# Patient Record
Sex: Female | Born: 1950 | Race: White | Hispanic: No | Marital: Married | Smoking: Never smoker
Health system: Southern US, Community
[De-identification: ages and names within clinical notes are randomized; demographics above are authoritative.]

## PROBLEM LIST (undated history)

## (undated) DIAGNOSIS — J189 Pneumonia, unspecified organism: Secondary | ICD-10-CM

## (undated) DIAGNOSIS — C801 Malignant (primary) neoplasm, unspecified: Secondary | ICD-10-CM

## (undated) DIAGNOSIS — D701 Agranulocytosis secondary to cancer chemotherapy: Secondary | ICD-10-CM

## (undated) DIAGNOSIS — I10 Essential (primary) hypertension: Secondary | ICD-10-CM

## (undated) DIAGNOSIS — E669 Obesity, unspecified: Secondary | ICD-10-CM

## (undated) DIAGNOSIS — C569 Malignant neoplasm of unspecified ovary: Secondary | ICD-10-CM

## (undated) DIAGNOSIS — J45909 Unspecified asthma, uncomplicated: Secondary | ICD-10-CM

## (undated) DIAGNOSIS — Z95828 Presence of other vascular implants and grafts: Secondary | ICD-10-CM

## (undated) DIAGNOSIS — T451X5A Adverse effect of antineoplastic and immunosuppressive drugs, initial encounter: Secondary | ICD-10-CM

## (undated) DIAGNOSIS — C541 Malignant neoplasm of endometrium: Secondary | ICD-10-CM

## (undated) DIAGNOSIS — R569 Unspecified convulsions: Secondary | ICD-10-CM

## (undated) DIAGNOSIS — G629 Polyneuropathy, unspecified: Secondary | ICD-10-CM

## (undated) DIAGNOSIS — R232 Flushing: Secondary | ICD-10-CM

## (undated) DIAGNOSIS — D61818 Other pancytopenia: Secondary | ICD-10-CM

## (undated) DIAGNOSIS — D649 Anemia, unspecified: Secondary | ICD-10-CM

## (undated) DIAGNOSIS — E119 Type 2 diabetes mellitus without complications: Secondary | ICD-10-CM

## (undated) DIAGNOSIS — D6959 Other secondary thrombocytopenia: Secondary | ICD-10-CM

## (undated) DIAGNOSIS — I739 Peripheral vascular disease, unspecified: Secondary | ICD-10-CM

## (undated) HISTORY — DX: Adverse effect of antineoplastic and immunosuppressive drugs, initial encounter: D70.1

## (undated) HISTORY — DX: Malignant neoplasm of unspecified ovary: C56.9

## (undated) HISTORY — DX: Flushing: R23.2

## (undated) HISTORY — DX: Essential (primary) hypertension: I10

## (undated) HISTORY — DX: Other pancytopenia: D61.818

## (undated) HISTORY — DX: Type 2 diabetes mellitus without complications: E11.9

## (undated) HISTORY — DX: Polyneuropathy, unspecified: G62.9

## (undated) HISTORY — DX: Other secondary thrombocytopenia: D69.59

## (undated) HISTORY — DX: Obesity, unspecified: E66.9

## (undated) HISTORY — DX: Anemia, unspecified: D64.9

## (undated) HISTORY — DX: Adverse effect of antineoplastic and immunosuppressive drugs, initial encounter: T45.1X5A

## (undated) HISTORY — DX: Presence of other vascular implants and grafts: Z95.828

## (undated) HISTORY — DX: Peripheral vascular disease, unspecified: I73.9

## (undated) HISTORY — DX: Malignant (primary) neoplasm, unspecified: C80.1

## (undated) HISTORY — DX: Malignant neoplasm of endometrium: C54.1

---

## 2012-07-05 HISTORY — PX: ABDOMINAL HYSTERECTOMY: SHX81

## 2013-04-30 DIAGNOSIS — C569 Malignant neoplasm of unspecified ovary: Secondary | ICD-10-CM

## 2013-04-30 DIAGNOSIS — I10 Essential (primary) hypertension: Secondary | ICD-10-CM | POA: Insufficient documentation

## 2013-04-30 DIAGNOSIS — E119 Type 2 diabetes mellitus without complications: Secondary | ICD-10-CM | POA: Insufficient documentation

## 2013-04-30 DIAGNOSIS — D62 Acute posthemorrhagic anemia: Secondary | ICD-10-CM | POA: Insufficient documentation

## 2013-04-30 DIAGNOSIS — R109 Unspecified abdominal pain: Secondary | ICD-10-CM | POA: Insufficient documentation

## 2013-04-30 HISTORY — DX: Malignant neoplasm of unspecified ovary: C56.9

## 2013-05-07 DIAGNOSIS — E669 Obesity, unspecified: Secondary | ICD-10-CM | POA: Insufficient documentation

## 2013-06-11 DIAGNOSIS — C541 Malignant neoplasm of endometrium: Secondary | ICD-10-CM | POA: Insufficient documentation

## 2013-07-05 DIAGNOSIS — J189 Pneumonia, unspecified organism: Secondary | ICD-10-CM

## 2013-07-05 HISTORY — DX: Pneumonia, unspecified organism: J18.9

## 2013-08-24 DIAGNOSIS — T451X5A Adverse effect of antineoplastic and immunosuppressive drugs, initial encounter: Secondary | ICD-10-CM

## 2013-08-24 DIAGNOSIS — D701 Agranulocytosis secondary to cancer chemotherapy: Secondary | ICD-10-CM | POA: Insufficient documentation

## 2013-12-04 DIAGNOSIS — D6959 Other secondary thrombocytopenia: Secondary | ICD-10-CM | POA: Insufficient documentation

## 2013-12-04 DIAGNOSIS — T50905A Adverse effect of unspecified drugs, medicaments and biological substances, initial encounter: Secondary | ICD-10-CM

## 2013-12-04 DIAGNOSIS — G629 Polyneuropathy, unspecified: Secondary | ICD-10-CM | POA: Insufficient documentation

## 2014-04-18 DIAGNOSIS — Z95828 Presence of other vascular implants and grafts: Secondary | ICD-10-CM | POA: Insufficient documentation

## 2014-12-09 DIAGNOSIS — D61818 Other pancytopenia: Secondary | ICD-10-CM | POA: Insufficient documentation

## 2015-03-11 DIAGNOSIS — IMO0001 Reserved for inherently not codable concepts without codable children: Secondary | ICD-10-CM | POA: Insufficient documentation

## 2015-04-10 DIAGNOSIS — Z23 Encounter for immunization: Secondary | ICD-10-CM | POA: Diagnosis not present

## 2015-04-10 DIAGNOSIS — E119 Type 2 diabetes mellitus without complications: Secondary | ICD-10-CM | POA: Diagnosis not present

## 2015-04-10 DIAGNOSIS — E559 Vitamin D deficiency, unspecified: Secondary | ICD-10-CM | POA: Diagnosis not present

## 2015-04-10 DIAGNOSIS — I10 Essential (primary) hypertension: Secondary | ICD-10-CM | POA: Diagnosis not present

## 2015-04-10 DIAGNOSIS — C569 Malignant neoplasm of unspecified ovary: Secondary | ICD-10-CM | POA: Diagnosis not present

## 2015-04-10 DIAGNOSIS — E782 Mixed hyperlipidemia: Secondary | ICD-10-CM | POA: Diagnosis not present

## 2015-04-10 DIAGNOSIS — C541 Malignant neoplasm of endometrium: Secondary | ICD-10-CM | POA: Diagnosis not present

## 2015-04-10 DIAGNOSIS — R5382 Chronic fatigue, unspecified: Secondary | ICD-10-CM | POA: Diagnosis not present

## 2015-04-10 DIAGNOSIS — G629 Polyneuropathy, unspecified: Secondary | ICD-10-CM | POA: Diagnosis not present

## 2015-04-22 DIAGNOSIS — Z95828 Presence of other vascular implants and grafts: Secondary | ICD-10-CM | POA: Diagnosis not present

## 2015-06-10 DIAGNOSIS — C569 Malignant neoplasm of unspecified ovary: Secondary | ICD-10-CM | POA: Diagnosis not present

## 2015-06-10 DIAGNOSIS — M25551 Pain in right hip: Secondary | ICD-10-CM | POA: Diagnosis not present

## 2015-06-10 DIAGNOSIS — G8929 Other chronic pain: Secondary | ICD-10-CM | POA: Diagnosis not present

## 2015-06-10 DIAGNOSIS — C541 Malignant neoplasm of endometrium: Secondary | ICD-10-CM | POA: Diagnosis not present

## 2015-07-22 DIAGNOSIS — Z95828 Presence of other vascular implants and grafts: Secondary | ICD-10-CM | POA: Diagnosis not present

## 2015-08-28 DIAGNOSIS — C569 Malignant neoplasm of unspecified ovary: Secondary | ICD-10-CM | POA: Diagnosis not present

## 2015-08-28 DIAGNOSIS — C541 Malignant neoplasm of endometrium: Secondary | ICD-10-CM | POA: Diagnosis not present

## 2015-09-02 DIAGNOSIS — E1165 Type 2 diabetes mellitus with hyperglycemia: Secondary | ICD-10-CM | POA: Diagnosis not present

## 2015-09-02 DIAGNOSIS — E1142 Type 2 diabetes mellitus with diabetic polyneuropathy: Secondary | ICD-10-CM | POA: Diagnosis not present

## 2015-09-02 DIAGNOSIS — Z95828 Presence of other vascular implants and grafts: Secondary | ICD-10-CM | POA: Diagnosis not present

## 2015-09-02 DIAGNOSIS — D6959 Other secondary thrombocytopenia: Secondary | ICD-10-CM | POA: Diagnosis not present

## 2015-09-02 DIAGNOSIS — T451X5A Adverse effect of antineoplastic and immunosuppressive drugs, initial encounter: Secondary | ICD-10-CM | POA: Diagnosis not present

## 2015-09-02 DIAGNOSIS — C541 Malignant neoplasm of endometrium: Secondary | ICD-10-CM | POA: Diagnosis not present

## 2015-09-02 DIAGNOSIS — G6289 Other specified polyneuropathies: Secondary | ICD-10-CM | POA: Diagnosis not present

## 2015-09-02 DIAGNOSIS — C569 Malignant neoplasm of unspecified ovary: Secondary | ICD-10-CM | POA: Diagnosis not present

## 2015-09-24 DIAGNOSIS — E119 Type 2 diabetes mellitus without complications: Secondary | ICD-10-CM | POA: Diagnosis not present

## 2015-09-24 DIAGNOSIS — I10 Essential (primary) hypertension: Secondary | ICD-10-CM | POA: Diagnosis not present

## 2015-09-24 DIAGNOSIS — C541 Malignant neoplasm of endometrium: Secondary | ICD-10-CM | POA: Diagnosis not present

## 2015-09-24 DIAGNOSIS — E782 Mixed hyperlipidemia: Secondary | ICD-10-CM | POA: Diagnosis not present

## 2015-09-30 DIAGNOSIS — C569 Malignant neoplasm of unspecified ovary: Secondary | ICD-10-CM | POA: Diagnosis not present

## 2015-09-30 DIAGNOSIS — E782 Mixed hyperlipidemia: Secondary | ICD-10-CM | POA: Diagnosis not present

## 2015-09-30 DIAGNOSIS — C541 Malignant neoplasm of endometrium: Secondary | ICD-10-CM | POA: Diagnosis not present

## 2015-09-30 DIAGNOSIS — N182 Chronic kidney disease, stage 2 (mild): Secondary | ICD-10-CM | POA: Diagnosis not present

## 2015-09-30 DIAGNOSIS — E1165 Type 2 diabetes mellitus with hyperglycemia: Secondary | ICD-10-CM | POA: Diagnosis not present

## 2015-09-30 DIAGNOSIS — E1122 Type 2 diabetes mellitus with diabetic chronic kidney disease: Secondary | ICD-10-CM | POA: Diagnosis not present

## 2015-09-30 DIAGNOSIS — E1143 Type 2 diabetes mellitus with diabetic autonomic (poly)neuropathy: Secondary | ICD-10-CM | POA: Diagnosis not present

## 2015-10-14 DIAGNOSIS — Z95828 Presence of other vascular implants and grafts: Secondary | ICD-10-CM | POA: Diagnosis not present

## 2015-11-27 DIAGNOSIS — C569 Malignant neoplasm of unspecified ovary: Secondary | ICD-10-CM | POA: Diagnosis not present

## 2015-12-08 DIAGNOSIS — E1165 Type 2 diabetes mellitus with hyperglycemia: Secondary | ICD-10-CM | POA: Diagnosis not present

## 2015-12-08 DIAGNOSIS — Z452 Encounter for adjustment and management of vascular access device: Secondary | ICD-10-CM | POA: Diagnosis not present

## 2015-12-08 DIAGNOSIS — C541 Malignant neoplasm of endometrium: Secondary | ICD-10-CM | POA: Diagnosis not present

## 2015-12-08 DIAGNOSIS — C569 Malignant neoplasm of unspecified ovary: Secondary | ICD-10-CM | POA: Diagnosis not present

## 2015-12-08 DIAGNOSIS — G620 Drug-induced polyneuropathy: Secondary | ICD-10-CM | POA: Diagnosis not present

## 2015-12-08 DIAGNOSIS — R2231 Localized swelling, mass and lump, right upper limb: Secondary | ICD-10-CM | POA: Diagnosis not present

## 2015-12-08 DIAGNOSIS — Z95828 Presence of other vascular implants and grafts: Secondary | ICD-10-CM | POA: Diagnosis not present

## 2015-12-08 DIAGNOSIS — E1142 Type 2 diabetes mellitus with diabetic polyneuropathy: Secondary | ICD-10-CM | POA: Diagnosis not present

## 2015-12-08 DIAGNOSIS — R2230 Localized swelling, mass and lump, unspecified upper limb: Secondary | ICD-10-CM | POA: Insufficient documentation

## 2015-12-17 DIAGNOSIS — C541 Malignant neoplasm of endometrium: Secondary | ICD-10-CM | POA: Diagnosis not present

## 2015-12-17 DIAGNOSIS — N6489 Other specified disorders of breast: Secondary | ICD-10-CM | POA: Diagnosis not present

## 2015-12-17 DIAGNOSIS — C569 Malignant neoplasm of unspecified ovary: Secondary | ICD-10-CM | POA: Diagnosis not present

## 2015-12-17 DIAGNOSIS — N63 Unspecified lump in breast: Secondary | ICD-10-CM | POA: Diagnosis not present

## 2016-01-15 ENCOUNTER — Encounter (HOSPITAL_COMMUNITY): Payer: Medicare HMO | Attending: Hematology | Admitting: Hematology

## 2016-01-15 ENCOUNTER — Encounter (HOSPITAL_COMMUNITY): Payer: Self-pay | Admitting: Hematology

## 2016-01-15 VITALS — BP 155/55 | HR 96 | Temp 98.2°F | Resp 18 | Wt 251.6 lb

## 2016-01-15 DIAGNOSIS — G620 Drug-induced polyneuropathy: Secondary | ICD-10-CM

## 2016-01-15 DIAGNOSIS — C541 Malignant neoplasm of endometrium: Secondary | ICD-10-CM | POA: Diagnosis not present

## 2016-01-15 DIAGNOSIS — Z95828 Presence of other vascular implants and grafts: Secondary | ICD-10-CM

## 2016-01-15 DIAGNOSIS — C569 Malignant neoplasm of unspecified ovary: Secondary | ICD-10-CM

## 2016-01-15 DIAGNOSIS — T451X5A Adverse effect of antineoplastic and immunosuppressive drugs, initial encounter: Secondary | ICD-10-CM

## 2016-01-15 DIAGNOSIS — Z452 Encounter for adjustment and management of vascular access device: Secondary | ICD-10-CM | POA: Diagnosis not present

## 2016-01-15 MED ORDER — HEPARIN SOD (PORK) LOCK FLUSH 100 UNIT/ML IV SOLN
500.0000 [IU] | Freq: Once | INTRAVENOUS | Status: AC
  Administered 2016-01-15: 500 [IU] via INTRAVENOUS
  Filled 2016-01-15: qty 5

## 2016-01-15 MED ORDER — SODIUM CHLORIDE 0.9% FLUSH
10.0000 mL | INTRAVENOUS | Status: DC | PRN
  Administered 2016-01-15: 10 mL via INTRAVENOUS
  Filled 2016-01-15: qty 10

## 2016-01-15 NOTE — Patient Instructions (Signed)
[place] at [facility] Discharge Instructions  RECOMMENDATIONS MADE BY THE CONSULTANT AND ANY TEST RESULTS WILL BE SENT TO YOUR REFERRING PHYSICIAN.  You were seen by Dr. Irene Limbo today. You will Return to center in 6 to 8 weeks. PET scan in 6 weeks Labs in 6 weeks Port Flush in 6 weeks [employer] today. Please call the center with any concerns.  Thank you for choosing [place] at [facility] to provide your oncology and hematology care.  To afford each patient quality time with our provider, please arrive at least 15 minutes before your scheduled appointment time.   Beginning January 23rd 2017 lab work for the [facility] will be done in the  Main lab at [facility] on 1st floor. If you have a lab appointment with the [facility] please come in thru the  Main Entrance and check in at the main information desk  You need to re-schedule your appointment should you arrive 10 or more minutes late.  We strive to give you quality time with our providers, and arriving late affects you and other patients whose appointments are after yours.  Also, if you no show three or more times for appointments you may be dismissed from the clinic at the providers discretion.     Again, thank you for choosing [facility].  Our hope is that these requests will decrease the amount of time that you wait before being seen by our physicians.       _____________________________________________________________  Should you have questions after your visit to [facility], please contact our office at [phone] between the hours of 8:30 a.m. and 4:30 p.m.  Voicemails left after 4:30 p.m. will not be returned until the following business day.  For prescription refill requests, have your pharmacy contact our office.         Resources For Cancer Patients and their Caregivers ? American Cancer Society: Can assist with transportation, wigs,  general needs, runs Look Good Feel Better.        ([phone] 816-5178 ? Cancer Care: Provides financial assistance, online support groups, medication/co-pay assistance.  1-800-813-HOPE 4452404229) ? [place] Assists [place] cancer patients and their families through emotional , educational and financial support.  8560347402 ? [place] DSS Where to apply for food stamps, Medicaid and utility assistance. 684 317 7282 ? RCATS: Transportation to medical appointments. (574)499-6555 ? Social Security Administration: May apply for disability if have a Stage IV cancer. (785) 203-6738 610 531 7315 ? [employer], Disability and Transit Services: Assists with nutrition, care and transit needs. [place] Support Programs: @10RELATIVEDAYS @ > Cancer Support Group  2nd Tuesday of the month 1pm-2pm, Journey Room  > Creative Journey  3rd Tuesday of the month 1130am-1pm, Journey Room  > Look Good Feel Better  1st Wednesday of the month 10am-12 noon, Journey Room (Call [place] to register [phone])

## 2016-01-15 NOTE — Progress Notes (Signed)
Kathleen Whitehead presented for Portacath access and flush. Portacath located in the right chest wall accessed with  H 20 needle. Clean, dry and intact No blood return noted. Pt states recent appointments has had no blood return per port. Flushed well, no resistance.  Portacath flushed with 51ml NS and 500U/16ml Heparin per protocol and needle removed intact. Procedure without incident. Patient tolerated procedure well.

## 2016-01-23 DIAGNOSIS — N182 Chronic kidney disease, stage 2 (mild): Secondary | ICD-10-CM | POA: Diagnosis not present

## 2016-01-23 DIAGNOSIS — E782 Mixed hyperlipidemia: Secondary | ICD-10-CM | POA: Diagnosis not present

## 2016-01-23 DIAGNOSIS — E1143 Type 2 diabetes mellitus with diabetic autonomic (poly)neuropathy: Secondary | ICD-10-CM | POA: Diagnosis not present

## 2016-01-23 DIAGNOSIS — E1122 Type 2 diabetes mellitus with diabetic chronic kidney disease: Secondary | ICD-10-CM | POA: Diagnosis not present

## 2016-01-23 DIAGNOSIS — I10 Essential (primary) hypertension: Secondary | ICD-10-CM | POA: Diagnosis not present

## 2016-01-28 DIAGNOSIS — C569 Malignant neoplasm of unspecified ovary: Secondary | ICD-10-CM | POA: Diagnosis not present

## 2016-01-28 DIAGNOSIS — E782 Mixed hyperlipidemia: Secondary | ICD-10-CM | POA: Diagnosis not present

## 2016-01-28 DIAGNOSIS — C541 Malignant neoplasm of endometrium: Secondary | ICD-10-CM | POA: Diagnosis not present

## 2016-01-28 DIAGNOSIS — E1165 Type 2 diabetes mellitus with hyperglycemia: Secondary | ICD-10-CM | POA: Diagnosis not present

## 2016-01-28 DIAGNOSIS — Z6838 Body mass index (BMI) 38.0-38.9, adult: Secondary | ICD-10-CM | POA: Diagnosis not present

## 2016-01-28 DIAGNOSIS — E1143 Type 2 diabetes mellitus with diabetic autonomic (poly)neuropathy: Secondary | ICD-10-CM | POA: Diagnosis not present

## 2016-01-28 DIAGNOSIS — E1122 Type 2 diabetes mellitus with diabetic chronic kidney disease: Secondary | ICD-10-CM | POA: Diagnosis not present

## 2016-02-03 ENCOUNTER — Telehealth (HOSPITAL_COMMUNITY): Payer: Self-pay | Admitting: Oncology

## 2016-02-03 DIAGNOSIS — C569 Malignant neoplasm of unspecified ovary: Secondary | ICD-10-CM

## 2016-02-03 MED ORDER — OXYCODONE-ACETAMINOPHEN 10-325 MG PO TABS: 1.0000 | ORAL_TABLET | ORAL | 0 refills | Status: DC | PRN

## 2016-02-03 NOTE — Telephone Encounter (Signed)
Patient is calling requesting a refill of her Percocet.  Using the [place] Controlled Substance Reporting System, I have looked up her pain medication needs.  She has been receiving #180 of Percocet 10-325 per month.  Her last refill was on 01/05/2016.  She is due for a refill.    HOWEVER, #180 is a large quantity and therefore, when she is seen here at [facility] to establish her care, we will need to discuss her pain medication needs and regimen.  I feel uneasy about prescribing such large quantities, particularly in light of no known long-acting pain medication as part of her regimen.  Robynn Pane, PA-C 02/03/2016 5:12 PM

## 2016-02-25 ENCOUNTER — Encounter (HOSPITAL_COMMUNITY): Payer: Medicare HMO

## 2016-02-27 ENCOUNTER — Encounter (HOSPITAL_COMMUNITY): Payer: Self-pay

## 2016-02-27 ENCOUNTER — Encounter (HOSPITAL_COMMUNITY): Payer: Medicare HMO | Attending: Hematology

## 2016-02-27 ENCOUNTER — Ambulatory Visit (HOSPITAL_COMMUNITY): Payer: Medicare HMO | Admitting: Hematology & Oncology

## 2016-02-27 VITALS — BP 154/58 | HR 96 | Temp 97.8°F | Resp 18

## 2016-02-27 DIAGNOSIS — C541 Malignant neoplasm of endometrium: Secondary | ICD-10-CM | POA: Diagnosis not present

## 2016-02-27 DIAGNOSIS — Z95828 Presence of other vascular implants and grafts: Secondary | ICD-10-CM | POA: Diagnosis not present

## 2016-02-27 DIAGNOSIS — C569 Malignant neoplasm of unspecified ovary: Secondary | ICD-10-CM | POA: Insufficient documentation

## 2016-02-27 LAB — CBC WITH DIFFERENTIAL/PLATELET
BASOS PCT: 0 %
Basophils Absolute: 0 10*3/uL (ref 0.0–0.1)
EOS ABS: 0.1 10*3/uL (ref 0.0–0.7)
Eosinophils Relative: 1 %
HEMATOCRIT: 39.1 % (ref 36.0–46.0)
HEMOGLOBIN: 13.3 g/dL (ref 12.0–15.0)
LYMPHS ABS: 2.5 10*3/uL (ref 0.7–4.0)
Lymphocytes Relative: 36 %
MCH: 31.8 pg (ref 26.0–34.0)
MCHC: 34 g/dL (ref 30.0–36.0)
MCV: 93.5 fL (ref 78.0–100.0)
MONO ABS: 0.5 10*3/uL (ref 0.1–1.0)
MONOS PCT: 7 %
NEUTROS PCT: 56 %
Neutro Abs: 3.9 10*3/uL (ref 1.7–7.7)
Platelets: 158 10*3/uL (ref 150–400)
RBC: 4.18 MIL/uL (ref 3.87–5.11)
RDW: 12.7 % (ref 11.5–15.5)
WBC: 7 10*3/uL (ref 4.0–10.5)

## 2016-02-27 LAB — COMPREHENSIVE METABOLIC PANEL
ALK PHOS: 60 U/L (ref 38–126)
ALT: 21 U/L (ref 14–54)
ANION GAP: 8 (ref 5–15)
AST: 26 U/L (ref 15–41)
Albumin: 3.3 g/dL — ABNORMAL LOW (ref 3.5–5.0)
BILIRUBIN TOTAL: 0.4 mg/dL (ref 0.3–1.2)
BUN: 17 mg/dL (ref 6–20)
CALCIUM: 9 mg/dL (ref 8.9–10.3)
CO2: 24 mmol/L (ref 22–32)
Chloride: 105 mmol/L (ref 101–111)
Creatinine, Ser: 1.07 mg/dL — ABNORMAL HIGH (ref 0.44–1.00)
GFR calc non Af Amer: 53 mL/min — ABNORMAL LOW (ref >60)
Glucose, Bld: 116 mg/dL — ABNORMAL HIGH (ref 65–99)
POTASSIUM: 4.1 mmol/L (ref 3.5–5.1)
SODIUM: 137 mmol/L (ref 135–145)
TOTAL PROTEIN: 6.7 g/dL (ref 6.5–8.1)

## 2016-02-27 MED ORDER — HEPARIN SOD (PORK) LOCK FLUSH 100 UNIT/ML IV SOLN
500.0000 [IU] | Freq: Once | INTRAVENOUS | Status: AC
  Administered 2016-02-27: 500 [IU] via INTRAVENOUS

## 2016-02-27 MED ORDER — SODIUM CHLORIDE 0.9% FLUSH
10.0000 mL | INTRAVENOUS | Status: DC | PRN
  Administered 2016-02-27: 10 mL via INTRAVENOUS
  Filled 2016-02-27: qty 10

## 2016-02-27 NOTE — Patient Instructions (Signed)
[place] at [facility] Discharge Instructions  RECOMMENDATIONS MADE BY THE CONSULTANT AND ANY TEST RESULTS WILL BE SENT TO YOUR REFERRING PHYSICIAN.  You got your port flushed today and labs drawn. Follow up as scheduled.  Thank you for choosing [place] at [facility] to provide your oncology and hematology care.  To afford each patient quality time with our provider, please arrive at least 15 minutes before your scheduled appointment time.   Beginning January 23rd 2017 lab work for the [facility] will be done in the  Main lab at [facility] on 1st floor. If you have a lab appointment with the [place] please come in thru the  Main Entrance and check in at the main information desk  You need to re-schedule your appointment should you arrive 10 or more minutes late.  We strive to give you quality time with our providers, and arriving late affects you and other patients whose appointments are after yours.  Also, if you no show three or more times for appointments you may be dismissed from the clinic at the providers discretion.     Again, thank you for choosing [facility].  Our hope is that these requests will decrease the amount of time that you wait before being seen by our physicians.       _____________________________________________________________  Should you have questions after your visit to [facility], please contact our office at [phone] between the hours of 8:30 a.m. and 4:30 p.m.  Voicemails left after 4:30 p.m. will not be returned until the following business day.  For prescription refill requests, have your pharmacy contact our office.         Resources For Cancer Patients and their Caregivers ? American Cancer Society: Can assist with transportation, wigs, general needs, runs Look Good Feel Better.        828-625-5119 ? Cancer Care: Provides financial assistance, online  support groups, medication/co-pay assistance.  1-800-813-HOPE 669-860-3170) ? [place] Assists [place] cancer patients and their families through emotional , educational and financial support.  813-888-5272 ? [place] DSS Where to apply for food stamps, Medicaid and utility assistance. 470 166 2000 ? RCATS: Transportation to medical appointments. (503)510-8310 ? Social Security Administration: May apply for disability if have a Stage IV cancer. 272-287-8844 802-829-3443 ? [employer], Disability and Transit Services: Assists with nutrition, care and transit needs. [place] Support Programs: @10RELATIVEDAYS @ > Cancer Support Group  2nd Tuesday of the month 1pm-2pm, Journey Room  > Creative Journey  3rd Tuesday of the month 1130am-1pm, Journey Room  > Look Good Feel Better  1st Wednesday of the month 10am-12 noon, Journey Room (Call [place] to register [phone])

## 2016-02-27 NOTE — Progress Notes (Signed)
Kathleen Whitehead presented for Portacath access and flush. Portacath located rightchest wall accessed with  H 20 needle. Good blood return present. Portacath flushed with 6ml NS and 500U/52ml Heparin and needle removed intact. Procedure without incident. Patient tolerated procedure well.  Labs drawn as ordered.

## 2016-02-28 LAB — CA 125: CA 125: 19.7 U/mL (ref 0.0–38.1)

## 2016-03-02 ENCOUNTER — Ambulatory Visit (HOSPITAL_COMMUNITY): Payer: Medicare HMO | Admitting: Hematology & Oncology

## 2016-03-02 ENCOUNTER — Encounter (HOSPITAL_BASED_OUTPATIENT_CLINIC_OR_DEPARTMENT_OTHER): Payer: Medicare HMO | Admitting: Oncology

## 2016-03-02 ENCOUNTER — Encounter (HOSPITAL_COMMUNITY): Payer: Self-pay | Admitting: Oncology

## 2016-03-02 VITALS — BP 152/52 | HR 81 | Temp 98.3°F | Resp 20 | Ht 69.0 in | Wt 249.0 lb

## 2016-03-02 DIAGNOSIS — C569 Malignant neoplasm of unspecified ovary: Secondary | ICD-10-CM

## 2016-03-02 MED ORDER — OXYCODONE HCL ER 20 MG PO T12A: 20.0000 mg | EXTENDED_RELEASE_TABLET | Freq: Two times a day (BID) | ORAL | 0 refills | Status: DC

## 2016-03-02 MED ORDER — OXYCODONE-ACETAMINOPHEN 10-325 MG PO TABS: 1.0000 | ORAL_TABLET | ORAL | 0 refills | Status: DC | PRN

## 2016-03-02 NOTE — Patient Instructions (Signed)
[place] at [facility] Discharge Instructions  RECOMMENDATIONS MADE BY THE CONSULTANT AND ANY TEST RESULTS WILL BE SENT TO YOUR REFERRING PHYSICIAN.  You were seen by Kirby Crigler PA-C today. He has given you a prescription for both percocet and Oxycontin. We will see you back in about 6 weeks for a port flush and then every 6 weeks for a port flush and labs. Return in 3 months for follow up .   Thank you for choosing [place] at [facility] to provide your oncology and hematology care.  To afford each patient quality time with our provider, please arrive at least 15 minutes before your scheduled appointment time.   Beginning January 23rd 2017 lab work for the [facility] will be done in the  Main lab at [facility] on 1st floor. If you have a lab appointment with the [place] please come in thru the  Main Entrance and check in at the main information desk  You need to re-schedule your appointment should you arrive 10 or more minutes late.  We strive to give you quality time with our providers, and arriving late affects you and other patients whose appointments are after yours.  Also, if you no show three or more times for appointments you may be dismissed from the clinic at the providers discretion.     Again, thank you for choosing [facility].  Our hope is that these requests will decrease the amount of time that you wait before being seen by our physicians.       _____________________________________________________________  Should you have questions after your visit to [facility], please contact our office at [phone] between the hours of 8:30 a.m. and 4:30 p.m.  Voicemails left after 4:30 p.m. will not be returned until the following business day.  For prescription refill requests, have your pharmacy contact our office.         Resources For Cancer Patients and their Caregivers ? American  Cancer Society: Can assist with transportation, wigs, general needs, runs Look Good Feel Better.        ([phone] ? Cancer Care: Provides financial assistance, online support groups, medication/co-pay assistance.  1-800-813-HOPE 605 272 9609) ? [place] Assists [place] cancer patients and their families through emotional , educational and financial support.  (337) 491-2296 ? [place] DSS Where to apply for food stamps, Medicaid and utility assistance. 914-629-2621 ? RCATS: Transportation to medical appointments. 407-137-6746 ? Social Security Administration: May apply for disability if have a Stage IV cancer. 475-706-6620 319-461-7423 ? [employer], Disability and Transit Services: Assists with nutrition, care and transit needs. [place] Support Programs: @10RELATIVEDAYS @ > Cancer Support Group  2nd Tuesday of the month 1pm-2pm, Journey Room  > Creative Journey  3rd Tuesday of the month 1130am-1pm, Journey Room  > Look Good Feel Better  1st Wednesday of the month 10am-12 noon, Journey Room (Call [place] to register [phone])

## 2016-03-02 NOTE — Assessment & Plan Note (Addendum)
Ovarian Cancer/Endometrial cancer, S/P TAH-BSO by Dr. [name] with unsure pathology regarding one primary with metastatic disease versus two separate primaries followed by Carboplatin/Taxol x 5/6 cycles (last cycle cancelled due to intolerance and thrombocytopenia), followed by EBRT and intravaginal brachytherapy.  She did have a recurrence in October 2015 on PET imaging requiring another treatment with EBRT.  Finishing all therapy on 07/15/2014.  Pre-operative CA 125 was elevated.  Oncology history developed.  Patient originally seen by Dr. Irene Limbo at time of medical oncology transfer of care in July 2017.  At this time, his note is not yet complete.  Labs performed on 02/27/2016: CBC diff, CMET, CA 125.  I personally reviewed and went over laboratory results with the patient.  The results are noted within this dictation.  I have recommended a referral to [facility], but she declines.    She is NOT up to date on her colonoscopy by the patient's own admission.  She will be referred to survivorship program as well and we will share in the patient's surveillance.  Labs in 3 months: CBC diff, CMET, CA 125.  Imaging will be ordered if/when clinically indicated.  We discussed her pain medication needs.  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  She has been maintained on #180 Percocet by Dr. Tressie Stalker.  [place] Controlled Substance Reporting System is reviewed regarding her controlled substance prescribing.  She will be due next week for a refill on her pain medication.  I have reviewed long-acting pain medication.  She is provided education regarding the role of a long-acting medication.  Since she has been on Oxycodone based therapy for some time, we will try to get Oxycontin approved for her.  Rx is printed for Oxycontin 20 mg BID.  The goal of therapy is to decrease her short-acting pain medication needs.  Return in 3 months for follow-up.

## 2016-03-02 NOTE — Progress Notes (Signed)
Kathleen Labrum, MD [place] [address]  Malignant neoplasm of ovary, unspecified laterality ([place]) - Plan: glipiZIDE (GLUCOTROL) 10 MG tablet, VICTOZA 18 MG/3ML SOPN, oxyCODONE-acetaminophen (PERCOCET) 10-325 MG tablet, oxyCODONE (OXYCONTIN) 20 mg 12 hr tablet, CBC with Differential, Comprehensive metabolic panel, CA 0000000  CURRENT THERAPY: Surveillance per NCCN guidelines  INTERVAL HISTORY: Kathleen Whitehead 65 y.o. female returns for followup of Ovarian Cancer/Endometrial cancer, S/P TAH-BSO by Dr. [name] with unsure pathology regarding one primary with metastatic disease versus two separate primaries followed by Carboplatin/Taxol x 5/6 cycles (last cycle cancelled due to intolerance and thrombocytopenia), followed by EBRT and intravaginal brachytherapy.  She did have a recurrence in October 2015 on PET imaging requiring another treatment with EBRT.  Finishing all therapy on 07/15/2014.  Pre-operative CA 125 was elevated.     Malignant neoplasm of ovary ([place])   04/30/2013 Initial Diagnosis    Ovarian cancer/uterine cancer status post TAH-BSO by Dr. [name] and associates, pathology could not be certain that these were not 2 separate primaries rather than one metastatic process from the uterus to the ovary.      08/15/2013 - 12/06/2013 Chemotherapy    Carboplatin/Taxol with dose reduction of 50% and Taxol due to grade 2+ peripheral neuropathy and gabapentin induced nightmare/hallucinations. 5 out of 6 cycles given with the final cycle being cancelled secondary to significant thrombocytopenia/intolerance.      09/10/2013 - 10/17/2013 Radiation Therapy    5040 cGy delivered by EBRT followed by intravaginal brachytherapy.      04/30/2014 PET scan    PET scan consistent with recurrent disease of either ovarian cancer or endometrial cancer.      06/03/2014 - 07/15/2014 Radiation Therapy    5040 cGy over 28 fractions delivered to the low periaortic area lymph node  either metastatic endometrial or metastatic or ovarian cancer.      07/19/2014 Imaging    Imaging concerning for new mesenteric disease and subdiaphragmatic disease.      08/05/2014 PET scan    No evidence of progression of disease.      08/05/2014 Remission    Negative PET scan.      Today, she denies any complaints.  I have reviewed her medical oncology chart via [place].  We discussed her pain medication needs.  She is on Percocet by Dr. Tressie Stalker, but the quantity of tablets is excessive and therefore indicative of poor pain control.  She is open to learning of long-acting pain medication options.    She is not up to date on important preventative health maintenance including bone density and colonoscopy and refuses these at this time.  She does not ambulate much, not even at home.  She is very inactive at home as well.  Review of Systems  Constitutional: Negative.  Negative for chills, fever and weight loss.  HENT: Negative.   Eyes: Negative.   Respiratory: Negative.   Cardiovascular: Negative.   Gastrointestinal: Negative.  Negative for abdominal pain, nausea and vomiting.  Genitourinary: Negative.   Musculoskeletal: Negative.   Skin: Negative.   Neurological: Positive for tingling. Negative for weakness.  Endo/Heme/Allergies: Negative.   Psychiatric/Behavioral: Negative.     Past Medical History:  Diagnosis Date  . Anemia   . Chemotherapy induced neutropenia ([place])   . Chemotherapy induced thrombocytopenia   . Diabetes mellitus without complication ([place])   . Endometrial cancer ([place])   . Hot flashes   . Hypertension   . Malignant neoplasm (  [place])   . Malignant neoplasm of ovary ([place]) 04/30/2013  . Obesity   . Ovarian cancer ([place])   . Pancytopenia ([place])   . Peripheral artery disease ([place])   . Peripheral neuropathy ([place])   . Port-a-cath in place     History reviewed. No pertinent surgical history.  Family History  Problem Relation Age of Onset  . Heart  failure Mother   . Stroke Sister   . Cancer Sister   . Diabetes Sister   . Leukemia Sister   . Diabetes Brother     Social History   Social History  . Marital status: Married    Spouse name: N/A  . Number of children: N/A  . Years of education: N/A   Social History Main Topics  . Smoking status: Never Smoker  . Smokeless tobacco: Never Used  . Alcohol use No  . Drug use: No  . Sexual activity: Not Asked   Other Topics Concern  . None   Social History Narrative  . None     PHYSICAL EXAMINATION  ECOG PERFORMANCE STATUS: 2 - Symptomatic, <50% confined to bed  Vitals:   03/02/16 1320  BP: (!) 152/52  Pulse: 81  Resp: 20  Temp: 98.3 F (36.8 C)    GENERAL:alert, no distress, comfortable, cooperative, obese, smiling and accompanied by her husband SKIN: skin color, texture, turgor are normal, no rashes or significant lesions HEAD: Normocephalic, No masses, lesions, tenderness or abnormalities EYES: normal, EOMI, Conjunctiva are pink and non-injected EARS: External ears normal OROPHARYNX:lips, buccal mucosa, and tongue normal and mucous membranes are moist  NECK: supple, trachea midline LYMPH:  no palpable lymphadenopathy BREAST:not examined LUNGS: clear to auscultation  HEART: regular rate & rhythm ABDOMEN:abdomen soft, obese and normal bowel sounds BACK: Back symmetric, no curvature. EXTREMITIES:less then 2 second capillary refill, no joint deformities, effusion, or inflammation, no skin discoloration, no cyanosis  NEURO: alert & oriented x 3 with fluent speech, no focal motor/sensory deficits, in wheelchair.   LABORATORY DATA: CBC    Component Value Date/Time   WBC 7.0 02/27/2016 1500   RBC 4.18 02/27/2016 1500   HGB 13.3 02/27/2016 1500   HCT 39.1 02/27/2016 1500   PLT 158 02/27/2016 1500   MCV 93.5 02/27/2016 1500   MCH 31.8 02/27/2016 1500   MCHC 34.0 02/27/2016 1500   RDW 12.7 02/27/2016 1500   LYMPHSABS 2.5 02/27/2016 1500   MONOABS 0.5  02/27/2016 1500   EOSABS 0.1 02/27/2016 1500   BASOSABS 0.0 02/27/2016 1500      Chemistry      Component Value Date/Time   NA 137 02/27/2016 1500   K 4.1 02/27/2016 1500   CL 105 02/27/2016 1500   CO2 24 02/27/2016 1500   BUN 17 02/27/2016 1500   CREATININE 1.07 (H) 02/27/2016 1500      Component Value Date/Time   CALCIUM 9.0 02/27/2016 1500   ALKPHOS 60 02/27/2016 1500   AST 26 02/27/2016 1500   ALT 21 02/27/2016 1500   BILITOT 0.4 02/27/2016 1500     Lab Results  Component Value Date   CA125 19.7 02/27/2016      PENDING LABS:   RADIOGRAPHIC STUDIES:  No results found.   PATHOLOGY:    ASSESSMENT AND PLAN:  Malignant neoplasm of ovary (H[facility]) Ovarian Cancer/Endometrial cancer, S/P TAH-BSO by Dr. [name] with unsure pathology regarding one primary with metastatic disease versus two separate primaries followed by Carboplatin/Taxol x 5/6 cycles (last cycle cancelled due to intolerance and thrombocytopenia),  followed by EBRT and intravaginal brachytherapy.  She did have a recurrence in October 2015 on PET imaging requiring another treatment with EBRT.  Finishing all therapy on 07/15/2014.  Pre-operative CA 125 was elevated.  Oncology history developed.  Patient originally seen by Dr. Irene Limbo at time of medical oncology transfer of care in July 2017.  At this time, his note is not yet complete.  Labs performed on 02/27/2016: CBC diff, CMET, CA 125.  I personally reviewed and went over laboratory results with the patient.  The results are noted within this dictation.  I have recommended a referral to [facility] [facility], but she declines.    She is NOT up to date on her colonoscopy by the patient's own admission.  She will be referred to survivorship program as well and we will share in the patient's surveillance.  Labs in 3 months: CBC diff, CMET, CA 125.  Imaging will be ordered if/when clinically indicated.  We discussed her pain medication needs.  She uses her  Percocet for neuropathic pain.  She has been maintained on #180 Percocet by Dr. Tressie Stalker.  [place] Controlled Substance Reporting System is reviewed regarding her controlled substance prescribing.  She will be due next week for a refill on her pain medication.  I have reviewed long-acting pain medication.  She is provided education regarding the role of a long-acting medication.  Since she has been on Oxycodone based therapy for some time, we will try to get Oxycontin approved for her.  Rx is printed for Oxycontin 20 mg BID.  The goal of therapy is to decrease her short-acting pain medication needs.  Return in 3 months for follow-up.    ORDERS PLACED FOR THIS ENCOUNTER: Orders Placed This Encounter  Procedures  . CBC with Differential  . Comprehensive metabolic panel  . CA 125    MEDICATIONS PRESCRIBED THIS ENCOUNTER: Meds ordered this encounter  Medications  . glipiZIDE (GLUCOTROL) 10 MG tablet    Sig: TAKE 1 TABLET BY MOUTH TWICE A DAY FOR DIABETES    Refill:  3  . VICTOZA 18 MG/3ML SOPN  . oxyCODONE-acetaminophen (PERCOCET) 10-325 MG tablet    Sig: Take 1 tablet by mouth every 4 (four) hours as needed for pain.    Dispense:  180 tablet    Refill:  0    To be filled on 03/11/2016    Order Specific Question:   Supervising Provider    Answer:   Patrici Ranks [id number]  . oxyCODONE (OXYCONTIN) 20 mg 12 hr tablet    Sig: Take 1 tablet (20 mg total) by mouth every 12 (twelve) hours.    Dispense:  60 tablet    Refill:  0    Order Specific Question:   Supervising Provider    Answer:   Patrici Ranks [id number]    THERAPY PLAN:  NCCN guidelines for surveillance of Stage I-IV Epithelial Ovarian Cancer/ Fallopian Tube Cancer/ Primary Peritoneal Cancer in those experiencing a complete response recommends the following (1.2017):  A. Vists every 2-4 months for first 2 years, then 3-6 months for 3 years, and then annually after 5 years.  B. Physical exam including pelvic  exam  C. CA-125 or other tumor markers if initially elevated.  D. Refer for genetic risk evaluation if not previously performed.   E. CBC and chemistry profile as indicated.   F. CT Chest/Abdomen/Pelvis, MRI, PET-CT, or PET (skull base to mid-thigh) as clinically indicated.  G. Chest X-ray as indicted.   H.  Long-term wellness care.    All questions were answered. The patient knows to call the clinic with any problems, questions or concerns. We can certainly see the patient much sooner if necessary.  Patient and plan discussed with Dr. Ancil Linsey and she is in agreement with the aforementioned.   This note is electronically signed by: Doy Mince 03/02/2016 9:47 PM

## 2016-03-07 NOTE — Progress Notes (Signed)
Marland Kitchen    HEMATOLOGY/ONCOLOGY CONSULTATION NOTE  Date of Service: .01/15/2016  Patient Care Team: [name], MD as PCP - General (Family Medicine)  CHIEF COMPLAINTS/PURPOSE OF CONSULTATION:  Continue in management of ovarian cancer  HISTORY OF PRESENTING ILLNESS:   Kathleen Whitehead is a wonderful 65 y.o. female who has been referred to [place] by Dr .[name], MD Everardo All for evaluation and management of Ovarian Cancer/Uterine cancer.  Patient had initially presented to [facility] on 04/30/2013 with heavy vaginal bleeding and a large pelvic mass. No bedside examination was attempted as well as tissue biopsies on 04/10/2013. Preliminary pathology was suggestive of endometrioid adenocarcinoma. Patient subsequently had an examination under general anesthesia with biopsies and D&C on 04/27/2013 confirming the presence of grade 2 endometrial adenocarcinoma with focal clear cell features.  05/11/2013 - patient had explorative laparotomy with excision of the large pelvic mass and total abdominal hysterectomy with bilateral salpingo-oophorectomy and cancer staging biopsies.  Her tumor was sent for biotheranostics and final pathology returned Stage II Grade 2 endometrial cancer, and Stage IIC endometrioid ovarian cancer.  She was recommended and received adjuvant treatment for ovarian cancer with carboplatin plus Taxol followed and received 5 cycles of chemotherapy out of a total planned 6 cycles due to issues with intolerance, significant thrombocytopenia and neuropathy.  She was recommended and received external beam radiation therapy followed by intravaginal brachii therapy for her synchronous diagnosis of endometrial cancer.  Follow-up studies with PET/CT scan in October 2015 showed significant uptake in the aortocaval lymph nodes but no other overt disease. This was thought to represent persistence/ progression of disease either from her ovarian cancer or endometrial  cancer. CA-125 levels are within normal limits at the time.  06/03/2014 - 07/15/2014- Patient received radiation to the periaortic lymph nodes.  There was some concern for progression of new mesenteric and subdiaphragmatic disease on CT scan on 07/19/2014  However PET/CT scan done on 08/05/2014 did not show any metastatic disease and that the lesions were stable.  Patient was subsequently being monitored every 3 months with labs.  She was last seen by Dr. Abran Duke on 09/02/2015.  She was noted to have significant neuropathy thought to be worsening due to uncontrolled diabetes. She has tried gabapentin which gives her hallucinations and nightmares and makes her too sleepy. She was on Cymbalta which made her "bones hurt". Has currently been on amitriptyline and when necessary Percocet.  Notes that she is having issues with some yeast infection for which she uses corn starch. On social hot flashes.  She has been getting her port flushed every 6 weeks. Currently denies any vaginal bleeding. No new abdominal pain. No change in bowel or bladder function. No new weight loss. Has chronic left hip pain.  Her CA-125 levels have been stable in the last one on 11/27/2015 was 16.1.  ONCOLOGIC HISTORY  Ovarian cancer/Endometrial cancer  04/30/2013 Initial Diagnosis  Ovarian cancer/uterine cancer status post TAH and BSO by Dr. [name] and associates, pathology could not be certain that these were not 2 separate primaries rather than one metastatic process from the uterus to the ovary  Stage II Grade 2 endometrial cancer, and Stage IIC endometrioid ovarian cancer.  08/15/2013 - 12/06/2013 Chemotherapy  Cycle 3 of carboplatin/Taxol given today with 15% dose reduction in Taxol due to at least grade 2 peripheral neuropathy and gabapentin induced nightmares/hallucinations. 5 cycles of chemotherapy given of a planned 6, latter due to complications of significant thrombocytopenia/intolerance  09/10/2013 -  10/17/2013 Radiation  5040 cGy delivered by external beam radiation therapy followed by intravaginal brachytherapy  04/30/2014 Progression  Positive PET scan c/w recurrent disease of either ovarian ca or endometrial ca  06/03/2014 - 07/15/2014 Radiation  5040 cGy over 28 fractions delivered to the low periaortic area lymph node either metastatic endometrial or metastatic ovarian (Dr Sondra Come)  07/19/2014 Progression  new mesenteric disease and sub-diaphragmatic disease  08/05/2014 Remission  PET scan does not reveal progression of disease! There is no obvious disease seen whatsoever        MEDICAL HISTORY:  Past Medical History:  Diagnosis Date  . Anemia   . Chemotherapy induced neutropenia ([place])   . Chemotherapy induced thrombocytopenia   . Diabetes mellitus without complication ([place])   . Endometrial cancer ([place])   . Hot flashes   . Hypertension   . Malignant neoplasm ([place])   . Malignant neoplasm of ovary ([place]) 04/30/2013  . Obesity   . Ovarian cancer ([place])   . Pancytopenia ([place])   . Peripheral artery disease ([place])   . Peripheral neuropathy ([place])   . Port-a-cath in place   Significant diabetes and chemotherapy related neuropathy  SURGICAL HISTORY: History reviewed. No pertinent surgical history.  SOCIAL HISTORY: Social History   Social History  . Marital status: Married    Spouse name: N/A  . Number of children: N/A  . Years of education: N/A   Occupational History  . Not on file.   Social History Main Topics  . Smoking status: Never Smoker  . Smokeless tobacco: Never Used  . Alcohol use No  . Drug use: No  . Sexual activity: Not on file   Other Topics Concern  . Not on file   Social History Narrative  . No narrative on file    FAMILY HISTORY: Family History  Problem Relation Age of Onset  . Heart failure Mother   . Stroke Sister   . Cancer Sister   . Diabetes Sister   . Leukemia Sister   . Diabetes Brother     ALLERGIES:  is allergic to  diphenhydramine; iodinated diagnostic agents; duloxetine hcl; ibuprofen; and penicillins.  MEDICATIONS:  Current Outpatient Prescriptions  Medication Sig Dispense Refill  . acetaminophen (TYLENOL) 500 MG tablet Take by mouth.    Marland Kitchen aspirin EC 325 MG tablet     . cetirizine (ZYRTEC) 10 MG tablet Take by mouth.    Marland Kitchen lisinopril (PRINIVIL,ZESTRIL) 40 MG tablet Take by mouth.    . loperamide (IMODIUM) 2 MG capsule Take by mouth.    . metFORMIN (GLUCOPHAGE) 1000 MG tablet Take by mouth.    . Omega-3 Fatty Acids (FISH OIL) 1000 MG CAPS Take by mouth.    . ondansetron (ZOFRAN-ODT) 4 MG disintegrating tablet Take by mouth.    . polyethylene glycol powder (MIRALAX) powder Take by mouth.    Marland Kitchen glipiZIDE (GLUCOTROL) 10 MG tablet TAKE 1 TABLET BY MOUTH TWICE A DAY FOR DIABETES  3  . oxyCODONE (OXYCONTIN) 20 mg 12 hr tablet Take 1 tablet (20 mg total) by mouth every 12 (twelve) hours. 60 tablet 0  . oxyCODONE-acetaminophen (PERCOCET) 10-325 MG tablet Take 1 tablet by mouth every 4 (four) hours as needed for pain. 180 tablet 0  . VICTOZA 18 MG/3ML SOPN      Current Facility-Administered Medications  Medication Dose Route Frequency Provider Last Rate Last Dose  . sodium chloride flush (NS) 0.9 % injection 10 mL  10 mL Intravenous PRN Brunetta Genera, MD  10 mL at 01/15/16 1652    REVIEW OF SYSTEMS:    10 Point review of Systems was done is negative except as noted above.  PHYSICAL EXAMINATION: ECOG PERFORMANCE STATUS: 2 - Symptomatic, <50% confined to bed  . Vitals:   01/15/16 1612  BP: (!) 155/55  Pulse: 96  Resp: 18  Temp: 98.2 F (36.8 C)   Filed Weights   01/15/16 1612  Weight: 251 lb 9.6 oz (114.1 kg)   .There is no height or weight on file to calculate BMI.  GENERAL:alert, in no acute distress and comfortable SKIN: skin color, texture, turgor are normal, no rashes or significant lesions EYES: normal, conjunctiva are pink and non-injected, sclera clear OROPHARYNX:no exudate,  no erythema and lips, buccal mucosa, and tongue normal  NECK: supple, no JVD, thyroid normal size, non-tender, without nodularity LYMPH:  no palpable lymphadenopathy in the cervical, axillary or inguinal LUNGS: clear to auscultation with normal respiratory effort HEART: regular rate & rhythm,  no murmurs and no lower extremity edema ABDOMEN: abdomen soft, non-tender, normoactive bowel sounds , No palpable hepatosplenomegaly Musculoskeletal: no cyanosis of digits and no clubbing  PSYCH: alert & oriented x 3 with fluent speech NEURO: no focal motor deficits  LABORATORY DATA:  I have reviewed the data as listed    Laboratory Data: 05/2013  Pathology: DIAGNOSIS   1. SOFT TISSUE, PERITONEUM, EXCISION:  ACUTE INFLAMMATION AND FIBRIN.  NEGATIVE FOR TUMOR.  (RB1[MNS])   2. OVARY, RIGHT, RESECTION:  ADENOCARCINOMA, ENDOMETRIOID TYPE, EXTENSIVELY NECROTIC.  MARKED HEMORRHAGIC ADHESIONS.   FALLOPIAN TUBE, RIGHT, RESECTION:  DILATED FALLOPIAN TUBE WITH MARKED FIBRINOPURULENT INFLAMMATION  (PYOSALPINX).  NEGATIVE FOR INVASIVE TUMOR.  (ESN[MNS])   3. UTERUS:  ADENOCARCINOMA, ENDOMETRIOID TYPE, FIGO GRADE II, WITH ULCERATION AND  EXTENSIVE NECROSIS.  MARKED SEROSAL ADHESIONS.   OVARY, LEFT, RESECTION:  BENIGN-APPEARING OVARY WITH MARKED HEMORRHAGIC ADHESIONS.   FALLOPIAN TUBE, LEFT, RESECTION:  DILATED FALLOPIAN TUBE WITH PRONOUNCED FIBRINOPURULENT INFLAMMATION  (PYOSALPINX).  MARKED HEMORRHAGIC SEROSAL ADHESIONS WITH MINUTE FOCUS SUSPICIOUS FOR  TUMOR.  (ESN[MNS])   4. CERVIX AND LOWER UTERINE SEGMENT, RESECTION:  ADENOCARCINOMA, ENDOMETRIOID TYPE, INVOLVING CERVIX.  MARKED SEROSAL ADHESIONS AND MEDIAL CALCIFIC SCLEROSIS.  (ESN[MNS])   5. "IMPLANT," SMALL BOWEL, BIOPSY:  FIBROCONNECTIVE TISSUE WITH ABUNDANT FIBRINOPURULENT MATERIAL AND  ADHESIONS.  NEGATIVE FOR TUMOR.  (ESN[MNS])   TEMPLATE FOR OVARIAN CARCINOMA (INCLUDES PRIMARY PERITONEAL)   PROCEDURE/SPECIMEN TYPE:  RIGHT SALPINGO-OOPHORECTOMY. SPECIMEN INTEGRITY: DISRUPTED. LOCATION: RIGHT OVARY. HISTOPATHOLOGIC TYPE: ADENOCARCINOMA, ENDOMETRIOID  TYPE. FOCALITY: UNIFOCAL. SIZE: 13.5 CM. GRADE: WELL DIFFERENTIATED. CAPSULE INTACT: DISRUPTED. SURFACE INVOLVEMENT: PRESENT. RESECTION MARGINS: N/A. EXTENSION TO OTHER PELVIC STRUCTURES: NOT DEFINITIVELY IDENTIFIED. ANGIOLYMPHATIC INVASION: NOT IDENTIFIED. OMENTAL METASTASES: N/A. PERITONEAL METASTASES: NOT IDENTIFIED. TYPE OF IMPLANT (LMP TUMORS ONLY): N/A. TOTAL NODES EXAMINED: 0. NODES CONTAINING METASTASES: N/A. SPECIAL STUDIES: WT-1, E-CADHERIN, AND CYCLIN  D1 PERFORMED. TNM STAGE: T2A (AT LEAST) NX M0 AJCC STAGE GROUPING: IIA (AT LEAST) FIGO STAGE: IIA (AT LEAST)  TEMPLATE FOR ENDOMETRIAL CARCINOMA   PROCEDURE/SPECIMEN TYPE: HYSTERECTOMY, BILATERAL  SALPINGO-OOPHORECTOMY AND DEBULKING. SPECIMEN INTEGRITY: INTACT. LOCATION: UTERUS. HISTOPATHOLOGIC TYPE: ADENOCARCINOMA, ENDOMETRIOID TYPE.  SIZE: 4.5 CM (AT LEAST). GRADE: FIGO GRADE II. DEPTH OF INVASION: TUMOR INVADES 1.2 CM. WHERE MYOMETRIAL THICKNESS 1.8 CM (SLIDE 3A). IS: SEROSAL INVOLVEMENT: NOT DEFINITIVELY IDENTIFIED. ENDOCERVICAL INVOLVEMENT: PRESENT. RESECTION MARGINS: NEGATIVE. EXTRAUTERINE EXTENSION: NOT DEFINITIVELY IDENTIFIED. ANGIOLYMPHATIC INVASION: NOT IDENTIFIED. TOTAL NODES EXAMINED: 0. PELVIC NODES EXAMINED: N/A. PELVIC NODES INVOLVED: N/A. PARA-AORTIC NODES N/A. EXAMINED: PARA-AORTIC NODES N/A. INVOLVED: SPECIAL STUDIES: MLH-1,MSH-2, MSH-6, AND PMS-2  IMMUNOPEROXIDASE STAINS ARE PENDING AND  RESULTS WILL BE ISSUED IN ADDENDUM. CK  AE1/AE3, CD68, AND CALRETININ STAINS ARE  PERFORMED ON SELECTED SLIDES IN PART 3.  TNM STAGE: T2 NX MO AJCC STAGE GROUPING: II FIGO STAGE: II  COMMENT  THE TUMOR IN THE UTERUS IS COMPOSED PREDOMINANTLY OF WELL-DIFFERENTIATED  MALIGNANT GLANDS, CORRESPONDING TO A FIGO GRADE II (ARCHITECTURE=1,  NUCLEI=2). THE TUMOR IS  INVASIVE INTO THE OUTER HALF OF THE MYOMETRIUM  AND EXTENDS INTO THE STROMAL CONNECTIVE TISSUE OF THE ENDO/ECTOCERVIX.  THERE IS EXTENSIVE ULCERATION AND NECROSIS WITHIN THE ENDOMETRIUM. THE  TUMOR IN THE RIGHT OVARY ALSO APPEARS ENDOMETRIOID BUT SHOWS AREAS OF  MUCINOUS AND SQUAMOUS DIFFERENTIATION WITH A BACKGROUND STROMA THAT  SUGGESTS PREVIOUS "ADENOFIBROMA" TYPE OF CHANGES. THE TUMOR IN THE  OVARY IS EXTENSIVELY NECROTIC WITH EVIDENCE OF RUPTURE AND PROBABLE  TUMOR ON THE SURFACE (SLIDE 2B). BOTH TUMORS ARE WT-1 NEGATIVE AND  E-CADHERIN POSITIVE. THE OVARIAN TUMOR IS FOCALLY POSITIVE FOR CYCLIN  D1 WHILE THE UTERINE TUMOR IS NEGATIVE. THE ENDOMETRIAL TUMOR SHOWS  STRONGLY CYTOPLASMIC VIMENTIN POSITIVITY AS WELL. IN MY OPINION, IT IS  DIFFICULT TO DETERMINE WITH ABSOLUTE CERTAINTY THE PRIMARY ORIGINS OF  THESE TUMORS AND THERE ARE INADEQUATE STUDIES TO DEFINITIVELY DISCERN  BETWEEN THE TWO. WHILE I CANNOT COMPLETELY EXCLUDE AN ENDOMETRIAL  PRIMARY WITH SPREAD TO THE RIGHT OVARY, GIVEN THE HISTOLOGIC DIFFERENCES  AND UNILATERAL OVARIAN INVOLVEMENT, I FAVOR THIS TO REPRESENT TWO  SEPARATE PRIMARY TUMORS. DR. Salomon Fick HAS REVIEWED SELECTED SLIDES AND  CONCURS WITH THIS INTERPRETATION.   ADDITIONALLY, THERE IS ABUNDANT FIBRINOPURULENT DEBRIS WITH PRONOUNCED  HEMORRHAGIC ADHESIONS COVERING THE SEROSAL SURFACES OF THE OVARIES,  FALLOPIAN TUBES, AND THE UTERINE SEROSA. CYTOKERATIN AE1/AE3,  CALRETININ, AND CD68 IMMUNOSTAINS ARE PERFORMED ON SELECTED SLIDES TO  EVALUATE FOR THE PRESENCE OF TUMOR. THERE IS A SMALL FOCUS HIGHLY  SUSPICIOUS FOR TUMOR PRESENT IN THE LEFT PERI-ADNEXAL SOFT TISSUE  ADHESIONS (SLIDE 3L), HENCE THE T2 DESIGNATION IN THE OVARIAN CARCINOMA  TEMPLATE.    RADIOGRAPHIC STUDIES: I have personally reviewed the radiological images as listed and agreed with the findings in the report. No results found.  ASSESSMENT & PLAN:   65 year old female with multiple medical  comorbidities with   1)Stage II Grade 2 endometrial cancer 2)Stage IIC endometrioid ovarian cancer. 3) periaortic lymph node metastases from either endometrial or ovarian cancer status post radiation. Patient has had extensive treatment as summarized above in history of present illness and oncologic history. She is here to establish continued evaluation and oncology management of her tumors. No overt clinical symptoms suggestive of disease progression at this time. Her last CA-125 level was within normal limits in May 2017. 4) chemotherapy and diabetes mellitus related neuropathy  Plan -Continue port flushes every 6 weeks including today -We will need to repeat labs including CBC, CMP, CA-125 in 6 weeks. -PET/CT scan in 6 weeks -Due for her mammogram in 6 months. -Return to care with Dr. Whitney Muse in 6-8 weeks for continued management with labs and PET/CT scan.  4).Other medical co-morbids Patient Active Problem List   Diagnosis Date Noted  . 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 Axillary lump 12/08/2015  . Blush 03/11/2015  . Acquired pancytopenia ([place]) 12/09/2014  . Presence of other vascular implants and grafts 04/18/2014  . Drug-induced low platelet count 12/04/2013  . Peripheral nerve disease ([place]) 12/04/2013  . Chemotherapy-induced neutropenia ([place]) 08/24/2013  . Malignant neoplasm of endometrium ([place]) 06/11/2013  . Adult BMI 30+ 05/07/2013  . Abdominal  pain 04/30/2013  . Acute blood loss anemia 04/30/2013  . Type 2 diabetes mellitus ([place]) 04/30/2013  . BP (high blood pressure) 04/30/2013  . Malignant neoplasm of ovary ([place]) 04/30/2013  Plan -Continue management with primary care physician .[name], MD   Return to clinic with Dr. Whitney Muse in 6-8 weeks with labs and PET/CT scan.  All of the patients questions were answered with apparent satisfaction. The patient knows to call the clinic with any problems, questions or concerns.  I spent 70 minutes  counseling the patient face to face. The total time spent in the appointment was 80 minutes and more than 50% was on counseling and direct patient cares.    Sullivan Lone MD [place] AAHIVMS [facility] Hematology/Oncology Physician [facility]  (Office):       [phone] (Work cell):  [phone] (Fax):           [phone]

## 2016-03-24 ENCOUNTER — Encounter: Payer: Self-pay | Admitting: Oncology

## 2016-04-09 ENCOUNTER — Encounter (HOSPITAL_COMMUNITY): Payer: Medicare HMO | Attending: Hematology

## 2016-04-09 ENCOUNTER — Telehealth (HOSPITAL_COMMUNITY): Payer: Self-pay | Admitting: *Deleted

## 2016-04-09 ENCOUNTER — Encounter (HOSPITAL_COMMUNITY): Payer: Self-pay

## 2016-04-09 VITALS — BP 148/66 | HR 78 | Resp 18

## 2016-04-09 DIAGNOSIS — Z452 Encounter for adjustment and management of vascular access device: Secondary | ICD-10-CM

## 2016-04-09 DIAGNOSIS — C569 Malignant neoplasm of unspecified ovary: Secondary | ICD-10-CM | POA: Diagnosis not present

## 2016-04-09 DIAGNOSIS — Z95828 Presence of other vascular implants and grafts: Secondary | ICD-10-CM | POA: Insufficient documentation

## 2016-04-09 DIAGNOSIS — C541 Malignant neoplasm of endometrium: Secondary | ICD-10-CM | POA: Insufficient documentation

## 2016-04-09 MED ORDER — HEPARIN SOD (PORK) LOCK FLUSH 100 UNIT/ML IV SOLN
500.0000 [IU] | Freq: Once | INTRAVENOUS | Status: AC
  Administered 2016-04-09: 500 [IU] via INTRAVENOUS

## 2016-04-09 MED ORDER — OXYCODONE-ACETAMINOPHEN 10-325 MG PO TABS: 1.0000 | ORAL_TABLET | ORAL | 0 refills | Status: DC | PRN

## 2016-04-09 MED ORDER — SODIUM CHLORIDE 0.9% FLUSH
10.0000 mL | INTRAVENOUS | Status: DC | PRN
  Administered 2016-04-09: 10 mL via INTRAVENOUS
  Filled 2016-04-09: qty 10

## 2016-04-09 NOTE — Patient Instructions (Signed)
[facility] at [facility] Discharge Instructions  RECOMMENDATIONS MADE BY THE CONSULTANT AND ANY TEST RESULTS WILL BE SENT TO YOUR REFERRING PHYSICIAN.  Port flush done today. Follow up as scheduled.  Thank you for choosing [facility] at [facility] to provide your oncology and hematology care.  To afford each patient quality time with our provider, please arrive at least 15 minutes before your scheduled appointment time.   Beginning January 23rd 2017 lab work for the Cancer Center will be done in the  Main lab at [facility] on 1st floor. If you have a lab appointment with the Cancer Center please come in thru the  Main Entrance and check in at the main information desk  You need to re-schedule your appointment should you arrive 10 or more minutes late.  We strive to give you quality time with our providers, and arriving late affects you and other patients whose appointments are after yours.  Also, if you no show three or more times for appointments you may be dismissed from the clinic at the providers discretion.     Again, thank you for choosing [facility].  Our hope is that these requests will decrease the amount of time that you wait before being seen by our physicians.       _____________________________________________________________  Should you have questions after your visit to [facility], please contact our office at [phone] between the hours of 8:30 a.m. and 4:30 p.m.  Voicemails left after 4:30 p.m. will not be returned until the following business day.  For prescription refill requests, have your pharmacy contact our office.         Resources For Cancer Patients and their Caregivers ? American Cancer Society: Can assist with transportation, wigs, general needs, runs Look Good Feel Better.        1-888-227-6333 ? Cancer Care: Provides financial assistance, online support groups,  medication/co-pay assistance.  1-800-813-HOPE (4673) ? [facility] Assists [place] cancer patients and their families through emotional , educational and financial support.  336-427-4357 ? [place] DSS Where to apply for food stamps, Medicaid and utility assistance. 336-342-1394 ? RCATS: Transportation to medical appointments. 336-347-2287 ? Social Security Administration: May apply for disability if have a Stage IV cancer. 336-342-7796 1-800-772-1213 ? [place] Aging, Disability and Transit Services: Assists with nutrition, care and transit needs. 336-349-2343  Cancer Center Support Programs: @10RELATIVEDAYS@ > Cancer Support Group  2nd Tuesday of the month 1pm-2pm, Journey Room  > Creative Journey  3rd Tuesday of the month 1130am-1pm, Journey Room  > Look Good Feel Better  1st Wednesday of the month 10am-12 noon, Journey Room (Call American Cancer Society to register 1-800-395-5775)   

## 2016-04-09 NOTE — Progress Notes (Signed)
Kathleen Whitehead presented for Portacath access and flush. Portacath located right  chest wall accessed with  H 20 needle. Good blood return present. Portacath flushed with 82ml NS and 500U/58ml Heparin and needle removed intact. Procedure without incident. Patient tolerated procedure well.  Vitals stable, discharged home from clinic via wheelchair with husband.

## 2016-04-15 DIAGNOSIS — E1165 Type 2 diabetes mellitus with hyperglycemia: Secondary | ICD-10-CM | POA: Diagnosis not present

## 2016-04-15 DIAGNOSIS — E559 Vitamin D deficiency, unspecified: Secondary | ICD-10-CM | POA: Diagnosis not present

## 2016-04-15 DIAGNOSIS — N182 Chronic kidney disease, stage 2 (mild): Secondary | ICD-10-CM | POA: Diagnosis not present

## 2016-04-15 DIAGNOSIS — I10 Essential (primary) hypertension: Secondary | ICD-10-CM | POA: Diagnosis not present

## 2016-04-15 DIAGNOSIS — E782 Mixed hyperlipidemia: Secondary | ICD-10-CM | POA: Diagnosis not present

## 2016-04-15 DIAGNOSIS — R5382 Chronic fatigue, unspecified: Secondary | ICD-10-CM | POA: Diagnosis not present

## 2016-04-21 DIAGNOSIS — E1143 Type 2 diabetes mellitus with diabetic autonomic (poly)neuropathy: Secondary | ICD-10-CM | POA: Diagnosis not present

## 2016-04-21 DIAGNOSIS — Z23 Encounter for immunization: Secondary | ICD-10-CM | POA: Diagnosis not present

## 2016-04-21 DIAGNOSIS — E1165 Type 2 diabetes mellitus with hyperglycemia: Secondary | ICD-10-CM | POA: Diagnosis not present

## 2016-04-21 DIAGNOSIS — Z6837 Body mass index (BMI) 37.0-37.9, adult: Secondary | ICD-10-CM | POA: Diagnosis not present

## 2016-04-21 DIAGNOSIS — D696 Thrombocytopenia, unspecified: Secondary | ICD-10-CM | POA: Diagnosis not present

## 2016-04-21 DIAGNOSIS — E1122 Type 2 diabetes mellitus with diabetic chronic kidney disease: Secondary | ICD-10-CM | POA: Diagnosis not present

## 2016-04-21 DIAGNOSIS — E782 Mixed hyperlipidemia: Secondary | ICD-10-CM | POA: Diagnosis not present

## 2016-04-23 ENCOUNTER — Encounter (HOSPITAL_COMMUNITY): Payer: Medicare HMO

## 2016-05-07 ENCOUNTER — Other Ambulatory Visit (HOSPITAL_COMMUNITY): Payer: Self-pay | Admitting: Oncology

## 2016-05-07 DIAGNOSIS — Z95828 Presence of other vascular implants and grafts: Secondary | ICD-10-CM

## 2016-05-07 DIAGNOSIS — C569 Malignant neoplasm of unspecified ovary: Secondary | ICD-10-CM

## 2016-05-07 MED ORDER — OXYCODONE-ACETAMINOPHEN 10-325 MG PO TABS: 1.0000 | ORAL_TABLET | ORAL | 0 refills | Status: DC | PRN

## 2016-05-21 ENCOUNTER — Encounter (HOSPITAL_COMMUNITY): Payer: Medicare HMO | Attending: Hematology

## 2016-05-21 DIAGNOSIS — C541 Malignant neoplasm of endometrium: Secondary | ICD-10-CM | POA: Diagnosis not present

## 2016-05-21 DIAGNOSIS — C569 Malignant neoplasm of unspecified ovary: Secondary | ICD-10-CM | POA: Diagnosis not present

## 2016-05-21 DIAGNOSIS — Z95828 Presence of other vascular implants and grafts: Secondary | ICD-10-CM | POA: Insufficient documentation

## 2016-05-21 LAB — CBC WITH DIFFERENTIAL/PLATELET
BASOS ABS: 0 10*3/uL (ref 0.0–0.1)
BASOS PCT: 0 %
Eosinophils Absolute: 0.1 10*3/uL (ref 0.0–0.7)
Eosinophils Relative: 1 %
HEMATOCRIT: 38.3 % (ref 36.0–46.0)
HEMOGLOBIN: 12.9 g/dL (ref 12.0–15.0)
LYMPHS PCT: 29 %
Lymphs Abs: 2.1 10*3/uL (ref 0.7–4.0)
MCH: 31.5 pg (ref 26.0–34.0)
MCHC: 33.7 g/dL (ref 30.0–36.0)
MCV: 93.4 fL (ref 78.0–100.0)
Monocytes Absolute: 0.3 10*3/uL (ref 0.1–1.0)
Monocytes Relative: 5 %
NEUTROS ABS: 4.7 10*3/uL (ref 1.7–7.7)
NEUTROS PCT: 65 %
Platelets: 160 10*3/uL (ref 150–400)
RBC: 4.1 MIL/uL (ref 3.87–5.11)
RDW: 12.8 % (ref 11.5–15.5)
WBC: 7.3 10*3/uL (ref 4.0–10.5)

## 2016-05-21 LAB — COMPREHENSIVE METABOLIC PANEL
ALBUMIN: 3.1 g/dL — AB (ref 3.5–5.0)
ALK PHOS: 57 U/L (ref 38–126)
ALT: 20 U/L (ref 14–54)
AST: 32 U/L (ref 15–41)
Anion gap: 8 (ref 5–15)
BILIRUBIN TOTAL: 0.3 mg/dL (ref 0.3–1.2)
BUN: 20 mg/dL (ref 6–20)
CO2: 27 mmol/L (ref 22–32)
CREATININE: 1.19 mg/dL — AB (ref 0.44–1.00)
Calcium: 10 mg/dL (ref 8.9–10.3)
Chloride: 102 mmol/L (ref 101–111)
GFR calc Af Amer: 54 mL/min — ABNORMAL LOW (ref >60)
GFR calc non Af Amer: 47 mL/min — ABNORMAL LOW (ref >60)
GLUCOSE: 191 mg/dL — AB (ref 65–99)
POTASSIUM: 4.7 mmol/L (ref 3.5–5.1)
Sodium: 137 mmol/L (ref 135–145)
TOTAL PROTEIN: 6.5 g/dL (ref 6.5–8.1)

## 2016-05-21 MED ORDER — SODIUM CHLORIDE 0.9% FLUSH
10.0000 mL | Freq: Once | INTRAVENOUS | Status: AC
  Administered 2016-05-21: 10 mL via INTRAVENOUS

## 2016-05-21 MED ORDER — HEPARIN SOD (PORK) LOCK FLUSH 100 UNIT/ML IV SOLN
500.0000 [IU] | Freq: Once | INTRAVENOUS | Status: AC
  Administered 2016-05-21: 500 [IU] via INTRAVENOUS
  Filled 2016-05-21: qty 5

## 2016-05-21 NOTE — Progress Notes (Signed)
Claudette Head presented for Portacath access and flush. Proper placement of portacath confirmed by CXR. Portacath located right chest wall accessed with  H 20 needle. Good blood return present. Portacath flushed with 63ml NS and 500U/52ml Heparin and needle removed intact. Procedure without incident. Patient tolerated procedure well.

## 2016-05-21 NOTE — Patient Instructions (Signed)
[place] at [facility] Discharge Instructions  RECOMMENDATIONS MADE BY THE CONSULTANT AND ANY TEST RESULTS WILL BE SENT TO YOUR REFERRING PHYSICIAN.  Port flush with lab work today as ordered.  Thank you for choosing [place] at [facility] to provide your oncology and hematology care.  To afford each patient quality time with our provider, please arrive at least 15 minutes before your scheduled appointment time.   Beginning January 23rd 2017 lab work for the [facility] will be done in the  Main lab at [facility] on 1st floor. If you have a lab appointment with the [place] please come in thru the  Main Entrance and check in at the main information desk  You need to re-schedule your appointment should you arrive 10 or more minutes late.  We strive to give you quality time with our providers, and arriving late affects you and other patients whose appointments are after yours.  Also, if you no show three or more times for appointments you may be dismissed from the clinic at the providers discretion.     Again, thank you for choosing [facility].  Our hope is that these requests will decrease the amount of time that you wait before being seen by our physicians.       _____________________________________________________________  Should you have questions after your visit to [facility], please contact our office at [phone] between the hours of 8:30 a.m. and 4:30 p.m.  Voicemails left after 4:30 p.m. will not be returned until the following business day.  For prescription refill requests, have your pharmacy contact our office.         Resources For Cancer Patients and their Caregivers ? American Cancer Society: Can assist with transportation, wigs, general needs, runs Look Good Feel Better.        (616) 376-9664 ? Cancer Care: Provides financial assistance, online support groups, medication/co-pay  assistance.  1-800-813-HOPE 5513020513) ? [facility] Assists [place] cancer patients and their families through emotional , educational and financial support.  3340236142 ? [place] DSS Where to apply for food stamps, Medicaid and utility assistance. 609-612-3400 ? RCATS: Transportation to medical appointments. (763) 214-1135 ? Social Security Administration: May apply for disability if have a Stage IV cancer. (820) 236-6534 364-579-5817 ? [employer], Disability and Transit Services: Assists with nutrition, care and transit needs. [place] Support Programs: @10RELATIVEDAYS @ > Cancer Support Group  2nd Tuesday of the month 1pm-2pm, Journey Room  > Creative Journey  3rd Tuesday of the month 1130am-1pm, Journey Room  > Look Good Feel Better  1st Wednesday of the month 10am-12 noon, Journey Room (Call [place] to register [phone])

## 2016-05-22 LAB — CA 125: CA 125: 43.7 U/mL — AB (ref 0.0–38.1)

## 2016-06-02 ENCOUNTER — Encounter (HOSPITAL_COMMUNITY): Payer: Self-pay | Admitting: Hematology & Oncology

## 2016-06-02 ENCOUNTER — Encounter (HOSPITAL_BASED_OUTPATIENT_CLINIC_OR_DEPARTMENT_OTHER): Payer: Medicare HMO | Admitting: Hematology & Oncology

## 2016-06-02 VITALS — BP 146/53 | HR 91 | Temp 98.4°F | Resp 18 | Wt 247.7 lb

## 2016-06-02 DIAGNOSIS — E669 Obesity, unspecified: Secondary | ICD-10-CM

## 2016-06-02 DIAGNOSIS — R971 Elevated cancer antigen 125 [CA 125]: Secondary | ICD-10-CM

## 2016-06-02 DIAGNOSIS — M25559 Pain in unspecified hip: Secondary | ICD-10-CM

## 2016-06-02 DIAGNOSIS — R1010 Upper abdominal pain, unspecified: Secondary | ICD-10-CM

## 2016-06-02 DIAGNOSIS — G620 Drug-induced polyneuropathy: Secondary | ICD-10-CM

## 2016-06-02 DIAGNOSIS — R109 Unspecified abdominal pain: Secondary | ICD-10-CM

## 2016-06-02 DIAGNOSIS — T451X5A Adverse effect of antineoplastic and immunosuppressive drugs, initial encounter: Secondary | ICD-10-CM

## 2016-06-02 DIAGNOSIS — C569 Malignant neoplasm of unspecified ovary: Secondary | ICD-10-CM

## 2016-06-02 DIAGNOSIS — C541 Malignant neoplasm of endometrium: Secondary | ICD-10-CM

## 2016-06-02 DIAGNOSIS — Z95828 Presence of other vascular implants and grafts: Secondary | ICD-10-CM

## 2016-06-02 MED ORDER — PREGABALIN 75 MG PO CAPS: 75.0000 mg | ORAL_CAPSULE | Freq: Every day | ORAL | 1 refills | Status: DC

## 2016-06-02 MED ORDER — OXYCODONE-ACETAMINOPHEN 10-325 MG PO TABS: 1.0000 | ORAL_TABLET | ORAL | 0 refills | Status: DC | PRN

## 2016-06-02 NOTE — Patient Instructions (Addendum)
[place] at [facility] Discharge Instructions  RECOMMENDATIONS MADE BY THE CONSULTANT AND ANY TEST RESULTS WILL BE SENT TO YOUR REFERRING PHYSICIAN.  You saw Dr.Penland today.  PET SCAN, Return to clinic post PET.  See Amy at checkout for appointments.  Thank you for choosing [place] at [facility] to provide your oncology and hematology care.  To afford each patient quality time with our provider, please arrive at least 15 minutes before your scheduled appointment time.   Beginning January 23rd 2017 lab work for the [facility] will be done in the  Main lab at [facility] on 1st floor. If you have a lab appointment with the [facility] please come in thru the  Main Entrance and check in at the main information desk  You need to re-schedule your appointment should you arrive 10 or more minutes late.  We strive to give you quality time with our providers, and arriving late affects you and other patients whose appointments are after yours.  Also, if you no show three or more times for appointments you may be dismissed from the clinic at the providers discretion.     Again, thank you for choosing [facility].  Our hope is that these requests will decrease the amount of time that you wait before being seen by our physicians.       _____________________________________________________________  Should you have questions after your visit to [facility], please contact our office at [phone] between the hours of 8:30 a.m. and 4:30 p.m.  Voicemails left after 4:30 p.m. will not be returned until the following business day.  For prescription refill requests, have your pharmacy contact our office.         Resources For Cancer Patients and their Caregivers ? American Cancer Society: Can assist with transportation, wigs, general needs, runs Look Good Feel Better.        [phone] ? Cancer Care: Provides  financial assistance, online support groups, medication/co-pay assistance.  1-800-813-HOPE 2696955019) ? [place] Assists [place] cancer patients and their families through emotional , educational and financial support.  279-134-5063 ? [place] DSS Where to apply for food stamps, Medicaid and utility assistance. 646-670-5882 ? RCATS: Transportation to medical appointments. (507)180-4699 ? Social Security Administration: May apply for disability if have a Stage IV cancer. ([phone] 703-633-9306 ? [employer], Disability and Transit Services: Assists with nutrition, care and transit needs. [place] Support Programs: @10RELATIVEDAYS @ > Cancer Support Group  2nd Tuesday of the month 1pm-2pm, Journey Room  > Creative Journey  3rd Tuesday of the month 1130am-1pm, Journey Room  > Look Good Feel Better  1st Wednesday of the month 10am-12 noon, Journey Room (Call [place] to register [phone])

## 2016-06-02 NOTE — Progress Notes (Signed)
PROGRESS NOTE      Kath[place] Labrum, [place] [id number]  No diagnosis found.   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 [name] and associates, pathology could not be certain that these were not 2 separate primaries rather than one metastatic process from the uterus to the ovary.      08/15/2013 - 12/06/2013 Chemotherapy    Carboplatin/Taxol with dose reduction of 50% and Taxol due to grade 2+ peripheral neuropathy and gabapentin induced nightmare/hallucinations. 5 out of 6 cycles given with the final cycle being cancelled secondary to significant thrombocytopenia/intolerance.      09/10/2013 - 10/17/2013 Radiation Therapy    5040 cGy delivered by EBRT followed by intravaginal brachytherapy.      04/30/2014 PET scan    PET scan consistent with recurrent disease of either ovarian cancer or endometrial cancer.      06/03/2014 - 07/15/2014 Radiation Therapy    5040 cGy over 28 fractions delivered to the low periaortic area lymph node either metastatic endometrial or metastatic or ovarian cancer.      07/19/2014 Imaging    Imaging concerning for new mesenteric disease and subdiaphragmatic disease.      08/05/2014 PET scan    No evidence of progression of disease.      08/05/2014 Remission    Negative PET scan.       CURRENT THERAPY: Surveillance per NCCN guidelines  INTERVAL HISTORY: Kathleen Whitehead 65 y.o. female returns for followup of Ovarian Cancer/Endometrial cancer, S/P TAH-BSO by Dr. [name] with unsure pathology regarding one primary with metastatic disease versus two separate primaries followed by Carboplatin/Taxol x 5/6 cycles (last cycle cancelled due to intolerance and thrombocytopenia), followed by EBRT and intravaginal brachytherapy.  She did have a recurrence in October 2015 on PET imaging requiring another treatment with EBRT.  Finishing all therapy on  07/15/2014.  Pre-operative CA 125 was elevated.  Kathleen Whitehead presents to the cancer center today accompanied by her husband. She presents in a wheel chair. I have reviewed the labs with the patient.   She declines genetics and is not up to date on her colonoscopy.   She says she's been doing "Eh." She says she hurts all of the time. She says the neuropathy in her feet causes her immobility and need for a wheelchair.  She does not ambulate much, not even at home.  She is very inactive at home as well.   She has tried a few drugs for her neuropathy such as Cymbalta, but got no relief. She has never tried Lyrica because Dr. Tressie Stalker said that it acts similarly to Cymbalta, which she had a "bad reaction" to. She had nausea and fatigue with Cymbalta.   She has been having hip pain, which she believes is from the radiation.   Dr. Juleen China did her surgery for her pelvic mass.   She has recently had her mammogram.   She has received her flu shot. She says her arm swelled up from the shot and was told to never have one again.   She has never gone to Physical therapy for her neuropathy. She would be open to going.   She denies diarrhea or constipation.  Her appetite comes and goes.   She reports a hernia on the left side of her abdomen that has been causing pain.   Today, she denies any complaints.  I have reviewed  her medical oncology chart via [place].  We discussed her pain medication needs.  She is on Percocet by Dr. Tressie Stalker, she notes that she could not afford the long acting pain medication prescribed at her last visit.   Review of Systems  Constitutional: Negative.        Uses wheelchair. Appetite comes and goes.   HENT: Negative.   Eyes: Negative.   Respiratory: Negative.   Cardiovascular: Negative.   Gastrointestinal: Positive for abdominal pain. Negative for constipation and diarrhea.       Hernia left abdomen.  Genitourinary: Negative.   Musculoskeletal: Positive  for joint pain (hip secondary to radiation).  Skin: Negative.   Neurological: Positive for tingling.       Neuropathy in feet and legs (goes up to her knee).  Endo/Heme/Allergies: Negative.   Psychiatric/Behavioral: Positive for depression.  All other systems reviewed and are negative. 14 point review of systems was performed and is negative except as detailed under history of present illness and above   Past Medical History:  Diagnosis Date  . Anemia   . Chemotherapy induced neutropenia ([place])   . Chemotherapy induced thrombocytopenia   . Diabetes mellitus without complication ([place])   . Endometrial cancer ([place])   . Hot flashes   . Hypertension   . Malignant neoplasm ([place])   . 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 Obesity   . Ovarian cancer ([place])   . Pancytopenia ([place])   . Peripheral artery disease ([place])   . Peripheral neuropathy ([place])   . Port-a-cath in place     History reviewed. No pertinent surgical history.  Family History  Problem Relation Age of Onset  . Heart failure Mother   . Stroke Sister   . Cancer Sister   . Diabetes Sister   . Leukemia Sister   . Diabetes Brother     Social History   Social History  . Marital status: Married    Spouse name: N/A  . Number of children: N/A  . Years of education: N/A   Social History Main Topics  . Smoking status: Never Smoker  . Smokeless tobacco: Never Used  . Alcohol use No  . Drug use: No  . Sexual activity: Not Asked   Other Topics Concern  . None   Social History Narrative  . None     PHYSICAL EXAMINATION  ECOG PERFORMANCE STATUS: 2 - Symptomatic, <50% confined to bed  Vitals:   06/02/16 0913  BP: (!) 146/53  Pulse: 91  Resp: 18  Temp: 98.4 F (36.9 C)    Physical Exam  Constitutional: She is oriented to person, place, and time and well-developed, well-nourished, and in no distress.  Pt was able to get on exam table with assistance. Pt uses wheel chair.  Obese  HENT:  Head:  Normocephalic and atraumatic.  Eyes: EOM are normal. Pupils are equal, round, and reactive to light. No scleral icterus.  Neck: Normal range of motion. Neck supple.  Cardiovascular: Normal rate, regular rhythm and normal heart sounds.   Pulmonary/Chest: Effort normal and breath sounds normal. No respiratory distress.  Abdominal: Soft. Bowel sounds are normal. There is tenderness. There is no rebound and no guarding.  Midline abdominal hernia.   Musculoskeletal: Normal range of motion.  Lymphadenopathy:    She has no cervical adenopathy.  Neurological: She is alert and oriented to person, place, and time. Coordination abnormal.  Slow unsteady gait  Skin: Skin is warm and dry.  Psychiatric: Mood, memory, affect  and judgment normal.  Nursing note and vitals reviewed.    LABORATORY DATA: CBC    Component Value Date/Time   WBC 7.3 05/21/2016 1519   RBC 4.10 05/21/2016 1519   HGB 12.9 05/21/2016 1519   HCT 38.3 05/21/2016 1519   PLT 160 05/21/2016 1519   MCV 93.4 05/21/2016 1519   MCH 31.5 05/21/2016 1519   MCHC 33.7 05/21/2016 1519   RDW 12.8 05/21/2016 1519   LYMPHSABS 2.1 05/21/2016 1519   MONOABS 0.3 05/21/2016 1519   EOSABS 0.1 05/21/2016 1519   BASOSABS 0.0 05/21/2016 1519      Chemistry      Component Value Date/Time   NA 137 05/21/2016 1519   K 4.7 05/21/2016 1519   CL 102 05/21/2016 1519   CO2 27 05/21/2016 1519   BUN 20 05/21/2016 1519   CREATININE 1.19 (H) 05/21/2016 1519      Component Value Date/Time   CALCIUM 10.0 05/21/2016 1519   ALKPHOS 57 05/21/2016 1519   AST 32 05/21/2016 1519   ALT 20 05/21/2016 1519   BILITOT 0.3 05/21/2016 1519     Lab Results  Component Value Date   CA125 43.7 (H) 05/21/2016   RADIOGRAPHIC STUDIES:  No results found.   ASSESSMENT AND PLAN:  Ovarian CA/Endometrial CA Carboplatin Taxol X 5 XRT Chemotherapy induced neuropathy Marginal PS Obesity Elevated CA-125 Abdominal Pain  Recent labs were reviewed with the  patient, unfortunately her CA-125 has come back elevated from priors. I have recommended repeat imaging especially given her abdominal discomfort.   I have ordered her a PET scan for next week.   I have written her a prescription for Lyrica. I believe that she would benefit from PT. She is grossly inactive.   She will need more oxycodone-acetaminophen, but she can not refill it until December 7. We discussed optimal treatment of neuropathy. She is open to trying lyrica.   She will return for a follow up after her PET scan is done to review and for further recommendations.   ORDERS PLACED FOR THIS ENCOUNTER: No orders of the defined types were placed in this encounter.  Orders Placed This Encounter  Procedures  . NM PET Image Restag (PS) Skull Base To Thigh    Standing Status:   Future    Standing Expiration Date:   06/02/2017    Order Specific Question:   Reason for Exam (SYMPTOM  OR DIAGNOSIS REQUIRED)    Answer:   restaging endometrial and ovarian, elevated CA 125 with increasing abdominal pain    Order Specific Question:   Preferred imaging location?    Answer:   [facility]    Order Specific Question:   If indicated for the ordered procedure, I authorize the administration of a radiopharmaceutical per Radiology protocol    Answer:   Yes   Meds ordered this encounter  Medications  . pregabalin (LYRICA) 75 MG capsule    Sig: Take 1 capsule (75 mg total) by mouth daily.    Dispense:  30 capsule    Refill:  1  . oxyCODONE-acetaminophen (PERCOCET) 10-325 MG tablet    Sig: Take 1 tablet by mouth every 4 (four) hours as needed for pain. Not due for refill until 06/10/2016    Dispense:  180 tablet    Refill:  0    THERAPY PLAN:  NCCN guidelines for surveillance of Stage I-IV Epithelial Ovarian Cancer/ Fallopian Tube Cancer/ Primary Peritoneal Cancer in those experiencing a complete response recommends the following (1.2017):  A. Vists every 2-4 months for first 2 years,  then 3-6 months for 3 years, and then annually after 5 years.  B. Physical exam including pelvic exam  C. CA-125 or other tumor markers if initially elevated.  D. Refer for genetic risk evaluation if not previously performed.   E. CBC and chemistry profile as indicated.   F. CT Chest/Abdomen/Pelvis, MRI, PET-CT, or PET (skull base to mid-thigh) as clinically indicated.  G. Chest X-ray as indicted.   H. Long-term wellness care.  All questions were answered. The patient knows to call the clinic with any problems, questions or concerns. We can certainly see the patient much sooner if necessary.  This document serves as a record of services personally performed by Ancil Linsey, MD. It was created on her behalf by Martinique Casey, a trained medical scribe. The creation of this record is based on the scribe's personal observations and the provider's statements to them. This document has been checked and approved by the attending provider.  I have reviewed the above documentation for accuracy and completeness and I agree with the above.  This note was electronically signed.   This note is electronically signed [id number] Cyril Mourning, MD  06/02/2016 9:22 AM

## 2016-06-03 ENCOUNTER — Encounter (HOSPITAL_COMMUNITY): Payer: Self-pay | Admitting: Hematology & Oncology

## 2016-06-11 ENCOUNTER — Encounter (HOSPITAL_COMMUNITY)
Admission: RE | Admit: 2016-06-11 | Discharge: 2016-06-11 | Disposition: A | Discharge status: Home / Self Care | Payer: Medicare HMO | Source: Ambulatory Visit | Attending: Hematology & Oncology | Admitting: Hematology & Oncology

## 2016-06-11 DIAGNOSIS — C541 Malignant neoplasm of endometrium: Secondary | ICD-10-CM

## 2016-06-11 DIAGNOSIS — C569 Malignant neoplasm of unspecified ovary: Secondary | ICD-10-CM | POA: Insufficient documentation

## 2016-06-11 DIAGNOSIS — C55 Malignant neoplasm of uterus, part unspecified: Secondary | ICD-10-CM | POA: Diagnosis not present

## 2016-06-11 LAB — GLUCOSE, CAPILLARY: Glucose-Capillary: 86 mg/dL (ref 65–99)

## 2016-06-11 MED ORDER — FLUDEOXYGLUCOSE F - 18 (FDG) INJECTION
12.2900 | Freq: Once | INTRAVENOUS | Status: AC | PRN
  Administered 2016-06-11: 12.29 via INTRAVENOUS

## 2016-06-15 ENCOUNTER — Encounter (HOSPITAL_COMMUNITY): Payer: Self-pay | Admitting: Hematology & Oncology

## 2016-06-15 ENCOUNTER — Encounter (HOSPITAL_COMMUNITY): Payer: Medicare HMO | Attending: Hematology | Admitting: Hematology & Oncology

## 2016-06-15 VITALS — BP 166/57 | HR 84 | Temp 98.4°F | Resp 16 | Wt 251.0 lb

## 2016-06-15 DIAGNOSIS — T451X5A Adverse effect of antineoplastic and immunosuppressive drugs, initial encounter: Secondary | ICD-10-CM

## 2016-06-15 DIAGNOSIS — G620 Drug-induced polyneuropathy: Secondary | ICD-10-CM

## 2016-06-15 DIAGNOSIS — C541 Malignant neoplasm of endometrium: Secondary | ICD-10-CM | POA: Insufficient documentation

## 2016-06-15 DIAGNOSIS — Z95828 Presence of other vascular implants and grafts: Secondary | ICD-10-CM | POA: Insufficient documentation

## 2016-06-15 DIAGNOSIS — C569 Malignant neoplasm of unspecified ovary: Secondary | ICD-10-CM | POA: Insufficient documentation

## 2016-06-15 DIAGNOSIS — R109 Unspecified abdominal pain: Secondary | ICD-10-CM

## 2016-06-15 DIAGNOSIS — R1031 Right lower quadrant pain: Secondary | ICD-10-CM

## 2016-06-15 DIAGNOSIS — R971 Elevated cancer antigen 125 [CA 125]: Secondary | ICD-10-CM

## 2016-06-15 DIAGNOSIS — E669 Obesity, unspecified: Secondary | ICD-10-CM

## 2016-06-15 NOTE — Patient Instructions (Signed)
[place] at [facility] Discharge Instructions  RECOMMENDATIONS MADE BY THE CONSULTANT AND ANY TEST RESULTS WILL BE SENT TO YOUR REFERRING PHYSICIAN.  You saw Dr.Penland today. Dr. Whitney Muse is talking to radiation oncologist. We will call with appointments. See Amy at checkout for appointments.  Thank you for choosing [place] at [facility] to provide your oncology and hematology care.  To afford each patient quality time with our provider, please arrive at least 15 minutes before your scheduled appointment time.   Beginning January 23rd 2017 lab work for the [facility] will be done in the  Main lab at [facility] on 1st floor. If you have a lab appointment with the [facility] please come in thru the  Main Entrance and check in at the main information desk  You need to re-schedule your appointment should you arrive 10 or more minutes late.  We strive to give you quality time with our providers, and arriving late affects you and other patients whose appointments are after yours.  Also, if you no show three or more times for appointments you may be dismissed from the clinic at the providers discretion.     Again, thank you for choosing [facility].  Our hope is that these requests will decrease the amount of time that you wait before being seen by our physicians.       _____________________________________________________________  Should you have questions after your visit to [facility], please contact our office at [phone] between the hours of 8:30 a.m. and 4:30 p.m.  Voicemails left after 4:30 p.m. will not be returned until the following business day.  For prescription refill requests, have your pharmacy contact our office.         Resources For Cancer Patients and their Caregivers ? American Cancer Society: Can assist with transportation, wigs, general needs, runs Look Good Feel Better.         ([phone] [phone] ? Cancer Care: Provides financial assistance, online support groups, medication/co-pay assistance.  1-800-813-HOPE 782 338 0200) ? [place] Assists [place] cancer patients and their families through emotional , educational and financial support.  470-661-4599 ? [place] DSS Where to apply for food stamps, Medicaid and utility assistance. 279-433-2426 ? RCATS: Transportation to medical appointments. (516) [phone] ? Social Security Administration: May apply for disability if have a Stage IV cancer. ([phone] 814-846-1462 ? [employer], Disability and Transit Services: Assists with nutrition, care and transit needs. [place] Support Programs: @10RELATIVEDAYS @ > Cancer Support Group  2nd Tuesday of the month 1pm-2pm, Journey Room  > Creative Journey  3rd Tuesday of the month 1130am-1pm, Journey Room  > Look Good Feel Better  1st Wednesday of the month 10am-12 noon, Journey Room (Call [place] to register [phone])

## 2016-06-15 NOTE — Progress Notes (Signed)
PROGRESS NOTE      [name], MD [place] [id number]  No diagnosis found.   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 [name] and associates, pathology could not be certain that these were not 2 separate primaries rather than one metastatic process from the uterus to the ovary.      08/15/2013 - 12/06/2013 Chemotherapy    Carboplatin/Taxol with dose reduction of 50% and Taxol due to grade 2+ peripheral neuropathy and gabapentin induced nightmare/hallucinations. 5 out of 6 cycles given with the final cycle being cancelled secondary to significant thrombocytopenia/intolerance.      09/10/2013 - 10/17/2013 Radiation Therapy    5040 cGy delivered by EBRT followed by intravaginal brachytherapy.      04/30/2014 PET scan    PET scan consistent with recurrent disease of either ovarian cancer or endometrial cancer.      06/03/2014 - 07/15/2014 Radiation Therapy    5040 cGy over 28 fractions delivered to the low periaortic area lymph node either metastatic endometrial or metastatic or ovarian cancer.      07/19/2014 Imaging    Imaging concerning for new mesenteric disease and subdiaphragmatic disease.      08/05/2014 PET scan    No evidence of progression of disease.      08/05/2014 Remission    Negative PET scan.      06/11/2016 PET scan    1. Hypermetabolic aortocaval and right common iliac adenopathy, maximum SUV 17.1, compatible with malignancy. 2. Calcified left mesenteric mass has only low-grade activity, maximum SUV 5.3, merit surveillance. 3. There several small calcifications along the omentum which are not currently metabolically active. 4. Along the scarring in operative site from prior laparotomy there some faintly accentuated metabolic activity which is probably related to the original wound and unlikely to be from tumor. 5. Coronary, aortic arch, and branch vessel  atherosclerotic vascular disease.       CURRENT THERAPY: Surveillance per NCCN guidelines  INTERVAL HISTORY: Kathleen Whitehead 65 y.o. female returns for followup of Ovarian Cancer/Endometrial cancer, S/P TAH-BSO by Dr. [name] with unsure pathology regarding one primary with metastatic disease versus two separate primaries followed by Carboplatin/Taxol x 5/6 cycles (last cycle cancelled due to intolerance and thrombocytopenia), followed by EBRT and intravaginal brachytherapy.  She did have a recurrence in October 2015 on PET imaging requiring another treatment with EBRT.  Finishing all therapy on 07/15/2014.  Pre-operative CA 125 was elevated.  Patient is accompanied by her husband. She reports pain in right lower abdomen. She notes otherwise she is ok. Her neuropathy is very limiting for her and unchanged. She has tried multiple meds for this. We have discussed this before.   PET/CT was reviewed with the patient and her husband.  Dr. Sondra Come performed her radiation in the past. She notes that she is not interested in chemotherapy. She would consider XRT.     Review of Systems  Constitutional: Negative.        Uses wheelchair. Appetite comes and goes.   HENT: Negative.   Eyes: Negative.   Respiratory: Negative.   Cardiovascular: Negative.   Gastrointestinal: Positive for abdominal pain. Negative for constipation and diarrhea.       Hernia left abdomen.  Genitourinary: Negative.   Musculoskeletal: Positive for joint pain (hip secondary to radiation).  Skin: Negative.   Neurological: Positive for tingling.       Neuropathy  in feet and legs (goes up to her knee).  Endo/Heme/Allergies: Negative.   Psychiatric/Behavioral: Positive for depression.  All other systems reviewed and are negative. 14 point review of systems was performed and is negative except as detailed under history of present illness and above   Past Medical History:  Diagnosis Date  . Anemia   . 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 Chemotherapy induced thrombocytopenia   . Diabetes mellitus without complication ([place])   . Endometrial cancer ([place])   . Hot flashes   . Hypertension   . Malignant neoplasm ([place])   . 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 Obesity   . Ovarian cancer ([place])   . Pancytopenia ([place])   . Peripheral artery disease ([place])   . Peripheral neuropathy ([place])   . Port-a-cath in place     History reviewed. No pertinent surgical history.  Family History  Problem Relation Age of Onset  . Heart failure Mother   . Stroke Sister   . Cancer Sister   . Diabetes Sister   . Leukemia Sister   . Diabetes Brother     Social History   Social History  . Marital status: Married    Spouse name: N/A  . Number of children: N/A  . Years of education: N/A   Social History Main Topics  . Smoking status: Never Smoker  . Smokeless tobacco: Never Used  . Alcohol use No  . Drug use: No  . Sexual activity: Not Asked   Other Topics Concern  . None   Social History Narrative  . None     PHYSICAL EXAMINATION  ECOG PERFORMANCE STATUS: 2 - Symptomatic, <50% confined to bed  Vitals:   06/15/16 1436  BP: (!) 166/57  Pulse: 84  Resp: 16  Temp: 98.4 F (36.9 C)    Vitals - 1 value per visit [id number]  SYSTOLIC [id number]  DIASTOLIC 57  Pulse 84  Temperature 98.4  Respirations 16  Weight (lb) 251    Physical Exam  Constitutional: She is oriented to person, place, and time and well-developed, well-nourished, and in no distress.  Pt was able to get on exam table with assistance. Pt uses wheel chair.  Obese  HENT:  Head: Normocephalic and atraumatic.  Eyes: EOM are normal. Pupils are equal, round, and reactive to light. No scleral icterus.  Neck: Normal range of motion. Neck supple.  Cardiovascular: Normal rate, regular rhythm and normal heart sounds.   Pulmonary/Chest: Effort normal and breath sounds normal. No respiratory distress.  Abdominal: Soft. Bowel  sounds are normal. There is tenderness. There is no rebound and no guarding.  Midline abdominal hernia.   Musculoskeletal: Normal range of motion.  Lymphadenopathy:    She has no cervical adenopathy.  Neurological: She is alert and oriented to person, place, and time. Coordination abnormal.  Slow unsteady gait  Skin: Skin is warm and dry.  Psychiatric: Mood, memory, affect and judgment normal.  Nursing note and vitals reviewed.    LABORATORY DATA: CBC    Component Value Date/Time   WBC 7.3 05/21/2016 1519   RBC 4.10 05/21/2016 1519   HGB 12.9 05/21/2016 1519   HCT 38.3 05/21/2016 1519   PLT 160 05/21/2016 1519   MCV 93.4 05/21/2016 1519   MCH 31.5 05/21/2016 1519   MCHC 33.7 05/21/2016 1519   RDW 12.8 05/21/2016 1519   LYMPHSABS 2.1 05/21/2016 1519   MONOABS 0.3 05/21/2016 1519   EOSABS 0.1 05/21/2016 1519   BASOSABS 0.0  05/21/2016 1519      Chemistry      Component Value Date/Time   NA 137 05/21/2016 1519   K 4.7 05/21/2016 1519   CL 102 05/21/2016 1519   CO2 27 05/21/2016 1519   BUN 20 05/21/2016 1519   CREATININE 1.19 (H) 05/21/2016 1519      Component Value Date/Time   CALCIUM 10.0 05/21/2016 1519   ALKPHOS 57 05/21/2016 1519   AST 32 05/21/2016 1519   ALT 20 05/21/2016 1519   BILITOT 0.3 05/21/2016 1519     Results for JOHONNA, RUGEN (MRN [id number])   Ref. Range 02/27/2016 15:00 05/21/2016 15:19  CA 125 Latest Ref Range: 0.0 - 38.1 U/mL 19.7 43.7 (H)    RADIOGRAPHIC STUDIES:  Nm Pet Image Restag (ps) Skull Base To Thigh  Result Date: 06/11/2016 CLINICAL DATA:  Subsequent Treatment strategy for uterine and ovarian cancer with increasing tumor markers appear. EXAM: NUCLEAR MEDICINE PET SKULL BASE TO THIGH TECHNIQUE: 12.3 mCi F-18 FDG was injected intravenously. Full-ring PET imaging was performed from the skull base to thigh after the radiotracer. CT data was obtained and used for attenuation correction and anatomic localization. FASTING BLOOD GLUCOSE:   Value: 86 mg/dl COMPARISON:  Multiple exams, including CT abdomen and pelvis dated 07/19/2014 FINDINGS: NECK No hypermetabolic lymph nodes in the neck. CHEST No hypermetabolic mediastinal or hilar nodes. No suspicious pulmonary nodules on the CT data. Coronary, aortic arch, and branch vessel atherosclerotic vascular disease. ABDOMEN/PELVIS No abnormal hypermetabolic activity within the liver, pancreas, adrenal glands, or spleen. Clustered right periaortic lymph nodes below the level of the renal veins, the primarily posterior to the IVC, measuring up to about 1.3 cm in short axis on image 130/4, maximum SUV 17.1. Abnormal hypermetabolic nodes extend into the right common iliac chain, index right iliac node 1.1 cm in short axis on image 143/4. These nodes are increased compared to 07/19/14. Calcified left mesenteric mass measuring 3.3 by 2.2 cm, metabolic activity is similar to the surrounding small bowel, maximum SUV approximately 5.3 Along the surgical site in the right rectus abdominus there is some very faintly increased activity, maximum SUV 4.0, probably simply postoperative. Faint curvilinear calcification in the left upper quadrant near the splenic flexure on image 122/4 is not hypermetabolic and a least 6 mm in diameter. There some small calcifications along the omentum. Chronic presacral edema.  Uterus and ovaries absent. SKELETON No focal hypermetabolic activity to suggest skeletal metastasis. IMPRESSION: 1. Hypermetabolic aortocaval and right common iliac adenopathy, maximum SUV 17.1, compatible with malignancy. 2. Calcified left mesenteric mass has only low-grade activity, maximum SUV 5.3, merit surveillance. 3. There several small calcifications along the omentum which are not currently metabolically active. 4. Along the scarring in operative site from prior laparotomy there some faintly accentuated metabolic activity which is probably related to the original wound and unlikely to be from tumor. 5.  Coronary, aortic arch, and branch vessel atherosclerotic vascular disease. Electronically Signed   By: Van Clines M.D.   On: 06/11/2016 13:05     ASSESSMENT AND PLAN:  Ovarian CA/Endometrial CA Carboplatin Taxol X 5 XRT Chemotherapy induced neuropathy Marginal PS Obesity Elevated CA-125 Abdominal Pain  Patient is pleasant. I reviewed the results of PET scan with her. Results are noted above.  She is not very interested in chemotherapy, she wants to know if there is anyway she can receive additional XRT. I will contact Dr. Sondra Come and ask if she received radiation to these areas in the past. Will continue  to monitor the left side for the time being.   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  She is now on Lyrica.  I think she would benefit from PT too. She is hesitant but willing to consider a referral.   I will follow up with Kathleen Whitehead after I have consulted with Dr. Sondra Come about possible treatment options. We will regroup post.   THERAPY PLAN:  NCCN guidelines for surveillance of Stage I-IV Epithelial Ovarian Cancer/ Fallopian Tube Cancer/ Primary Peritoneal Cancer in those experiencing a complete response recommends the following (1.2017):  A. Vists every 2-4 months for first 2 years, then 3-6 months for 3 years, and then annually after 5 years.  B. Physical exam including pelvic exam  C. CA-125 or other tumor markers if initially elevated.  D. Refer for genetic risk evaluation if not previously performed.   E. CBC and chemistry profile as indicated.   F. CT Chest/Abdomen/Pelvis, MRI, PET-CT, or PET (skull base to mid-thigh) as clinically indicated.  G. Chest X-ray as indicted.   H. Long-term wellness care.  All questions were answered. The patient knows to call the clinic with any problems, questions or concerns. We can certainly see the patient much sooner if necessary.  This document serves as a record of services personally performed by  Ancil Linsey, MD. It was created on her behalf by Kathleen Whitehead, a trained medical scribe. The creation of this record is based on the scribe's personal observations and the provider's statements to them. This document has been checked and approved by the attending provider.  I have reviewed the above documentation for accuracy and completeness and I agree with the above.  This note was electronically signed.   This note is electronically signed [id number] Elio Forget, MD  07/10/2016 6:15 PM

## 2016-07-07 ENCOUNTER — Other Ambulatory Visit (HOSPITAL_COMMUNITY): Payer: Self-pay | Admitting: Oncology

## 2016-07-07 DIAGNOSIS — C569 Malignant neoplasm of unspecified ovary: Secondary | ICD-10-CM

## 2016-07-07 DIAGNOSIS — Z95828 Presence of other vascular implants and grafts: Secondary | ICD-10-CM

## 2016-07-07 MED ORDER — OXYCODONE-ACETAMINOPHEN 10-325 MG PO TABS: 1.0000 | ORAL_TABLET | ORAL | 0 refills | Status: DC | PRN

## 2016-07-10 ENCOUNTER — Encounter (HOSPITAL_COMMUNITY): Payer: Self-pay | Admitting: Hematology & Oncology

## 2016-07-19 DIAGNOSIS — I10 Essential (primary) hypertension: Secondary | ICD-10-CM | POA: Diagnosis not present

## 2016-07-19 DIAGNOSIS — E559 Vitamin D deficiency, unspecified: Secondary | ICD-10-CM | POA: Diagnosis not present

## 2016-07-19 DIAGNOSIS — E782 Mixed hyperlipidemia: Secondary | ICD-10-CM | POA: Diagnosis not present

## 2016-07-19 DIAGNOSIS — E1122 Type 2 diabetes mellitus with diabetic chronic kidney disease: Secondary | ICD-10-CM | POA: Diagnosis not present

## 2016-07-19 DIAGNOSIS — D696 Thrombocytopenia, unspecified: Secondary | ICD-10-CM | POA: Diagnosis not present

## 2016-07-19 DIAGNOSIS — N183 Chronic kidney disease, stage 3 (moderate): Secondary | ICD-10-CM | POA: Diagnosis not present

## 2016-07-19 DIAGNOSIS — E1143 Type 2 diabetes mellitus with diabetic autonomic (poly)neuropathy: Secondary | ICD-10-CM | POA: Diagnosis not present

## 2016-07-26 ENCOUNTER — Encounter (HOSPITAL_COMMUNITY): Payer: Self-pay | Admitting: Emergency Medicine

## 2016-07-26 ENCOUNTER — Other Ambulatory Visit (HOSPITAL_COMMUNITY): Payer: Self-pay | Admitting: Emergency Medicine

## 2016-07-26 ENCOUNTER — Ambulatory Visit (HOSPITAL_COMMUNITY): Payer: Medicare HMO | Admitting: Hematology & Oncology

## 2016-07-26 MED ORDER — PROCHLORPERAZINE MALEATE 10 MG PO TABS: 10.0000 mg | ORAL_TABLET | Freq: Four times a day (QID) | ORAL | 2 refills | Status: DC | PRN

## 2016-07-26 MED ORDER — ONDANSETRON HCL 8 MG PO TABS: 8.0000 mg | ORAL_TABLET | Freq: Three times a day (TID) | ORAL | 2 refills | Status: DC | PRN

## 2016-07-26 MED ORDER — LIDOCAINE-PRILOCAINE 2.5-2.5 % EX CREA: TOPICAL_CREAM | CUTANEOUS | 2 refills | Status: DC

## 2016-07-26 NOTE — Progress Notes (Signed)
Chemotherapy pulled together and appts made. 

## 2016-07-26 NOTE — Patient Instructions (Signed)
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  We are going to treat you with carboplatin and gemzar. One cycle is every 21 days.  You will be treated on day 1 and day 8 every 21 days.  On day 1 you will be treated with carboplatin and gemzar and on day 8 you will be treated with gemzar.  This treatment is with palliative intent, which means you are treatable but not curable.   You will see the doctor regularly throughout treatment.  We monitor your lab work prior to every treatment.  The doctor monitors your response to treatment by the way you are feeling, your blood work, and scans periodically.  You will receive the following the following pre-medications prior to each chemotherapy: Premeds: Aloxi - high powered nausea/vomiting prevention medication used for chemotherapy patients.  Dexamethasone - steroid - given to reduce the risk of you having an allergic type reaction to the chemotherapy. Dex can cause you to feel energized, nervous/anxious/jittery, make you have trouble sleeping, and/or make you feel hot/flushed in the face/neck and/or look pink/red in the face/neck. These side effects will pass as the Dex wears off. (takes 20 minutes to infuse)  Compazine:  Nausea pill pre-medication given on day 8 prior to Gemzar    POTENTIAL SIDE EFFECTS OF TREATMENT:  Carboplatin (Generic Name) Other Names: Paraplatin, CBDCA  About This Drug Carboplatin is a drug used to treat cancer. This drug is given in the vein (IV).  Possible Side Effects (More Common) . Nausea and throwing up (vomiting). These symptoms may happen within a few hours after your treatment and may last up to 24 hours. Medicines are available to stop or lessen these side effects. . Bone marrow depression. This is a decrease in the number of white blood cells, red blood cells, and platelets. This may raise your risk of infection, make you tired and weak (fatigue), and raise  your risk of bleeding. . Soreness of the mouth and throat. You may have red areas, white patches, or sores that hurt. . This drug may affect how your kidneys work. Your kidney function will be checked as needed. . Electrolyte changes. Your blood will be checked for electrolyte changes as needed.  Possible Side Effects (Less Common) . Hair loss. Some patients lose their hair on the scalp and body. You may notice your hair thinning seven to 14 days after getting this drug. . Effects on the nerves are called peripheral neuropathy. You may feel numbness, tingling, or pain in your hands and feet. It may be hard for you to button your clothes, open jars, or walk as usual. The effect on the nerves may get worse with more doses of the drug. These effects get better in some people after the drug is stopped but it does not get better in all people. . Loose bowel movements (diarrhea) that may last for several days . Decreased hearing or ringing in the ears . Changes in the way food and drinks taste . Changes in liver function. Your liver function will be checked as needed.  Allergic Reactions Serious allergic reactions including anaphylaxis are rare. While you are getting this drug in your vein (IV), tell your nurse right away if you have any of these symptoms of an allergic reaction: . Trouble catching your breath . Feeling like your tongue or throat are swelling . Feeling your heart beat quickly or in a not normal way (palpitations) . Feeling dizzy  or lightheaded . Flushing, itching, rash, and/or hives  Treating Side Effects . Drink 6-8 cups of fluids each day unless your doctor has told you to limit your fluid intake due to some other health problem. A cup is 8 ounces of fluid. If you throw up or have loose bowel movements, you should drink more fluids so that you do not become dehydrated (lack water in the body from losing too much fluid). . Mouth care is very important. Your mouth care should  consist of routine, gentle cleaning of your teeth or dentures and rinsing your mouth with a mixture of 1/2 teaspoon of salt in 8 ounces of water or  teaspoon of baking soda in 8 ounces of water. This should be done at least after each meal and at bedtime. . If you have mouth sores, avoid mouthwash that has alcohol. Avoid alcohol and smoking because they can bother your mouth and throat. . If you have numbness and tingling in your hands and feet, be careful when cooking, walking, and handling sharp objects and hot liquids. . Talk with your nurse about getting a wig before you lose your hair. Also, call the [facility] at 800-ACS-2345 to find out information about the "Look Good, Feel Better" program close to where you live. It is a free program where women getting chemotherapy can learn about wigs, turbans and scarves as well as makeup techniques and skin and nail care.  Food and Drug Interactions There are no known interactions of carboplatin with food. This drug may interact with other medicines. Tell your doctor and pharmacist about all the medicines and dietary supplements (vitamins, minerals, herbs and others) that you are taking at this time. The safety and use of dietary supplements and alternative diets are often not known. Using these might affect your cancer or interfere with your treatment. Until more is known, you should not use dietary supplements or alternative diets without your cancer doctor's help.  When to Call the Doctor Call your doctor or nurse right away if you have any of these symptoms: . Fever of 100.5 F (38 C) or above; chills . Bleeding or bruising that is not normal . Wheezing or trouble breathing . Nausea that stops you from eating or drinking . Throwing up more than once a day . Rash or itching . Loose bowel movements (diarrhea) more than four times a day or diarrhea with weakness or feeling lightheaded . Call your doctor or nurse as soon as possible if any  of these symptoms happen: . Numbness, tingling, decreased feeling or weakness in fingers, toes, arms, or legs . Change in hearing, ringing in the ears . Blurred vision or other changes in eyesight . Decreased urine . Yellowing of skin or eyes  Sexual Problems and Reproductive Concerns Sexual problems and reproduction concerns may happen. In both men and women, this drug may affect your ability to have children. This cannot be determined before your treatment. Talk with your doctor or nurse if you plan to have children. Ask for information on sperm or egg banking. In men, this drug may interfere with your ability to make sperm, but it should not change your ability to have sexual relations. In women, menstrual bleeding may become irregular or stop while you are getting this drug. Do not assume that you cannot become pregnant if you do not have a menstrual period. Women may go through signs of menopause (change of life) like vaginal dryness or itching. Vaginal lubricants can be used  to lessen vaginal dryness, itching, and pain during sexual relations. Genetic counseling is available for you to talk about the effects of this drug therapy on future pregnancies. Also, a genetic counselor can look at the possible risk of problems in the unborn baby due to this medicine if an exposure happens during pregnancy. . Pregnancy warning: This drug may have harmful effects on the unborn child, so effective methods of birth control should be used during your cancer treatment. . Breast feeding warning: It is not known if this drug passes into breast milk. For this reason, women should talk to their doctor about the risks and benefits of breast feeding during treatment with this drug because this drug may enter the breast milk and badly harm a breast feeding baby.    Gemcitabine (Generic Name) Other Names: Gemzar  About This Drug Gemcitabine is used to treat cancer. This drug is given in the vein (IV).  Possible  Side Effects (Most Common) . Bone marrow depression. This is a decrease in the number of white blood cells, red blood cells, and platelets. This may raise your risk of infection, make you tired and weak (fatigue), and raise your risk of bleeding. . Nausea and throwing up (vomiting). These symptoms may happen within a few hours after your treatment and may last up to several days. Medicines are available to stop or lessen these side effects. . Loose bowel movements (diarrhea) that may last for a few days . Fluid retention. You may have swelling of your arms, legs, face, chest or abdomen . Raised, red rash on arms, legs, back, or chest . Flu-like symptoms: fever, headache, muscle and joint aches, and fatigue (low energy, feeling weak) . Changes in your liver function. Your doctor will check your liver function as needed.  Possible Side Effects (Less Common) . Feeling short of breath . Decreased appetite (decreased hunger) . Effects on the nerves are called peripheral neuropathy. You may feel numbness, tingling, or pain in your hands and feet. It may be hard for you to button your clothes, open jars, or walk as usual. The effect on the nerves may get worse with more doses of the drug. These effects get better in some people after the drug is stopped but it does not get better in all people.  Treating Side Effects . Ask your doctor or nurse about medication that is available to help stop or lessen nausea, throwing up, loose bowel movements, headache, muscle and joint aches. . Drink 6-8 cups of fluids each day unless your doctor has told you to limit your fluid intake due to some other health problem. A cup is 8 ounces of fluid. If you throw up or have loose bowel movements you should drink more fluids so that you do not become dehydrated (lack water in the body due to losing too much fluid). . If you get a rash, do not put anything on it unless your doctor or nurse says you may. Keep the area around the  rash clean and dry. Ask your doctor for medicine if your rash bothers you. . If you have numbness and tingling in your hands and feet, be careful when cooking, walking, and handling sharp objects and hot liquids.  Food and Drug Interactions There are no known interactions of gemcitabine with food. This drug may interact with other medicines. Tell your doctor and pharmacist about all the medicines and dietary supplements (vitamins, minerals, herbs and others) that you are taking at this time. The safety  and use of dietary supplements and alternative diets are often not known. Using these might affect your cancer or interfere with your treatment. Until more is known, you should not use dietary supplements or alternative diets without your cancer doctor's help.  When to Call the Doctor Call your doctor or nurse right away if you have any of these symptoms: . Fever of 100.5 F (38 C) or above . Chills . Bleeding or bruising that is not normal . Wheezing or trouble breathing . Rash or itching . Nausea that stops you from eating or drinking . Loose bowel movements (diarrhea) more than 4 times a day or diarrhea with weakness or lightheadedness . Swelling of your arms, legs, face, chest or abdomen (fluid retention) Call your doctor or nurse as soon as possible if any of these symptoms happen: . Numbness, tingling, decreased feeling or weakness in fingers, toes, arms, or legs . Trouble walking or changes in the way you walk, feeling clumsy when buttoning clothes, opening jars, or other routine hand motions . Weight gain of 5 pounds in one week (fluid retention) . Lasting loss of appetite or rapid weight loss of five pounds in a week . Fatigue that interferes with your daily activities  Sexual Problems and Reproduction Concerns . Infertility warning: Sexual problems and reproduction concerns may happen. In both men and women, this drug may affect your ability to have children. This cannot be determined  before your treatment. Talk with your doctor or nurse if you plan to have children. Ask for information on sperm or egg banking. . In men, this drug may interfere with your ability to make sperm, but it should not change your ability to have sexual relations. . In women, menstrual bleeding may become irregular or stop while you are getting this drug. Do not assume that you cannot become pregnant if you do not have a menstrual period. . Women may go through signs of menopause (change of life) like vaginal dryness or itching. Vaginal lubricants can be used to lessen vaginal dryness, itching, and pain during sexual relations. . Genetic counseling is available for you to talk about the effects of this drug therapy on future pregnancies. Also, a genetic counselor can look at the possible risk of problems in the unborn baby due to this medicine if an exposure happens during pregnancy. . Pregnancy warning: This drug may have harmful effects on the unborn child, so effective methods of birth control should be used during your cancer treatment. . Breast feeding warning: It is not known if this drug passes into breast milk. For this reason, women should talk to their doctor about the risks and benefits of breast feeding during treatment with this drug because this drug may enter the breast milk and badly harm a breast feeding baby.   EDUCATIONAL MATERIALS GIVEN AND REVIEWED: Chemotherapy and you book given, nutrition book given, information on gemzar and carboplatin.    SELF CARE ACTIVITIES WHILE ON CHEMOTHERAPY: Hydration Increase your fluid intake 48 hours prior to treatment and drink at least 8 to 12 cups (64 ounces) of water/decaff beverages per day after treatment. You can still have your cup of coffee or soda but these beverages do not count as part of your 8 to 12 cups that you need to drink daily. No alcohol intake.  Medications Continue taking your normal prescription medication as prescribed.  If  you start any new herbal or new supplements please let us know first to make sure it is safe.  Mouth Care Have teeth cleaned professionally before starting treatment. Keep dentures and partial plates clean. Use soft toothbrush and do not use mouthwashes that contain alcohol. Biotene is a good mouthwash that is available at most pharmacies or may be ordered by calling [phone]. Use warm salt water gargles (1 teaspoon salt per 1 quart warm water) before and after meals and at bedtime. Or you may rinse with 2 tablespoons of three-percent hydrogen peroxide mixed in eight ounces of water. If you are still having problems with your mouth or sores in your mouth please call the clinic. If you need dental work, please let Dr. Whitney Muse know before you go for your appointment so that we can coordinate the best possible time for you in regards to your chemo regimen. You need to also let your dentist know that you are actively taking chemo. We may need to do labs prior to your dental appointment.   Skin Care Always use sunscreen that has not expired and with SPF (Sun Protection Factor) of 50 or higher. Wear hats to protect your head from the sun. Remember to use sunscreen on your hands, ears, face, & feet.  Use good moisturizing lotions such as udder cream, eucerin, or even Vaseline. Some chemotherapies can cause dry skin, color changes in your skin and nails.    . Avoid long, hot showers or baths. . Use gentle, fragrance-free soaps and laundry detergent. . Use moisturizers, preferably creams or ointments rather than lotions because the thicker consistency is better at preventing skin dehydration. Apply the cream or ointment within 15 minutes of showering. Reapply moisturizer at night, and moisturize your hands every time after you wash them.  Hair Loss (if your doctor says your hair will fall out)  . If your doctor says that your hair is likely to fall out, decide before you begin chemo whether you want to  wear a wig. You may want to shop before treatment to match your hair color. . Hats, turbans, and scarves can also camouflage hair loss, although some people prefer to leave their heads uncovered. If you go bare-headed outdoors, be sure to use sunscreen on your scalp. . Cut your hair short. It eases the inconvenience of shedding lots of hair, but it also can reduce the emotional impact of watching your hair fall out. . Don't perm or color your hair during chemotherapy. Those chemical treatments are already damaging to hair and can enhance hair loss. Once your chemo treatments are done and your hair has grown back, it's OK to resume dyeing or perming hair. With chemotherapy, hair loss is almost always temporary. But when it grows back, it may be a different color or texture. In older adults who still had hair color before chemotherapy, the new growth may be completely gray.  Often, new hair is very fine and soft.  Infection Prevention Please wash your hands for at least 30 seconds using warm soapy water. Handwashing is the #1 way to prevent the spread of germs. Stay away from sick people or people who are getting over a cold. If you develop respiratory systems such as green/yellow mucus production or productive cough or persistent cough let us know and we will see if you need an antibiotic. It is a good idea to keep a pair of gloves on when going into grocery stores/[employer] to decrease your risk of coming into contact with germs on the carts, etc. Carry alcohol hand gel with you at all times and use it frequently if  out in public. If your temperature reaches 100.5 or higher please call the clinic and let us know.  If it is after hours or on the weekend please go to the ER if your temperature is over 100.5.  Please have your own personal thermometer at home to use.    Sex and bodily fluids If you are going to have sex, a condom must be used to protect the person that isn't taking chemotherapy. Chemo can  decrease your libido (sex drive). For a few days after chemotherapy, chemotherapy can be excreted through your bodily fluids.  When using the toilet please close the lid and flush the toilet twice.  Do this for a few day after you have had chemotherapy.     Effects of chemotherapy on your sex life Some changes are simple and won't last long. They won't affect your sex life permanently. Sometimes you may feel: . too tired . not strong enough to be very active . sick or sore  . not in the mood . anxious or low Your anxiety might not seem related to sex. For example, you may be worried about the cancer and how your treatment is going. Or you may be worried about money, or about how you family are coping with your illness. These things can cause stress, which can affect your interest in sex. It's important to talk to your partner about how you feel. Remember - the changes to your sex life don't usually last long. There's usually no medical reason to stop having sex during chemo. The drugs won't have any long term physical effects on your performance or enjoyment of sex. Cancer can't be passed on to your partner during sex  Contraception It's important to use reliable contraception during treatment. Avoid getting pregnant while you or your partner are having chemotherapy. This is because the drugs may harm the baby. Sometimes chemotherapy drugs can leave a man or woman infertile.  This means you would not be able to have children in the future. You might want to talk to someone about permanent infertility. It can be very difficult to learn that you may no longer be able to have children. Some people find counselling helpful. There might be ways to preserve your fertility, although this is easier for men than for women. You may want to speak to a fertility expert. You can talk about sperm banking or harvesting your eggs. You can also ask about other fertility options, such as donor eggs. If you have or  have had breast cancer, your doctor might advise you not to take the contraceptive pill. This is because the hormones in it might affect the cancer.  It is not known for sure whether or not chemotherapy drugs can be passed on through semen or secretions from the vagina. Because of this some doctors advise people to use a barrier method if you have sex during treatment. This applies to vaginal, anal or oral sex. Generally, doctors advise a barrier method only for the time you are actually having the treatment and for about a week after your treatment. Advice like this can be worrying, but this does not mean that you have to avoid being intimate with your partner. You can still have close contact with your partner and continue to enjoy sex.  Animals If you have cats or birds we just ask that you not change the litter or change the cage.  Please have someone else do this for you while you are on chemotherapy.  Food Safety During and After Cancer Treatment Food safety is important for people both during and after cancer treatment. Cancer and cancer treatments, such as chemotherapy, radiation therapy, and stem cell/bone marrow transplantation, often weaken the immune system. This makes it harder for your body to protect itself from foodborne illness, also called food poisoning. Foodborne illness is caused by eating food that contains harmful bacteria, parasites, or viruses.  Foods to avoid Some foods have a higher risk of becoming tainted with bacteria. These include: [employer] Unwashed fresh fruit and vegetables, especially leafy vegetables that can hide dirt and other contaminants . Raw sprouts, such as alfalfa sprouts . Raw or undercooked beef, especially ground beef, or other raw or undercooked meat and poultry . Fatty, fried, or spicy foods immediately before or after treatment.  These can sit heavy on your stomach and make you feel nauseous. . Raw or undercooked shellfish, such as oysters. . Sushi and  sashimi, which often contain raw fish.  . Unpasteurized beverages, such as unpasteurized fruit juices, raw milk, raw yogurt, or cider . Undercooked eggs, such as soft boiled, over easy, and poached; raw, unpasteurized eggs; or foods made with raw egg, such as homemade raw cookie dough and homemade mayonnaise Simple steps for food safety Shop smart. . Do not buy food stored or displayed in an unclean area. . Do not buy bruised or damaged fruits or vegetables. . Do not buy cans that have cracks, dents, or bulges. . Pick up foods that can spoil at the end of your shopping trip and store them in a cooler on the way home. Prepare and clean up foods carefully. . Rinse all fresh fruits and vegetables under running water, and dry them with a clean towel or paper towel. . Clean the top of cans before opening them. . After preparing food, wash your hands for 20 seconds with hot water and soap. Pay special attention to areas between fingers and under nails. . Clean your utensils and dishes with hot water and soap. [employer] Disinfect your kitchen and cutting boards using 1 teaspoon of liquid, unscented bleach mixed into 1 quart of water.   Dispose of old food. . Eat canned and packaged food before its expiration date (the "use by" or "best before" date). . Consume refrigerated leftovers within 3 to 4 days. After that time, throw out the food. Even if the food does not smell or look spoiled, it still may be unsafe. Some bacteria, such as Listeria, can grow even on foods stored in the refrigerator if they are kept for too long. Take precautions when eating out. . At restaurants, avoid buffets and salad bars where food sits out for a long time and comes in contact with many people. Food can become contaminated when someone with a virus, often a norovirus, or another "bug" handles it. . Put any leftover food in a "to-go" container yourself, rather than having the server do it. And, refrigerate leftovers as soon as you  get home. . Choose restaurants that are clean and that are willing to prepare your food as you order it cooked.    MEDICATIONS:  Zofran/Ondansetron '8mg'$  tablet. Take 1 tablet every 8 hours as needed for nausea/vomiting. (#1 nausea med to take, this can constipate)  Compazine/Prochlorperazine '10mg'$  tablet. Take 1 tablet every 6 hours as needed for nausea/vomiting. (#2 nausea med to take, this can make you sleepy)   EMLA cream. Apply a quarter size amount to port site 1 hour prior to chemo. Do  not rub in. Cover with plastic wrap.   Over-the-Counter Meds:  Miralax 1 capful in 8 oz of fluid daily. May increase to two times a day if needed. This is a stool softener. If this doesn't work proceed you can add:  Senokot S-start with 1 tablet two times a day and increase to 4 tablets two times a day if needed. (total of 8 tablets in a 24 hour period). This is a stimulant laxative.   Call us if this does not help your bowels move.   Imodium '2mg'$  capsule. Take 2 capsules after the 1st loose stool and then 1 capsule every 2 hours until you go a total of 12 hours without having a loose stool. Call the [facility] if loose stools continue. If diarrhea occurs @ bedtime, take 2 capsules @ bedtime. Then take 2 capsules every 4 hours until morning. Call [facility].    Constipation Sheet *Miralax in 8 oz of fluid daily.  May increase to two times a day if needed.  This is a stool softener.  If this not enough to keep your bowel regular:  You can add:  *Senokot S, start with one tablet twice a day and can increase to 4 tablets twice a day if needed.  This is a stimulant laxative.   Sometimes when you take pain medication you need BOTH a medicine to keep your stool soft and a medicine to help your bowel push it out!  Please call if the above does not work for you.   Do not go more than 2 days without a bowel movement.  It is very important that you do not become constipated.  It will make you feel  sick to your stomach (nausea) and can cause abdominal pain and vomiting.    Diarrhea Sheet  If you are having loose stools/diarrhea, please purchase Imodium and begin taking as outlined:  At the first sign of poorly formed or loose stools you should begin taking Imodium(loperamide) 2 mg capsules.  Take two caplets ('4mg'$ ) followed by one caplet ('2mg'$ ) every 2 hours until you have had no diarrhea for 12 hours.  During the night take two caplets ('4mg'$ ) at bedtime and continue every 4 hours during the night until the morning.  Stop taking Imodium only after there is no sign of diarrhea for 12 hours.    Always call the [facility] if you are having loose stools/diarrhea that you can't get under control.  Loose stools/disrrhea leads to dehydration (loss of water) in your body.  We have other options of trying to get the loose stools/diarrhea to stopped but you must let us know!     Nausea Sheet  Zofran/Ondansetron '8mg'$  tablet. Take 1 tablet every 8 hours as needed for nausea/vomiting. (#1 nausea med to take, this can constipate)  Compazine/Prochlorperazine '10mg'$  tablet. Take 1 tablet every 6 hours as needed for nausea/vomiting. (#2 nausea med to take, this can make you sleepy)  You can take these medications together or separately.  We would first like for you to try the Ondansetron by itself and then take the Prochloperizine if needed. But you are allowed to take both medications at the same time if your nausea is that severe.  If you are having persistent nausea (nausea that does not stop) please take these medications on a staggered schedule so that the nausea medication stays in your body.  Please call the [facility] and let us know the amount of nausea that you are experiencing.  If you begin to vomit, you need to call the [facility] and if it is the weekend and you have vomited more than one time and cant get it to stop-go to the Emergency Room.  Persistent nausea/vomiting can lead to dehydration  (loss of fluid in your body) and will make you feel terrible.   Ice chips, sips of clear liquids, foods that are @ room temperature, crackers, and toast tend to be better tolerated.    SYMPTOMS TO REPORT AS SOON AS POSSIBLE AFTER TREATMENT:  FEVER GREATER THAN 100.5 F  CHILLS WITH OR WITHOUT FEVER  NAUSEA AND VOMITING THAT IS NOT CONTROLLED WITH YOUR NAUSEA MEDICATION  UNUSUAL SHORTNESS OF BREATH  UNUSUAL BRUISING OR BLEEDING  TENDERNESS IN MOUTH AND THROAT WITH OR WITHOUT PRESENCE OF ULCERS  URINARY PROBLEMS  BOWEL PROBLEMS  UNUSUAL RASH    Wear comfortable clothing and clothing appropriate for easy access to any Portacath or PICC line. Let us know if there is anything that we can do to make your therapy better!    What to do if you need assistance after hours or on the weekends: CALL [phone].  HOLD on the line, do not hang up.  You will hear multiple messages but at the end you will be connected with a nurse triage line.  They will contact Dr Whitney Muse if necessary.  Most of the time they will be able to assist you.   Do not call the hospital operator.  Dr Whitney Muse will not answer phone calls received by them.     I have been informed and understand all of the instructions given to me and have received a copy. I have been instructed to call the clinic [phone] or my family physician as soon as possible for continued medical care, if indicated. I do not have any more questions at this time but understand that I may call the [facility] or the Patient Navigator at [phone] during office hours should I have questions or need assistance in obtaining follow-up care.

## 2016-07-28 ENCOUNTER — Encounter (HOSPITAL_COMMUNITY): Payer: Medicare HMO | Attending: Hematology

## 2016-07-28 DIAGNOSIS — Z95828 Presence of other vascular implants and grafts: Secondary | ICD-10-CM | POA: Insufficient documentation

## 2016-07-28 DIAGNOSIS — C569 Malignant neoplasm of unspecified ovary: Secondary | ICD-10-CM | POA: Insufficient documentation

## 2016-07-28 DIAGNOSIS — C541 Malignant neoplasm of endometrium: Secondary | ICD-10-CM

## 2016-07-29 ENCOUNTER — Other Ambulatory Visit (HOSPITAL_COMMUNITY): Payer: Self-pay | Admitting: Pharmacist

## 2016-07-30 DIAGNOSIS — N183 Chronic kidney disease, stage 3 (moderate): Secondary | ICD-10-CM | POA: Diagnosis not present

## 2016-07-30 DIAGNOSIS — C569 Malignant neoplasm of unspecified ovary: Secondary | ICD-10-CM | POA: Diagnosis not present

## 2016-07-30 DIAGNOSIS — C541 Malignant neoplasm of endometrium: Secondary | ICD-10-CM | POA: Diagnosis not present

## 2016-07-30 DIAGNOSIS — E782 Mixed hyperlipidemia: Secondary | ICD-10-CM | POA: Diagnosis not present

## 2016-07-30 DIAGNOSIS — Z6839 Body mass index (BMI) 39.0-39.9, adult: Secondary | ICD-10-CM | POA: Diagnosis not present

## 2016-07-30 DIAGNOSIS — E1143 Type 2 diabetes mellitus with diabetic autonomic (poly)neuropathy: Secondary | ICD-10-CM | POA: Diagnosis not present

## 2016-07-30 DIAGNOSIS — E1165 Type 2 diabetes mellitus with hyperglycemia: Secondary | ICD-10-CM | POA: Diagnosis not present

## 2016-07-30 DIAGNOSIS — E1122 Type 2 diabetes mellitus with diabetic chronic kidney disease: Secondary | ICD-10-CM | POA: Diagnosis not present

## 2016-08-02 ENCOUNTER — Other Ambulatory Visit (HOSPITAL_COMMUNITY): Payer: Self-pay | Admitting: Hematology & Oncology

## 2016-08-02 NOTE — Progress Notes (Signed)
START OFF PATHWAY REGIMEN - Ovarian  Off Pathway: Carboplatin AUC=5 D1 + Gemcitabine 800 mg/m2 D1, 8 q28 Days  [id number]:Carboplatin AUC=5 D1 + Gemcitabine 800 mg/m2 D1, 8 q28 Days:   A cycle is every 28 days:     Gemcitabine (Gemzar(R)) 800 mg/m2 in 250 mL NS IV over 30 minutes days 1 and 8 Dose Mod: None     Carboplatin (Paraplatin(R)) AUC=5 in 250 mL NS IV over 1 hour day 1 only Dose Mod: None Additional Orders: * All AUC calculations intended to be used in [employer] formula  **Always confirm dose/schedule in your pharmacy ordering system**    Patient Characteristics: Second Line Therapy, > 12 Months AJCC T Category: TX AJCC N Category: NX AJCC M Category: Staged < 8th Ed. AJCC 8 Stage Grouping: IIIC Line of Therapy: Second Line Therapy BRCA Mutation Status: Did Not Order Test Would you be surprised if this patient died  in the next year? I would be surprised if this patient died in the next year Time since last treatment: > 12 Months  Intent of Therapy: Non-Curative / Palliative Intent, Discussed with Patient

## 2016-08-03 ENCOUNTER — Encounter (HOSPITAL_BASED_OUTPATIENT_CLINIC_OR_DEPARTMENT_OTHER): Payer: Medicare HMO

## 2016-08-03 VITALS — BP 158/59 | HR 72 | Temp 98.1°F | Resp 18 | Wt 259.0 lb

## 2016-08-03 DIAGNOSIS — Z5111 Encounter for antineoplastic chemotherapy: Secondary | ICD-10-CM

## 2016-08-03 DIAGNOSIS — Z95828 Presence of other vascular implants and grafts: Secondary | ICD-10-CM | POA: Diagnosis not present

## 2016-08-03 DIAGNOSIS — C569 Malignant neoplasm of unspecified ovary: Secondary | ICD-10-CM | POA: Diagnosis not present

## 2016-08-03 DIAGNOSIS — C541 Malignant neoplasm of endometrium: Secondary | ICD-10-CM

## 2016-08-03 LAB — COMPREHENSIVE METABOLIC PANEL
ALBUMIN: 3.1 g/dL — AB (ref 3.5–5.0)
ALK PHOS: 55 U/L (ref 38–126)
ALT: 14 U/L (ref 14–54)
ANION GAP: 9 (ref 5–15)
AST: 22 U/L (ref 15–41)
BUN: 21 mg/dL — ABNORMAL HIGH (ref 6–20)
CALCIUM: 9.4 mg/dL (ref 8.9–10.3)
CO2: 26 mmol/L (ref 22–32)
Chloride: 100 mmol/L — ABNORMAL LOW (ref 101–111)
Creatinine, Ser: 1.28 mg/dL — ABNORMAL HIGH (ref 0.44–1.00)
GFR calc non Af Amer: 43 mL/min — ABNORMAL LOW (ref >60)
GFR, EST AFRICAN AMERICAN: 49 mL/min — AB (ref >60)
GLUCOSE: 207 mg/dL — AB (ref 65–99)
POTASSIUM: 4.4 mmol/L (ref 3.5–5.1)
SODIUM: 135 mmol/L (ref 135–145)
Total Bilirubin: 0.4 mg/dL (ref 0.3–1.2)
Total Protein: 6.6 g/dL (ref 6.5–8.1)

## 2016-08-03 LAB — CBC WITH DIFFERENTIAL/PLATELET
BASOS PCT: 0 %
Basophils Absolute: 0 10*3/uL (ref 0.0–0.1)
EOS ABS: 0.1 10*3/uL (ref 0.0–0.7)
EOS PCT: 1 %
HCT: 34.9 % — ABNORMAL LOW (ref 36.0–46.0)
Hemoglobin: 12 g/dL (ref 12.0–15.0)
LYMPHS ABS: 1.5 10*3/uL (ref 0.7–4.0)
Lymphocytes Relative: 24 %
MCH: 32 pg (ref 26.0–34.0)
MCHC: 34.4 g/dL (ref 30.0–36.0)
MCV: 93.1 fL (ref 78.0–100.0)
MONO ABS: 0.4 10*3/uL (ref 0.1–1.0)
MONOS PCT: 6 %
NEUTROS PCT: 69 %
Neutro Abs: 4.2 10*3/uL (ref 1.7–7.7)
PLATELETS: 150 10*3/uL (ref 150–400)
RBC: 3.75 MIL/uL — ABNORMAL LOW (ref 3.87–5.11)
RDW: 13.2 % (ref 11.5–15.5)
WBC: 6.2 10*3/uL (ref 4.0–10.5)

## 2016-08-03 MED ORDER — SODIUM CHLORIDE 0.9 % IV SOLN: 10.0000 mg | Freq: Once | INTRAVENOUS | Status: DC

## 2016-08-03 MED ORDER — DEXAMETHASONE SODIUM PHOSPHATE 10 MG/ML IJ SOLN
10.0000 mg | Freq: Once | INTRAMUSCULAR | Status: AC
  Administered 2016-08-03: 10 mg via INTRAVENOUS

## 2016-08-03 MED ORDER — DEXAMETHASONE SODIUM PHOSPHATE 10 MG/ML IJ SOLN
INTRAMUSCULAR | Status: AC
  Filled 2016-08-03: qty 1

## 2016-08-03 MED ORDER — SODIUM CHLORIDE 0.9 % IV SOLN
513.5000 mg | Freq: Once | INTRAVENOUS | Status: AC
  Administered 2016-08-03: 510 mg via INTRAVENOUS
  Filled 2016-08-03: qty 51

## 2016-08-03 MED ORDER — PALONOSETRON HCL INJECTION 0.25 MG/5ML
INTRAVENOUS | Status: AC
  Filled 2016-08-03: qty 5

## 2016-08-03 MED ORDER — PALONOSETRON HCL INJECTION 0.25 MG/5ML
0.2500 mg | Freq: Once | INTRAVENOUS | Status: AC
  Administered 2016-08-03: 0.25 mg via INTRAVENOUS

## 2016-08-03 MED ORDER — HEPARIN SOD (PORK) LOCK FLUSH 100 UNIT/ML IV SOLN
500.0000 [IU] | Freq: Once | INTRAVENOUS | Status: AC | PRN
  Administered 2016-08-03: 500 [IU]
  Filled 2016-08-03: qty 5

## 2016-08-03 MED ORDER — SODIUM CHLORIDE 0.9 % IV SOLN
750.0000 mg/m2 | Freq: Once | INTRAVENOUS | Status: AC
  Administered 2016-08-03: 1748 mg via INTRAVENOUS
  Filled 2016-08-03: qty 24.96

## 2016-08-03 MED ORDER — SODIUM CHLORIDE 0.9 % IV SOLN
Freq: Once | INTRAVENOUS | Status: AC
  Administered 2016-08-03: 12:00:00 via INTRAVENOUS

## 2016-08-03 NOTE — Patient Instructions (Signed)
[facility] Discharge Instructions for Patients Receiving Chemotherapy   Beginning January 23rd 2017 lab work for the [facility] will be done in the  Main lab at [facility] on 1st floor. If you have a lab appointment with the [facility] please come in thru the  Main Entrance and check in at the main information desk   Today you received the following chemotherapy agents carbo and gemzar.  To help prevent nausea and vomiting after your treatment, we encourage you to take your nausea medication  As instructed.   If you develop nausea and vomiting, or diarrhea that is not controlled by your medication, call the clinic.  The clinic phone number is [phone]. Office hours are Monday-Friday 8:30am-5:00pm.  BELOW ARE SYMPTOMS THAT SHOULD BE REPORTED IMMEDIATELY:  *FEVER GREATER THAN 101.0 F  *CHILLS WITH OR WITHOUT FEVER  NAUSEA AND VOMITING THAT IS NOT CONTROLLED WITH YOUR NAUSEA MEDICATION  *UNUSUAL SHORTNESS OF BREATH  *UNUSUAL BRUISING OR BLEEDING  TENDERNESS IN MOUTH AND THROAT WITH OR WITHOUT PRESENCE OF ULCERS  *URINARY PROBLEMS  *BOWEL PROBLEMS  UNUSUAL RASH Items with * indicate a potential emergency and should be followed up as soon as possible. If you have an emergency after office hours please contact your primary care physician or go to the nearest emergency department.  Please call the clinic during office hours if you have any questions or concerns.   You may also contact the Patient Navigator at [phone] should you have any questions or need assistance in obtaining follow up care.      Resources For Cancer Patients and their Caregivers ? American [employer] Society: Can assist with transportation, wigs, general needs, runs Look Good Feel Better.        954-144-1721 ? Cancer Care: Provides financial assistance, online support groups, medication/co-pay assistance.  1-800-813-HOPE 216-562-0319) ? [facility] Assists [place] cancer patients and their families through emotional , educational and financial support.  802-778-0093 ? [place] DSS Where to apply for food stamps, Medicaid and utility assistance. (815)584-0996 ? [facility]: Transportation to medical appointments. 825-077-4802 ? Social Security Administration: May apply for disability if have a Stage IV cancer. 602-792-6890 585 549 1047 ? [employer], Disability and Transit Services: Assists with nutrition, care and transit needs. [phone]

## 2016-08-03 NOTE — Progress Notes (Signed)
Labs reviewed with Dr.Zhou, patient is ok for chemo treatment today.  Tolerated chemo well. Stable on discharge home via wheelchair with husband.

## 2016-08-04 ENCOUNTER — Telehealth (HOSPITAL_COMMUNITY): Payer: Self-pay | Admitting: *Deleted

## 2016-08-04 ENCOUNTER — Other Ambulatory Visit (HOSPITAL_COMMUNITY): Payer: Self-pay

## 2016-08-04 DIAGNOSIS — C569 Malignant neoplasm of unspecified ovary: Secondary | ICD-10-CM

## 2016-08-04 DIAGNOSIS — Z95828 Presence of other vascular implants and grafts: Secondary | ICD-10-CM

## 2016-08-04 LAB — CA 125: CA 125: 73.7 U/mL — ABNORMAL HIGH (ref 0.0–38.1)

## 2016-08-04 MED ORDER — OXYCODONE-ACETAMINOPHEN 10-325 MG PO TABS
1.0000 | ORAL_TABLET | ORAL | 0 refills | Status: DC | PRN
Start: 2016-08-04 — End: 2016-08-31

## 2016-08-04 NOTE — Telephone Encounter (Signed)
Message left on answering machine. Informed patient was calling to follow-up after chemo infusion yesterday. Instruct to call clinic with any issues or concerns.

## 2016-08-10 ENCOUNTER — Encounter: Payer: Self-pay | Admitting: Hematology

## 2016-08-10 ENCOUNTER — Encounter (HOSPITAL_BASED_OUTPATIENT_CLINIC_OR_DEPARTMENT_OTHER): Payer: Medicare HMO | Admitting: Adult Health

## 2016-08-10 ENCOUNTER — Encounter (HOSPITAL_COMMUNITY): Payer: Medicare HMO | Attending: Hematology

## 2016-08-10 ENCOUNTER — Other Ambulatory Visit (HOSPITAL_COMMUNITY): Payer: Self-pay | Admitting: Oncology

## 2016-08-10 ENCOUNTER — Ambulatory Visit (HOSPITAL_COMMUNITY): Payer: Medicare HMO | Admitting: Adult Health

## 2016-08-10 ENCOUNTER — Encounter (HOSPITAL_COMMUNITY): Payer: Self-pay | Admitting: Adult Health

## 2016-08-10 VITALS — BP 146/56 | HR 88 | Temp 98.1°F | Resp 18 | Ht 69.0 in | Wt 254.8 lb

## 2016-08-10 DIAGNOSIS — G629 Polyneuropathy, unspecified: Secondary | ICD-10-CM | POA: Diagnosis not present

## 2016-08-10 DIAGNOSIS — C541 Malignant neoplasm of endometrium: Secondary | ICD-10-CM | POA: Diagnosis not present

## 2016-08-10 DIAGNOSIS — R197 Diarrhea, unspecified: Secondary | ICD-10-CM

## 2016-08-10 DIAGNOSIS — C569 Malignant neoplasm of unspecified ovary: Secondary | ICD-10-CM

## 2016-08-10 DIAGNOSIS — D61810 Antineoplastic chemotherapy induced pancytopenia: Secondary | ICD-10-CM

## 2016-08-10 DIAGNOSIS — R110 Nausea: Secondary | ICD-10-CM

## 2016-08-10 DIAGNOSIS — Z95828 Presence of other vascular implants and grafts: Secondary | ICD-10-CM | POA: Diagnosis present

## 2016-08-10 DIAGNOSIS — D696 Thrombocytopenia, unspecified: Secondary | ICD-10-CM | POA: Insufficient documentation

## 2016-08-10 DIAGNOSIS — R740 Nonspecific elevation of levels of transaminase and lactic acid dehydrogenase [LDH]: Secondary | ICD-10-CM

## 2016-08-10 LAB — COMPREHENSIVE METABOLIC PANEL
ALBUMIN: 2.9 g/dL — AB (ref 3.5–5.0)
ALT: 54 U/L (ref 14–54)
AST: 69 U/L — AB (ref 15–41)
Alkaline Phosphatase: 69 U/L (ref 38–126)
Anion gap: 9 (ref 5–15)
BUN: 16 mg/dL (ref 6–20)
CHLORIDE: 99 mmol/L — AB (ref 101–111)
CO2: 23 mmol/L (ref 22–32)
Calcium: 8.4 mg/dL — ABNORMAL LOW (ref 8.9–10.3)
Creatinine, Ser: 1.06 mg/dL — ABNORMAL HIGH (ref 0.44–1.00)
GFR calc Af Amer: >60 mL/min (ref >60)
GFR calc non Af Amer: 53 mL/min — ABNORMAL LOW (ref >60)
GLUCOSE: 229 mg/dL — AB (ref 65–99)
POTASSIUM: 4.4 mmol/L (ref 3.5–5.1)
SODIUM: 131 mmol/L — AB (ref 135–145)
Total Bilirubin: 0.6 mg/dL (ref 0.3–1.2)
Total Protein: 6.3 g/dL — ABNORMAL LOW (ref 6.5–8.1)

## 2016-08-10 LAB — CBC WITH DIFFERENTIAL/PLATELET
BASOS ABS: 0 10*3/uL (ref 0.0–0.1)
BASOS PCT: 0 %
EOS ABS: 0 10*3/uL (ref 0.0–0.7)
EOS PCT: 1 %
HEMATOCRIT: 31.9 % — AB (ref 36.0–46.0)
Hemoglobin: 10.8 g/dL — ABNORMAL LOW (ref 12.0–15.0)
Lymphocytes Relative: 32 %
Lymphs Abs: 0.7 10*3/uL (ref 0.7–4.0)
MCH: 31.4 pg (ref 26.0–34.0)
MCHC: 33.9 g/dL (ref 30.0–36.0)
MCV: 92.7 fL (ref 78.0–100.0)
MONO ABS: 0 10*3/uL — AB (ref 0.1–1.0)
Monocytes Relative: 1 %
NEUTROS ABS: 1.5 10*3/uL — AB (ref 1.7–7.7)
Neutrophils Relative %: 67 %
PLATELETS: 37 10*3/uL — AB (ref 150–400)
RBC: 3.44 MIL/uL — ABNORMAL LOW (ref 3.87–5.11)
RDW: 12.7 % (ref 11.5–15.5)
WBC: 2.2 10*3/uL — ABNORMAL LOW (ref 4.0–10.5)

## 2016-08-10 MED ORDER — SODIUM CHLORIDE 0.9% FLUSH
20.0000 mL | INTRAVENOUS | Status: DC | PRN
  Administered 2016-08-10: 20 mL via INTRAVENOUS
  Filled 2016-08-10: qty 20

## 2016-08-10 MED ORDER — HEPARIN SOD (PORK) LOCK FLUSH 100 UNIT/ML IV SOLN
500.0000 [IU] | Freq: Once | INTRAVENOUS | Status: AC
  Administered 2016-08-10: 500 [IU] via INTRAVENOUS

## 2016-08-10 NOTE — Progress Notes (Signed)
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 [name] and associates, pathology could not be certain that these were not 2 separate primaries rather than one metastatic process from the uterus to the ovary.      08/15/2013 - 12/06/2013 Chemotherapy    Carboplatin/Taxol with dose reduction of 50% and Taxol due to grade 2+ peripheral neuropathy and gabapentin induced nightmare/hallucinations. 5 out of 6 cycles given with the final cycle being cancelled secondary to significant thrombocytopenia/intolerance.      09/10/2013 - 10/17/2013 Radiation Therapy    5040 cGy delivered by EBRT followed by intravaginal brachytherapy.      04/30/2014 PET scan    PET scan consistent with recurrent disease of either ovarian cancer or endometrial cancer.      06/03/2014 - 07/15/2014 Radiation Therapy    5040 cGy over 28 fractions delivered to the low periaortic area lymph node either metastatic endometrial or metastatic or ovarian cancer.      07/19/2014 Imaging    Imaging concerning for new mesenteric disease and subdiaphragmatic disease.      08/05/2014 PET scan    No evidence of progression of disease.      08/05/2014 Remission    Negative PET scan.      06/11/2016 PET scan    1. Hypermetabolic aortocaval and right common iliac adenopathy, maximum SUV 17.1, compatible with malignancy. 2. Calcified left mesenteric mass has only low-grade activity, maximum SUV 5.3, merit surveillance. 3. There several small calcifications along the omentum which are not  currently metabolically active. 4. Along the scarring in operative site from prior laparotomy there some faintly accentuated metabolic activity which is probably related to the original wound and unlikely to be from tumor. 5. Coronary, aortic arch, and branch vessel atherosclerotic vascular disease.      06/11/2016 Relapse/Recurrence    PET scan with aortocaval & right iliac adenopathy consistent with malignancy.       08/03/2016 -  Chemotherapy    Carboplatin Day 1/Gemzar Days 1&8 every 21 days          HISTORY OF PRESENT ILLNESS:  As per Dr. Donald Pore note on 06/15/2016: "Kathleen Whitehead 66 y.o. female returns for followup of Ovarian Cancer/Endometrial cancer, S/P TAH-BSO by Dr. [name] with unsure pathology regarding one primary with metastatic disease versus two separate primaries followed by Carboplatin/Taxol x 5/6 cycles (last cycle cancelled due to intolerance and thrombocytopenia), followed by EBRT and intravaginal brachytherapy.  Kathleen Whitehead did have a recurrence in October 2015 on PET imaging requiring another treatment with EBRT.  Finishing all therapy on 07/15/2014.  Pre-operative CA 125 was elevated."    INTERVAL HISTORY:  Kathleen Whitehead presents today accompanied by her husband for consideration of Cycle #1, Day 8 of Carboplatin D1/ Gemcitabine D1,8 every 21 days. Kathleen Whitehead is seen in a wheelchair.   Her fatigue is constant.  Kathleen Whitehead has bad neuropathy in hands, feet, and legs. Kathleen Whitehead was started on Lyrica, but only took the medication about 1 week because Kathleen Whitehead reports significant LE edema.  Kathleen Whitehead stopped  using it before starting her chemotherapy. Kathleen Whitehead can't take Cymbalta. Kathleen Whitehead says Kathleen Whitehead has tried everything for it including gabapentin, but nothing has helped.  Kathleen Whitehead notes taking Percocet helps "calm it down a little bit."  Kathleen Whitehead takes the Percocet around-the-clock every 4 hours; sometimes Kathleen Whitehead requires 2 pills instead of one, "but not all the time."  Kathleen Whitehead is able to feed herself, but needs help getting  dressed, showering, and other things around the house. Kathleen Whitehead uses a walker at home to ambulate.  Kathleen Whitehead has occasional right hip pain, which Kathleen Whitehead attributes to sciatica that started after her initial radiation treatments.    Kathleen Whitehead is s/p cycle #1, day 1 last week of Carbo/Gemzar. Kathleen Whitehead states Kathleen Whitehead felt good for the few few days but felt sick after that, with nausea, diarrhea, "flu-like symptoms" with body aches, headache, hot flashes and chills. Denies fever; Kathleen Whitehead checked her temperature frequently.  Kathleen Whitehead takes Zofran as needed for the nausea, which is effective.  Kathleen Whitehead had 2 days of diarrhea, which resolved with Imodium.  Her bowel movements have returned to normal.    Endorses some bloody nasal drainage first thing in the morning, but denies frank epistaxis.  Chronic left hand swelling after a fracture in 2013; largely unchanged.    Denies cough, SOB, mouth sores, bleeding from gums, falls, melena, blood in stool, dysuria, hematuria, and vaginal bleeding,.     REVIEW OF SYSTEMS:  Review of Systems  Constitutional: Positive for fatigue. Negative for fever.       "Hot one minute, and chilly next minute."  HENT:  Negative.  Negative for mouth sores.        Denies bleeding from gums Has blood tinged drainage sometimes.  Eyes: Negative.   Respiratory: Negative.  Negative for cough and shortness of breath.   Cardiovascular: Negative.   Gastrointestinal: Positive for abdominal pain (sometimes), diarrhea (alleviated with immodium) and nausea (alleviated with zofran). Negative for blood in stool.       Denies melena   Endocrine: Negative.   Genitourinary: Negative.  Negative for dysuria, hematuria and vaginal bleeding.   Musculoskeletal: Positive for arthralgias.       Denies falls (R) hip pain associated with her sciatic nerve that started when Kathleen Whitehead received radiation. Chronic left hand swelling after breaking it in 2013  Skin: Negative.   Neurological: Positive for headaches and numbness ( neuropathy in  hands, legs and feet. ).  Hematological: Negative.  Does not bruise/bleed easily.  Psychiatric/Behavioral: Negative.   All other systems reviewed and are negative.    PAST MEDICAL/SURGICAL HISTORY:  Past Medical History:  Diagnosis Date  . Anemia   . Chemotherapy induced neutropenia ([place])   . Chemotherapy induced thrombocytopenia   . Diabetes mellitus without complication ([place])   . Endometrial cancer ([place])   . Hot flashes   . Hypertension   . Malignant neoplasm ([place])   . 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 Obesity   . Ovarian cancer ([place])   . Pancytopenia ([place])   . Peripheral artery disease ([place])   . Peripheral neuropathy ([place])   . Port-a-cath in place    History reviewed. No pertinent surgical history.   SOCIAL HISTORY:  Social History   Social History  . Marital status: Married    Spouse name: N/A  . Number of children: N/A  . Years of education: N/A   Occupational History  . Not on file.   Social History Main Topics  . Smoking status: Never Smoker  .  Smokeless tobacco: Never Used  . Alcohol use No  . Drug use: No  . Sexual activity: Not on file   Other Topics Concern  . Not on file   Social History Narrative  . No narrative on file    FAMILY HISTORY:  Family History  Problem Relation Age of Onset  . Heart failure Mother   . Stroke Sister   . Cancer Sister   . Diabetes Sister   . Leukemia Sister   . Diabetes Brother     CURRENT MEDICATIONS:  Outpatient Encounter Prescriptions as of 08/10/2016  Medication Sig Note  . acetaminophen (TYLENOL) 500 MG tablet Take by mouth. 01/15/2016: Received from: [facility]  . aspirin EC 325 MG tablet  01/15/2016: Received [facility]: [facility]  . CARBOPLATIN IV Inject into the vein.   . cetirizine (ZYRTEC) 10 MG tablet Take by mouth. 01/15/2016: Received from: [facility]  . Gemcitabine HCl (GEMZAR IV) Inject into the vein.   Marland Kitchen glipiZIDE (GLUCOTROL) 10 MG tablet TAKE 1 TABLET BY MOUTH TWICE A DAY  FOR DIABETES 03/02/2016: Received from: External Pharm[facility]  . lidocaine-prilocaine (EMLA) cream Apply a quarter size amount to affected area 1 hour prior to coming to chemotherapy.   Marland Kitchen lisinopril (PRINIVIL,ZESTRIL) 40 MG tablet Take by mouth. 01/15/2016: Received from: [facility]  . loperamide (IMODIUM) 2 MG capsule Take by mouth. 01/15/2016: Received from: [facility]  . LYRICA 75 MG capsule Take 75 mg by mouth daily.   . metFORMIN (GLUCOPHAGE) 1000 MG tablet Take by mouth. 01/15/2016: Received from: [facility]  . Omega-3 Fatty Acids (FISH OIL) 1000 MG CAPS Take by mouth. 01/15/2016: Received from: [facility]  . ondansetron (ZOFRAN) 8 MG tablet Take 1 tablet (8 mg total) by mouth every 8 (eight) hours as needed for nausea or vomiting.   . ondansetron (ZOFRAN-ODT) 4 MG disintegrating tablet Take by mouth. 01/15/2016: Received from: [facility]  . oxyCODONE-acetaminophen (PERCOCET) 10-325 MG tablet Take 1 tablet by mouth every 4 (four) hours as needed for pain.   . polyethylene glycol powder (MIRALAX) powder Take by mouth. 01/15/2016: Received from: [facility]  . prochlorperazine (COMPAZINE) 10 MG tablet Take 1 tablet (10 mg total) by mouth every 6 (six) hours as needed for nausea or vomiting.    No facility-administered encounter medications on file as of 08/10/2016.      ALLERGIES:  Allergies  Allergen Reactions  . Diphenhydramine Palpitations  . Iodinated Diagnostic Agents Other (See Comments)  . Duloxetine Hcl Nausea And Vomiting  . Ibuprofen Other (See Comments)    Makes my bones hurt  . Penicillins Rash     Vitals:   08/10/16 1128  BP: (!) 146/56  Pulse: 88  Resp: 18  Temp: 98.1 F (36.7 C)    PHYSICAL EXAM:  ECOG Performance status: 2-3 - Symptomatic; requires assistance with    Physical Exam  Constitutional: Kathleen Whitehead is oriented to person, place, and time and well-developed, well-nourished, and in no distress.  Physical exam was performed in a wheelchair.  HENT:    Whitehead: Normocephalic and atraumatic.  Mouth/Throat: Oropharynx is clear and moist. No oropharyngeal exudate.  Eyes: Conjunctivae are normal. Pupils are equal, round, and reactive to light. No scleral icterus.  Neck: Normal range of motion. Neck supple.  Cardiovascular: Normal rate, regular rhythm and normal heart sounds.   Pulmonary/Chest: Effort normal and breath sounds normal. No respiratory distress.  Abdominal: Soft. Bowel sounds are normal. There is no tenderness.  Musculoskeletal: Normal range of motion.  Kathleen Whitehead exhibits edema (left hand edema).  1+ pitting edema in bilateral legs/ankles/feet   Lymphadenopathy:    Kathleen Whitehead has no cervical adenopathy.  Neurological: Kathleen Whitehead is alert and oriented to person, place, and time.  Skin: Skin is warm and dry. There is pallor.  Psychiatric: Mood, memory, affect and judgment normal.  Nursing note and vitals reviewed.    LABORATORY DATA:  I have reviewed the labs as listed.  CBC    Component Value Date/Time   WBC 2.2 (L) 08/10/2016 1130   RBC 3.44 (L) 08/10/2016 1130   HGB 10.8 (L) 08/10/2016 1130   HCT 31.9 (L) 08/10/2016 1130   PLT 37 (L) 08/10/2016 1130   MCV 92.7 08/10/2016 1130   MCH 31.4 08/10/2016 1130   MCHC 33.9 08/10/2016 1130   RDW 12.7 08/10/2016 1130   LYMPHSABS 0.7 08/10/2016 1130   MONOABS 0.0 (L) 08/10/2016 1130   EOSABS 0.0 08/10/2016 1130   BASOSABS 0.0 08/10/2016 1130   CMP Latest Ref Rng & Units 08/10/2016 08/03/2016 05/21/2016  Glucose 65 - 99 mg/dL 229(H) 207(H) 191(H)  BUN 6 - 20 mg/dL 16 21(H) 20  Creatinine 0.44 - 1.00 mg/dL 1.06(H) 1.28(H) 1.19(H)  Sodium 135 - 145 mmol/L 131(L) 135 137  Potassium 3.5 - 5.1 mmol/L 4.4 4.4 4.7  Chloride 101 - 111 mmol/L 99(L) 100(L) 102  CO2 22 - 32 mmol/L 23 26 27   Calcium 8.9 - 10.3 mg/dL 8.4(L) 9.4 10.0  Total Protein 6.5 - 8.1 g/dL 6.3(L) 6.6 6.5  Total Bilirubin 0.3 - 1.2 mg/dL 0.6 0.4 0.3  Alkaline Phos 38 - 126 U/L 69 55 57  AST 15 - 41 U/L 69(H) 22 32  ALT 14 - 54 U/L 54  14 20    PENDING LABS:    DIAGNOSTIC IMAGING:  I have personally reviewed the radiologic reports and agree with below results.   PET scan: 06/11/16 CLINICAL DATA:  Subsequent Treatment strategy for uterine and ovarian cancer with increasing tumor markers appear.  EXAM: NUCLEAR MEDICINE PET SKULL BASE TO THIGH  TECHNIQUE: 12.3 mCi F-18 FDG was injected intravenously. Full-ring PET imaging was performed from the skull base to thigh after the radiotracer. CT data was obtained and used for attenuation correction and anatomic localization.  FASTING BLOOD GLUCOSE:  Value: 86 mg/dl  COMPARISON:  Multiple exams, including CT abdomen and pelvis dated 07/19/2014  FINDINGS: NECK  No hypermetabolic lymph nodes in the neck.  CHEST  No hypermetabolic mediastinal or hilar nodes. No suspicious pulmonary nodules on the CT data. Coronary, aortic arch, and branch vessel atherosclerotic vascular disease.  ABDOMEN/PELVIS  No abnormal hypermetabolic activity within the liver, pancreas, adrenal glands, or spleen. Clustered right periaortic lymph nodes below the level of the renal veins, the primarily posterior to the IVC, measuring up to about 1.3 cm in short axis on image 130/4, maximum SUV 17.1. Abnormal hypermetabolic nodes extend into the right common iliac chain, index right iliac node 1.1 cm in short axis on image 143/4. These nodes are increased compared to 07/19/14.  Calcified left mesenteric mass measuring 3.3 by 2.2 cm, metabolic activity is similar to the surrounding small bowel, maximum SUV approximately 5.3  Along the surgical site in the right rectus abdominus there is some very faintly increased activity, maximum SUV 4.0, probably simply postoperative.  Faint curvilinear calcification in the left upper quadrant near the splenic flexure on image 122/4 is not hypermetabolic and a least 6 mm in diameter.  There some small calcifications along the  omentum.  Chronic presacral edema.  Uterus and ovaries absent.  SKELETON  No focal hypermetabolic activity to suggest skeletal metastasis.  IMPRESSION: 1. Hypermetabolic aortocaval and right common iliac adenopathy, maximum SUV 17.1, compatible with malignancy. 2. Calcified left mesenteric mass has only low-grade activity, maximum SUV 5.3, merit surveillance. 3. There several small calcifications along the omentum which are not currently metabolically active. 4. Along the scarring in operative site from prior laparotomy there some faintly accentuated metabolic activity which is probably related to the original wound and unlikely to be from tumor. 5. Coronary, aortic arch, and branch vessel atherosclerotic vascular disease.   Electronically Signed   By: Van Clines M.D.   On: 06/11/2016 13:05      PATHOLOGY:     ASSESSMENT & PLAN:  Kathleen Whitehead is a pleasant 66 y.o. female with recurrent ovarian/uterine cancer (either metastatic disease or 2 primary malignancies); Initially diagnosed in 04/2013, when Kathleen Whitehead underwent TAH/BSO. Kathleen Whitehead went on to receive adjuvant chemotherapy with Carbo/Taxol x 5 cycles requiring 50% dose reduction d/t peripheral neuropathy; 6th cycle held d/t cytopenias and intolerance; Kathleen Whitehead completed chemotherapy on 12/06/13. Kathleen Whitehead also received intravaginal brachytherapy, which completed on 10/17/13.  Post treatment PET scan in 04/2014 consistent with recurrent disease. Kathleen Whitehead went on to complete external beam radiation to the low periaortic area lymph node 28 fractions, and completing radiation on 07/15/14. Kathleen Whitehead was noted to be in remission on PET imaging on 08/05/14.  Subsequent PET scan on 06/11/16, revealed hypermetabolic aortocaval and right common iliac adenopathy compatible with malignancy.  Kathleen Whitehead began chemotherapy with Carbo/Gemzar on 07/3016. Kathleen Whitehead returns today for follow-up and consideration for chemotherapy.   Endometrial/Ovarian cancer:  -Kathleen Whitehead is here  for follow-up and consideration for cycle #1, day 8 today. Kathleen Whitehead is due for Gemzar alone today.   -However, her platelets are insufficient for treatment.  We will plan to hold her Gemzar today.  -Kathleen Whitehead will return to cancer center in 1 week for consideration of Day 8 Gemzar at that time.  Of note, Kathleen Whitehead may need dose-reduction given her previous chemotherapy and cytopenias.     Pancytopenia secondary to chemotherapy:  -Platelets 37,000 today. Denies any frank bleeding, aside from occasional blood when blowing her nose. No diffuse epistaxis. No other bleeding noted.   -WBC mildly low at 2.2; ANC 1500.   -Hemoglobin decreased at 10.8 today; likely treatment effect.  No need for transfusion at this time.  We will continue to closely monitor.   -We will hold treatment this week. Kathleen Whitehead knows to call the clinic or report to the ED with any fevers.    Transaminitis, likely secondary to chemotherapy:  -AST mildly elevated at 69 today.  Likely secondary to chemotherapy. We will continue to monitor. May require dose-reduction in the future, per Dr. Talbert Cage.   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  -Reports trying Gabapentin in the past, but did not find it to be helpful.  -Reinforced previous recommendations from Dr. Whitney Muse regarding referral to physical therapy/occupational therapy for peripheral neuropathy management; Kathleen Whitehead declined.   Nausea & Diarrhea, likely secondary to chemotherapy:  -Resolved -Continue Zofran and Imodium, as needed.     Treatment plan discussed with Dr. Sullivan Lone via phone in the absence of Dr. Talbert Cage; he agrees with the above plan of care.    Dispo:  -Hold Gemzar today.  -Return to cancer center in 1 week for consideration of Day 8 Gemzar. May  need dose-reduction of Carbo/Gemzar for subsequent cycles.    All questions were answered to patient's stated satisfaction. Encouraged  patient to call with any new concerns or questions before her next visit to the cancer center and we can certain see her sooner, if needed.    This document serves as a record of services personally performed by Mike Craze, NP. It was created on her behalf by Shirlean Mylar, a trained medical scribe. The creation of this record is based on the scribe's personal observations and the provider's statements to them. This document has been checked and approved by the attending provider.  I have reviewed the above documentation for accuracy and completeness and I agree with the above.  Mike Craze, NP [place] [phone]

## 2016-08-10 NOTE — Patient Instructions (Signed)
[facility] at [facility] Discharge Instructions  RECOMMENDATIONS MADE BY THE CONSULTANT AND ANY TEST RESULTS WILL BE SENT TO YOUR REFERRING PHYSICIAN.  You were seen today by Gretchen Dawson NP.   Thank you for choosing [facility] at [facility] to provide your oncology and hematology care.  To afford each patient quality time with our provider, please arrive at least 15 minutes before your scheduled appointment time.    If you have a lab appointment with the Cancer Center please come in thru the  Main Entrance and check in at the main information desk  You need to re-schedule your appointment should you arrive 10 or more minutes late.  We strive to give you quality time with our providers, and arriving late affects you and other patients whose appointments are after yours.  Also, if you no show three or more times for appointments you may be dismissed from the clinic at the providers discretion.     Again, thank you for choosing [facility].  Our hope is that these requests will decrease the amount of time that you wait before being seen by our physicians.       _____________________________________________________________  Should you have questions after your visit to [facility], please contact our office at [phone] between the hours of 8:30 a.m. and 4:30 p.m.  Voicemails left after 4:30 p.m. will not be returned until the following business day.  For prescription refill requests, have your pharmacy contact our office.       Resources For Cancer Patients and their Caregivers ? American Cancer Society: Can assist with transportation, wigs, general needs, runs Look Good Feel Better.        1-888-227-6333 ? Cancer Care: Provides financial assistance, online support groups, medication/co-pay assistance.  1-800-813-HOPE (4673) ? [facility] Assists [place] cancer patients and  their families through emotional , educational and financial support.  336-427-4357 ? [place] DSS Where to apply for food stamps, Medicaid and utility assistance. 336-342-1394 ? [facility]: Transportation to medical appointments. 336-347-2287 ? Social Security Administration: May apply for disability if have a Stage IV cancer. 336-342-7796 1-800-772-1213 ? [place] Aging, Disability and Transit Services: Assists with nutrition, care and transit needs. 336-349-2343  Cancer Center Support Programs: @10RELATIVEDAYS@ > Cancer Support Group  2nd Tuesday of the month 1pm-2pm, Journey Room  > Creative Journey  3rd Tuesday of the month 1130am-1pm, Journey Room  > Look Good Feel Better  1st Wednesday of the month 10am-12 noon, Journey Room (Call American Cancer Society to register 1-800-395-5775)    

## 2016-08-10 NOTE — Progress Notes (Signed)
Loryn Thousand tolerated port lab draw well without complaints or incident. Labs reviewed with Mike Craze NP and chemo tx held today due to low platelets of 37. Chemo rescheduled for next week. Pt instructed to call for any signs of active bleeding or fever greater than 100.4. Pt verbalized understanding. Port flushed easily per protocol. Pt discharged via wheelchair in satisfactory condition accompanied by her husband

## 2016-08-10 NOTE — Patient Instructions (Signed)
[place] at [facility] Discharge Instructions  RECOMMENDATIONS MADE BY THE CONSULTANT AND ANY TEST RESULTS WILL BE SENT TO YOUR REFERRING PHYSICIAN.  Labs drawn from port then flushed per protocol. Chemo treatment held due to low platelets. Follow-up as scheduled. Call clinic for any questions or concerns  Thank you for choosing [place] at [facility] to provide your oncology and hematology care.  To afford each patient quality time with our provider, please arrive at least 15 minutes before your scheduled appointment time.    If you have a lab appointment with the [place] please come in thru the  Main Entrance and check in at the main information desk  You need to re-schedule your appointment should you arrive 10 or more minutes late.  We strive to give you quality time with our providers, and arriving late affects you and other patients whose appointments are after yours.  Also, if you no show three or more times for appointments you may be dismissed from the clinic at the providers discretion.     Again, thank you for choosing [facility].  Our hope is that these requests will decrease the amount of time that you wait before being seen by our physicians.       _____________________________________________________________  Should you have questions after your visit to [facility], please contact our office at [phone] between the hours of 8:30 a.m. and 4:30 p.m.  Voicemails left after 4:30 p.m. will not be returned until the following business day.  For prescription refill requests, have your pharmacy contact our office.       Resources For Cancer Patients and their Caregivers ? American Cancer Society: Can assist with transportation, wigs, general needs, runs Look Good Feel Better.        331-356-2487 ? Cancer Care: Provides financial assistance, online support groups, medication/co-pay  assistance.  1-800-813-HOPE (551)857-8687) ? [place] Assists [place] cancer patients and their families through emotional , educational and financial support.  5740796037 ? [place] DSS Where to apply for food stamps, Medicaid and utility assistance. (313) 495-4113 ? [employer]: Transportation to medical appointments. 435-259-9933 ? Social Security Administration: May apply for disability if have a Stage IV cancer. (309)516-6654 973-738-8428 ? [employer], Disability and Transit Services: Assists with nutrition, care and transit needs. [place] Support Programs: @10RELATIVEDAYS @ > Cancer Support Group  2nd Tuesday of the month 1pm-2pm, Journey Room  > Creative Journey  3rd Tuesday of the month 1130am-1pm, Journey Room  > Look Good Feel Better  1st Wednesday of the month 10am-12 noon, Journey Room (Call [place] to register [phone])

## 2016-08-17 ENCOUNTER — Encounter (HOSPITAL_COMMUNITY): Payer: Self-pay

## 2016-08-17 ENCOUNTER — Encounter (HOSPITAL_BASED_OUTPATIENT_CLINIC_OR_DEPARTMENT_OTHER): Payer: Medicare HMO

## 2016-08-17 VITALS — BP 140/55 | HR 77 | Temp 98.2°F | Resp 16 | Wt 259.0 lb

## 2016-08-17 DIAGNOSIS — D696 Thrombocytopenia, unspecified: Secondary | ICD-10-CM

## 2016-08-17 DIAGNOSIS — C541 Malignant neoplasm of endometrium: Secondary | ICD-10-CM

## 2016-08-17 LAB — CBC WITH DIFFERENTIAL/PLATELET
BASOS ABS: 0 10*3/uL (ref 0.0–0.1)
Basophils Relative: 0 %
EOS PCT: 1 %
Eosinophils Absolute: 0 10*3/uL (ref 0.0–0.7)
HEMATOCRIT: 28 % — AB (ref 36.0–46.0)
HEMOGLOBIN: 9.3 g/dL — AB (ref 12.0–15.0)
Lymphocytes Relative: 65 %
Lymphs Abs: 1 10*3/uL (ref 0.7–4.0)
MCH: 30.7 pg (ref 26.0–34.0)
MCHC: 33.2 g/dL (ref 30.0–36.0)
MCV: 92.4 fL (ref 78.0–100.0)
MONO ABS: 0.1 10*3/uL (ref 0.1–1.0)
MONOS PCT: 9 %
NEUTROS ABS: 0.4 10*3/uL — AB (ref 1.7–7.7)
Neutrophils Relative %: 25 %
Platelets: <5 10*3/uL — CL (ref 150–400)
RBC: 3.03 MIL/uL — AB (ref 3.87–5.11)
RDW: 11.4 % — AB (ref 11.5–15.5)
WBC: 1.5 10*3/uL — AB (ref 4.0–10.5)

## 2016-08-17 LAB — COMPREHENSIVE METABOLIC PANEL
ALBUMIN: 2.9 g/dL — AB (ref 3.5–5.0)
ALK PHOS: 68 U/L (ref 38–126)
ALT: 44 U/L (ref 14–54)
ANION GAP: 9 (ref 5–15)
AST: 46 U/L — ABNORMAL HIGH (ref 15–41)
BILIRUBIN TOTAL: 0.4 mg/dL (ref 0.3–1.2)
BUN: 18 mg/dL (ref 6–20)
CALCIUM: 9.1 mg/dL (ref 8.9–10.3)
CO2: 24 mmol/L (ref 22–32)
Chloride: 99 mmol/L — ABNORMAL LOW (ref 101–111)
Creatinine, Ser: 0.98 mg/dL (ref 0.44–1.00)
GFR calc Af Amer: >60 mL/min (ref >60)
GFR calc non Af Amer: 59 mL/min — ABNORMAL LOW (ref >60)
Glucose, Bld: 205 mg/dL — ABNORMAL HIGH (ref 65–99)
POTASSIUM: 4.3 mmol/L (ref 3.5–5.1)
Sodium: 132 mmol/L — ABNORMAL LOW (ref 135–145)
TOTAL PROTEIN: 6.1 g/dL — AB (ref 6.5–8.1)

## 2016-08-17 LAB — TYPE AND SCREEN
ABO/RH(D): A POS
Antibody Screen: NEGATIVE

## 2016-08-17 MED ORDER — HEPARIN SOD (PORK) LOCK FLUSH 100 UNIT/ML IV SOLN
500.0000 [IU] | Freq: Every day | INTRAVENOUS | Status: AC | PRN
  Administered 2016-08-17: 500 [IU]
  Filled 2016-08-17: qty 5

## 2016-08-17 MED ORDER — SODIUM CHLORIDE 0.9% FLUSH
10.0000 mL | INTRAVENOUS | Status: AC | PRN
  Administered 2016-08-17: 10 mL

## 2016-08-17 MED ORDER — SODIUM CHLORIDE 0.9 % IV SOLN
250.0000 mL | Freq: Once | INTRAVENOUS | Status: AC
  Administered 2016-08-17: 250 mL via INTRAVENOUS

## 2016-08-17 MED ORDER — ACETAMINOPHEN 325 MG PO TABS
650.0000 mg | ORAL_TABLET | Freq: Once | ORAL | Status: AC
  Administered 2016-08-17: 650 mg via ORAL
  Filled 2016-08-17: qty 2

## 2016-08-17 MED ORDER — DIPHENHYDRAMINE HCL 25 MG PO CAPS
25.0000 mg | ORAL_CAPSULE | Freq: Once | ORAL | Status: AC
  Administered 2016-08-17: 25 mg via ORAL
  Filled 2016-08-17: qty 1

## 2016-08-17 NOTE — Progress Notes (Signed)
CRITICAL VALUE ALERT Critical value received:  Platelets 2.0 Date of notification:  08/16/16 Time of notification: [id number] Critical value read back:  Yes.   Nurse who received alert:  T.Williom Cedar,RN MD notified (1st page):  [phone]

## 2016-08-17 NOTE — Patient Instructions (Signed)
[place] at [facility] Discharge Instructions  RECOMMENDATIONS MADE BY THE CONSULTANT AND ANY TEST RESULTS WILL BE SENT TO YOUR REFERRING PHYSICIAN.  Gave you platelets today. Held chemo today Return Thursday for repeat blood work. Labs on 08/24/2016  Thank you for choosing [place] at [facility] to provide your oncology and hematology care.  To afford each patient quality time with our provider, please arrive at least 15 minutes before your scheduled appointment time.    If you have a lab appointment with the [place] please come in thru the  Main Entrance and check in at the main information desk  You need to re-schedule your appointment should you arrive 10 or more minutes late.  We strive to give you quality time with our providers, and arriving late affects you and other patients whose appointments are after yours.  Also, if you no show three or more times for appointments you may be dismissed from the clinic at the providers discretion.     Again, thank you for choosing [facility].  Our hope is that these requests will decrease the amount of time that you wait before being seen by our physicians.       _____________________________________________________________  Should you have questions after your visit to [facility], please contact our office at [phone] between the hours of 8:30 a.m. and 4:30 p.m.  Voicemails left after 4:30 p.m. will not be returned until the following business day.  For prescription refill requests, have your pharmacy contact our office.       Resources For Cancer Patients and their Caregivers ? American Cancer Society: Can assist with transportation, wigs, general needs, runs Look Good Feel Better.        661-484-1917 ? Cancer Care: Provides financial assistance, online support groups, medication/co-pay assistance.  1-800-813-HOPE 248-224-1895) ? [place] Assists [place] cancer patients and their families through emotional , educational and financial support.  4327053670 ? [place] DSS Where to apply for food stamps, Medicaid and utility assistance. 269 194 8363 ? RC[employer]: Transportation to medical appointments. 782-045-7359 ? Social Security Administration: May apply for disability if have a Stage IV cancer. ([phone] 908-157-6808 ? [employer], Disability and Transit Services: Assists with nutrition, care and transit needs. [place] Support Programs: @10RELATIVEDAYS @ > Cancer Support Group  2nd Tuesday of the month 1pm-2pm, Journey Room  > Creative Journey  3rd Tuesday of the month 1130am-1pm, Journey Room  > Look Good Feel Better  1st Wednesday of the month 10am-12 noon, Journey Room (Call [place] to register [phone])

## 2016-08-17 NOTE — Progress Notes (Signed)
Patient received on pack of platelets today per orders. Chemotherapy held for one week per MD, labs on Thursday. Vitals stable and discharged home via wheelchair with husband. Follow up as scheduled.

## 2016-08-18 LAB — PREPARE PLATELET PHERESIS: UNIT DIVISION: 0

## 2016-08-19 ENCOUNTER — Other Ambulatory Visit (HOSPITAL_COMMUNITY): Payer: Self-pay | Admitting: Oncology

## 2016-08-19 ENCOUNTER — Encounter (HOSPITAL_BASED_OUTPATIENT_CLINIC_OR_DEPARTMENT_OTHER): Payer: Medicare HMO

## 2016-08-19 ENCOUNTER — Other Ambulatory Visit (HOSPITAL_COMMUNITY): Payer: Medicare HMO

## 2016-08-19 DIAGNOSIS — C541 Malignant neoplasm of endometrium: Secondary | ICD-10-CM | POA: Diagnosis not present

## 2016-08-19 DIAGNOSIS — D696 Thrombocytopenia, unspecified: Secondary | ICD-10-CM | POA: Diagnosis not present

## 2016-08-19 LAB — CBC WITH DIFFERENTIAL/PLATELET
BASOS PCT: 0 %
Basophils Absolute: 0 10*3/uL (ref 0.0–0.1)
EOS PCT: 2 %
Eosinophils Absolute: 0 10*3/uL (ref 0.0–0.7)
HEMATOCRIT: 26.1 % — AB (ref 36.0–46.0)
HEMOGLOBIN: 9 g/dL — AB (ref 12.0–15.0)
Lymphocytes Relative: 67 %
Lymphs Abs: 0.9 10*3/uL (ref 0.7–4.0)
MCH: 31.6 pg (ref 26.0–34.0)
MCHC: 34.5 g/dL (ref 30.0–36.0)
MCV: 91.6 fL (ref 78.0–100.0)
MONOS PCT: 13 %
Monocytes Absolute: 0.2 10*3/uL (ref 0.1–1.0)
NEUTROS PCT: 18 %
Neutro Abs: 0.3 10*3/uL — ABNORMAL LOW (ref 1.7–7.7)
Platelets: 24 10*3/uL — CL (ref 150–400)
RBC: 2.85 MIL/uL — ABNORMAL LOW (ref 3.87–5.11)
RDW: 12.4 % (ref 11.5–15.5)
WBC: 1.4 10*3/uL — CL (ref 4.0–10.5)

## 2016-08-19 MED ORDER — HEPARIN SOD (PORK) LOCK FLUSH 100 UNIT/ML IV SOLN
INTRAVENOUS | Status: AC
  Filled 2016-08-19: qty 5

## 2016-08-19 MED ORDER — SODIUM CHLORIDE 0.9% FLUSH
10.0000 mL | INTRAVENOUS | Status: AC | PRN
  Administered 2016-08-19: 10 mL

## 2016-08-19 MED ORDER — HEPARIN SOD (PORK) LOCK FLUSH 100 UNIT/ML IV SOLN
500.0000 [IU] | Freq: Every day | INTRAVENOUS | Status: AC | PRN
  Administered 2016-08-19: 500 [IU]
  Filled 2016-08-19: qty 5

## 2016-08-19 MED ORDER — ACETAMINOPHEN 325 MG PO TABS
650.0000 mg | ORAL_TABLET | Freq: Once | ORAL | Status: AC
  Administered 2016-08-19: 650 mg via ORAL
  Filled 2016-08-19: qty 2

## 2016-08-19 MED ORDER — DIPHENHYDRAMINE HCL 25 MG PO CAPS
25.0000 mg | ORAL_CAPSULE | Freq: Once | ORAL | Status: AC
  Administered 2016-08-19: 25 mg via ORAL
  Filled 2016-08-19: qty 1

## 2016-08-19 MED ORDER — SODIUM CHLORIDE 0.9 % IV SOLN
250.0000 mL | Freq: Once | INTRAVENOUS | Status: AC
  Administered 2016-08-19: 250 mL via INTRAVENOUS

## 2016-08-19 NOTE — Patient Instructions (Signed)
[place] at [facility] Discharge Instructions  RECOMMENDATIONS MADE BY THE CONSULTANT AND ANY TEST RESULTS WILL BE SENT TO YOUR REFERRING PHYSICIAN.  Received 1 unit of platelets today. Follow-up as scheduled. Call clinic for any questions or concerns  Thank you for choosing [place] at [facility] to provide your oncology and hematology care.  To afford each patient quality time with our provider, please arrive at least 15 minutes before your scheduled appointment time.    If you have a lab appointment with the [place] please come in thru the  Main Entrance and check in at the main information desk  You need to re-schedule your appointment should you arrive 10 or more minutes late.  We strive to give you quality time with our providers, and arriving late affects you and other patients whose appointments are after yours.  Also, if you no show three or more times for appointments you may be dismissed from the clinic at the providers discretion.     Again, thank you for choosing [facility].  Our hope is that these requests will decrease the amount of time that you wait before being seen by our physicians.       _____________________________________________________________  Should you have questions after your visit to [facility], please contact our office at [phone] between the hours of 8:30 a.m. and 4:30 p.m.  Voicemails left after 4:30 p.m. will not be returned until the following business day.  For prescription refill requests, have your pharmacy contact our office.       Resources For Cancer Patients and their Caregivers ? American Cancer Society: Can assist with transportation, wigs, general needs, runs Look Good Feel Better.        931-183-4838 ? Cancer Care: Provides financial assistance, online support groups, medication/co-pay assistance.  1-800-813-HOPE (904) 022-5072) ? [place] Assists [place] cancer patients and their families through emotional , educational and financial support.  2016629782 ? [place] DSS Where to apply for food stamps, Medicaid and utility assistance. (270)429-8500 ? [employer]: Transportation to medical appointments. 902 570 6005 ? Social Security Administration: May apply for disability if have a Stage IV cancer. [phone] 671-613-2137 ? [employer], Disability and Transit Services: Assists with nutrition, care and transit needs. [place] Support Programs: @10RELATIVEDAYS @ > Cancer Support Group  2nd Tuesday of the month 1pm-2pm, Journey Room  > Creative Journey  3rd Tuesday of the month 1130am-1pm, Journey Room  > Look Good Feel Better  1st Wednesday of the month 10am-12 noon, Journey Room (Call [place] to register [phone])

## 2016-08-19 NOTE — Progress Notes (Signed)
Kathleen Whitehead tolerated platelet transfusion well without complaints or incident.Labs including platelets 24 and low WBC and ANC reviewed with Kirby Crigler PA-C and I unit of pheresis platelets ordered for today. VSS upon discharge.Pt reminded to call for fever or chills Pt discharged via wheelchair in satisfactory condition accompanied by her husband

## 2016-08-19 NOTE — Progress Notes (Signed)
CRITICAL VALUE ALERT Critical value received:  WBC- 1.4 Date of notification:  08/19/16 Time of notification: [id number] Critical value read back:  Yes.   Nurse who received alert:  M.Jodi Criscuolo, LPN MD notified (1st page):  T.Kefalas, PA-C  CRITICAL VALUE ALERT Critical value received:  Platelets-24 Date of notification:  08/19/16 Time of notification: [id number] Critical value read back:  Yes.   Nurse who received alert:  M.Persephonie Hegwood, LPN  MD notified (1st page):  T.Kefalas, PA-C

## 2016-08-20 LAB — PREPARE PLATELET PHERESIS: UNIT DIVISION: 0

## 2016-08-23 ENCOUNTER — Ambulatory Visit (HOSPITAL_COMMUNITY): Payer: Medicare HMO | Admitting: Hematology & Oncology

## 2016-08-23 NOTE — Progress Notes (Signed)
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 [name] and associates, pathology could not be certain that these were not 2 separate primaries rather than one metastatic process from the uterus to the ovary.      08/15/2013 - 12/06/2013 Chemotherapy    Carboplatin/Taxol with dose reduction of 50% and Taxol due to grade 2+ peripheral neuropathy and gabapentin induced nightmare/hallucinations. 5 out of 6 cycles given with the final cycle being cancelled secondary to significant thrombocytopenia/intolerance.      09/10/2013 - 10/17/2013 Radiation Therapy    5040 cGy delivered by EBRT followed by intravaginal brachytherapy.      04/30/2014 PET scan    PET scan consistent with recurrent disease of either ovarian cancer or endometrial cancer.      06/03/2014 - 07/15/2014 Radiation Therapy    5040 cGy over 28 fractions delivered to the low periaortic area lymph node either metastatic endometrial or metastatic or ovarian cancer.      07/19/2014 Imaging    Imaging concerning for new mesenteric disease and subdiaphragmatic disease.      08/05/2014 PET scan    No evidence of progression of disease.      08/05/2014 Remission    Negative PET scan.      06/11/2016 PET scan    1. Hypermetabolic aortocaval and right common iliac adenopathy, maximum SUV 17.1, compatible with malignancy. 2. Calcified left mesenteric mass has only low-grade activity, maximum SUV 5.3, merit surveillance. 3. There several small calcifications along the omentum which are not currently metabolically  active. 4. Along the scarring in operative site from prior laparotomy there some faintly accentuated metabolic activity which is probably related to the original wound and unlikely to be from tumor. 5. Coronary, aortic arch, and branch vessel atherosclerotic vascular disease.      06/11/2016 Relapse/Recurrence    PET scan with aortocaval & right iliac adenopathy consistent with malignancy.       08/03/2016 -  Chemotherapy    Carboplatin Day 1/Gemzar Days 1&8 every 21 days          HISTORY OF PRESENT ILLNESS:  As per Dr. Donald Pore note on 06/15/2016: "Kathleen Whitehead 66 y.o. female returns for followup of Ovarian Cancer/Endometrial cancer, S/P TAH-BSO by Dr. [name] with unsure pathology regarding one primary with metastatic disease versus two separate primaries followed by Carboplatin/Taxol x 5/6 cycles (last cycle cancelled due to intolerance and thrombocytopenia), followed by EBRT and intravaginal brachytherapy.  She did have a recurrence in October 2015 on PET imaging requiring another treatment with EBRT.  Finishing all therapy on 07/15/2014.  Pre-operative CA 125 was elevated."    INTERVAL HISTORY:  Kathleen Whitehead presents today for consideration of Cycle #2, Day 1 of Carboplatin D1/ Gemcitabine D1,8 every 21 days.   With cycle #1, she experienced significant cytopenias after Day 1 of Carbo/Gemcitabine requiring platelet transfusions. She received 1 unit platelets on 08/17/16 (platelet count <5,000) and 1 unit platelets again on 08/19/16 (platelet count 24,000). She tolerated the transfusions without incident.  Denies any frank bleeding in her stools,  urine, nosebleeds, or oral cavity bleeding.   She tells me "I actually felt better after this treatment than I did with my treatments in the past."  Endorses 1 day of flu-like joint aches/pains, diarrhea, & nausea about 3 days after chemotherapy.  Symptoms then resolve and she feels relatively well, aside from continuing fatigue.  She  feels like the fatigue is slowly improving.    She continues to have pain/burning in her hands and feet.  She reports pain in her right hip and buttock; it is difficult for her to describe this pain-"it just hurts."  She has tried Lyrica in the past, but had LE edema and stopped the medication.  She manages her pain with Percocet 1-2 tabs every 4-6 hours around-the-clock.  The Percocet "helps dull the pain a little so I can function."  She has tried gabapentin in the past, but it was reportedly ineffective.  She can't take Cymbalta.  She is able to feed herself, but needs help getting dressed, showering, and other things around the house. She uses a walker at home to ambulate.  She has occasional right hip pain, which she attributes to sciatica that started after her initial radiation treatments.    Denies cough, SOB, mouth sores, bleeding from gums, falls, melena, blood in stool, dysuria, hematuria, and vaginal bleeding,.     REVIEW OF SYSTEMS:  Review of Systems  Constitutional: Positive for fatigue. Negative for fever.  HENT:  Negative.  Negative for mouth sores and nosebleeds.        Endorses occasional blood-tinged nasal drainage; denies frank epistaxis.   Eyes: Negative.   Respiratory: Negative.  Negative for cough and shortness of breath.   Cardiovascular: Negative.   Gastrointestinal: Positive for abdominal pain (sometimes), diarrhea (only after chemotherapy, then resolves ) and nausea (alleviated with zofran). Negative for blood in stool.       Denies melena   Endocrine: Negative.   Genitourinary: Negative.  Negative for dysuria, hematuria and vaginal bleeding.   Musculoskeletal: Positive for arthralgias.       Denies falls (R) hip and buttock pain.   Skin: Negative.   Neurological: Positive for numbness ( neuropathy in hands, legs and feet. ). Negative for dizziness and headaches.  Hematological: Negative.  Does not bruise/bleed easily.  Psychiatric/Behavioral: Negative.   All  other systems reviewed and are negative.    PAST MEDICAL/SURGICAL HISTORY:  Past Medical History:  Diagnosis Date  . Anemia   . Chemotherapy induced neutropenia ([facility])   . Chemotherapy induced thrombocytopenia   . Diabetes mellitus without complication ([facility])   . Endometrial cancer ([facility])   . Hot flashes   . Hypertension   . Malignant neoplasm ([place] [facility])   . Malignant neoplasm of ovary ([facility]) 04/30/2013  . Obesity   . Ovarian cancer ([facility])   . Pancytopenia ([facility])   . Peripheral artery disease ([place])   . Peripheral neuropathy ([place])   . Port-a-cath in place    History reviewed. No pertinent surgical history.   SOCIAL HISTORY:  Social History   Social History  . Marital status: Married    Spouse name: N/A  . Number of children: N/A  . Years of education: N/A   Occupational History  . Not on file.   Social History Main Topics  . Smoking status: Never Smoker  . Smokeless tobacco: Never Used  . Alcohol use No  . Drug use: No  . Sexual activity: Not on file   Other Topics Concern  . Not on  file   Social History Narrative  . No narrative on file    FAMILY HISTORY:  Family History  Problem Relation Age of Onset  . Heart failure Mother   . Stroke Sister   . Cancer Sister   . Diabetes Sister   . Leukemia Sister   . Diabetes Brother     CURRENT MEDICATIONS:  Outpatient Encounter Prescriptions as of 08/24/2016  Medication Sig Note  . acetaminophen (TYLENOL) 500 MG tablet Take by mouth. 01/15/2016: Received from: [facility]  . aspirin EC 325 MG tablet  01/15/2016: Received from: [facility]  . CARBOPLATIN IV Inject into the vein.   . cetirizine (ZYRTEC) 10 MG tablet Take by mouth. 01/15/2016: Received from: [facility]  . Gemcitabine HCl (GEMZAR IV) Inject into the vein.   Marland Kitchen glipiZIDE (GLUCOTROL) 10 MG tablet TAKE 1 TABLET BY MOUTH TWICE A DAY FOR DIABETES 03/02/2016: Received from: External Pharmacy  . lidocaine-prilocaine (EMLA) cream Apply a quarter size amount to  affected area 1 hour prior to coming to chemotherapy.   Marland Kitchen lisinopril (PRINIVIL,ZESTRIL) 40 MG tablet Take by mouth. 01/15/2016: Received from: [facility]  . loperamide (IMODIUM) 2 MG capsule Take by mouth. 01/15/2016: Received from: [facility]  . metFORMIN (GLUCOPHAGE) 1000 MG tablet Take by mouth. 01/15/2016: Received from: [facility]  . Omega-3 Fatty Acids (FISH OIL) 1000 MG CAPS Take by mouth. 01/15/2016: Received from: [facility]  . ondansetron (ZOFRAN) 8 MG tablet Take 1 tablet (8 mg total) by mouth every 8 (eight) hours as needed for nausea or vomiting.   . ondansetron (ZOFRAN-ODT) 4 MG disintegrating tablet Take by mouth. 01/15/2016: Received from: [facility]  . oxyCODONE-acetaminophen (PERCOCET) 10-325 MG tablet Take 1 tablet by mouth every 4 (four) hours as needed for pain.   . polyethylene glycol powder (MIRALAX) powder Take by mouth. 01/15/2016: Received from: [facility]  . prochlorperazine (COMPAZINE) 10 MG tablet Take 1 tablet (10 mg total) by mouth every 6 (six) hours as needed for nausea or vomiting.   . fentaNYL (DURAGESIC - DOSED MCG/HR) 12 MCG/HR Place 1 patch (12.5 mcg total) onto the skin every 3 (three) days.   . [DISCONTINUED] LYRICA 75 MG capsule Take 75 mg by mouth daily.    No facility-administered encounter medications on file as of 08/24/2016.      ALLERGIES:  Allergies  Allergen Reactions  . Diphenhydramine Palpitations  . Iodinated Diagnostic Agents Other (See Comments)  . Duloxetine Hcl Nausea And Vomiting  . Ibuprofen Other (See Comments)    Makes my bones hurt  . Penicillins Rash     Vitals:   08/24/16 0912  BP: (!) 149/41  Pulse: 77  Resp: 18    PHYSICAL EXAM:  ECOG Performance status: 2-3 - Symptomatic; requires assistance with ADLs   Physical Exam  Constitutional: She is oriented to person, place, and time and well-developed, well-nourished, and in no distress.  HENT:  White[place]: Normocephalic and atraumatic.  Mouth/Throat:  Oropharynx is clear and moist. No oropharyngeal exudate.  Eyes: Conjunctivae are normal. Pupils are equal, round, and reactive to light. No scleral icterus.  Neck: Normal range of motion. Neck supple.  Cardiovascular: Normal rate, regular rhythm and normal heart sounds.   Pulmonary/Chest: Effort normal and breath sounds normal. No respiratory distress.  Abdominal: Soft. Bowel sounds are normal. There is no tenderness.  Musculoskeletal: Normal range of motion. She exhibits edema (left hand edema (chronic since 2013 after a fracture per patient) ).  1+ pitting edema in bilateral  legs/ankles/feet   Lymphadenopathy:    She has no cervical adenopathy.  Neurological: She is alert and oriented to person, place, and time.  Shuffling gait   Skin: Skin is warm and dry. There is pallor.  Psychiatric: Mood, memory, affect and judgment normal.  Nursing note and vitals reviewed.    LABORATORY DATA:  I have reviewed the labs as listed.  CBC    Component Value Date/Time   WBC 2.1 (L) 08/24/2016 0940   RBC 3.00 (L) 08/24/2016 0940   HGB 9.5 (L) 08/24/2016 0940   HCT 27.5 (L) 08/24/2016 0940   PLT 32 (L) 08/24/2016 0940   MCV 91.7 08/24/2016 0940   MCH 31.7 08/24/2016 0940   MCHC 34.5 08/24/2016 0940   RDW 12.6 08/24/2016 0940   LYMPHSABS 1.0 08/24/2016 0940   MONOABS 0.3 08/24/2016 0940   EOSABS 0.0 08/24/2016 0940   BASOSABS 0.0 08/24/2016 0940   CMP Latest Ref Rng & Units 08/24/2016 08/17/2016 08/10/2016  Glucose 65 - 99 mg/dL 234(H) 205(H) 229(H)  BUN 6 - 20 mg/dL 16 18 16   Creatinine 0.44 - 1.00 mg/dL 1.24(H) 0.98 1.06(H)  Sodium 135 - 145 mmol/L 135 132(L) 131(L)  Potassium 3.5 - 5.1 mmol/L 4.5 4.3 4.4  Chloride 101 - 111 mmol/L 99(L) 99(L) 99(L)  CO2 22 - 32 mmol/L 28 24 23   Calcium 8.9 - 10.3 mg/dL 9.1 9.1 8.4(L)  Total Protein 6.5 - 8.1 g/dL 6.4(L) 6.1(L) 6.3(L)  Total Bilirubin 0.3 - 1.2 mg/dL 0.4 0.4 0.6  Alkaline Phos 38 - 126 U/L 71 68 69  AST 15 - 41 U/L 27 46(H) 69(H)  ALT  14 - 54 U/L 23 44 54    PENDING LABS:    DIAGNOSTIC IMAGING:  I have personally reviewed the radiologic reports and agree with below results.   PET scan: 06/11/16 CLINICAL DATA:  Subsequent Treatment strategy for uterine and ovarian cancer with increasing tumor markers appear.  EXAM: NUCLEAR MEDICINE PET SKULL BASE TO THIGH  TECHNIQUE: 12.3 mCi F-18 FDG was injected intravenously. Full-ring PET imaging was performed from the skull base to thigh after the radiotracer. CT data was obtained and used for attenuation correction and anatomic localization.  FASTING BLOOD GLUCOSE:  Value: 86 mg/dl  COMPARISON:  Multiple exams, including CT abdomen and pelvis dated 07/19/2014  FINDINGS: NECK  No hypermetabolic lymph nodes in the neck.  CHEST  No hypermetabolic mediastinal or hilar nodes. No suspicious pulmonary nodules on the CT data. Coronary, aortic arch, and branch vessel atherosclerotic vascular disease.  ABDOMEN/PELVIS  No abnormal hypermetabolic activity within the liver, pancreas, adrenal glands, or spleen. Clustered right periaortic lymph nodes below the level of the renal veins, the primarily posterior to the IVC, measuring up to about 1.3 cm in short axis on image 130/4, maximum SUV 17.1. Abnormal hypermetabolic nodes extend into the right common iliac chain, index right iliac node 1.1 cm in short axis on image 143/4. These nodes are increased compared to 07/19/14.  Calcified left mesenteric mass measuring 3.3 by 2.2 cm, metabolic activity is similar to the surrounding small bowel, maximum SUV approximately 5.3  Along the surgical site in the right rectus abdominus there is some very faintly increased activity, maximum SUV 4.0, probably simply postoperative.  Faint curvilinear calcification in the left upper quadrant near the splenic flexure on image 122/4 is not hypermetabolic and a least 6 mm in diameter.  There some small calcifications  along the omentum.  Chronic presacral edema.  Uterus and ovaries absent.  SKELETON  No focal hypermetabolic activity to suggest skeletal metastasis.  IMPRESSION: 1. Hypermetabolic aortocaval and right common iliac adenopathy, maximum SUV 17.1, compatible with malignancy. 2. Calcified left mesenteric mass has only low-grade activity, maximum SUV 5.3, merit surveillance. 3. There several small calcifications along the omentum which are not currently metabolically active. 4. Along the scarring in operative site from prior laparotomy there some faintly accentuated metabolic activity which is probably related to the original wound and unlikely to be from tumor. 5. Coronary, aortic arch, and branch vessel atherosclerotic vascular disease.   Electronically Signed   By: Van Clines M.D.   On: 06/11/2016 13:05      PATHOLOGY:     ASSESSMENT & PLAN:  Ms. Sparby is a pleasant 66 y.o. 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 She went on to receive adjuvant chemotherapy with Carbo/Taxol x 5 cycles requiring 50% dose reduction d/t peripheral neuropathy; 6th cycle held d/t cytopenias and intolerance; she completed chemotherapy on 12/06/13. She also received intravaginal brachytherapy, which completed on 10/17/13.  Post treatment PET scan in 04/2014 consistent with recurrent disease. She went on to complete external beam radiation to the low periaortic area lymph node 28 fractions, and completing radiation on 07/15/14. She was noted to be in remission on PET imaging on 08/05/14.  Subsequent PET scan on 06/11/16, revealed hypermetabolic aortocaval and right common iliac adenopathy compatible with malignancy.  She began chemotherapy with Carbo/Gemzar on 07/3016. She returns today for follow-up and consideration for chemotherapy.    Endometrial/Ovarian cancer:  -Ms.  Rozak is here for routine follow-up. Her 1st cycle of Carbo/Gemzar was complicated by severe thrombocytopenia after day 1, requiring platelet transfusion x 2.  -Labs collected today to follow-up on thrombocytopenia. Platelet count remains low at 32,000.  -Discussed case with Dr. Talbert Cage; we will likely need to dose-reduce both the Carbo and Gemzar for subsequent cycles.  -Return to cancer center in 1 week for cycle #2, day 1 Carbo/Gemzar.   Pancytopenia secondary to chemotherapy:  -Platelets 32,000 today. Denies any frank bleeding, aside from occasional blood when blowing her nose. No diffuse epistaxis. No other bleeding noted.   -WBC low at 2.1; ANC 800.  Reinforced neutropenic precautions.  -Hemoglobin decreased at 9.5 today; likely treatment effect.  No need for transfusion at this time.  We will continue to closely monitor.    Transaminitis, likely secondary to chemotherapy, improved:  -AST previously mildly elevated at 69; likely secondary to chemotherapy.  -AST/ALT normal today. We will continue to monitor.   Peripheral neuropathy & arthralgias, likely secondary to chemotherapy:  -She previously stopped Lyrica d/t lower extremity edema; she is not interested in resuming.  -Reports trying Gabapentin in the past, but did not find it to be helpful.  -She takes Percocet around-the-clock for pain control.  We discussed the use of a long-acting analgesic, like duragesic patch, for better pain control.  We discussed the benefits/risks of Fentanyl patch.  I gave her specific instructions with use including how to apply, safety concerns, possible side effects, etc.  She agreed to proceed with giving the Fentanyl a try.   -Based on her daily Percocet use, her MEDD is approximately 60+.  Accounting for possible cross-reactivity, I will start her on Fentanyl patch 12.5 mcg TD Q3days, #10, no refills; she understands the Fentanyl patch may take 48 hours to reach maximum efficacy.    Constipation  prevention in the setting of  opiate use:  -Reinforced the importance of maintaining an adequate bowel regimen with stool softeners/gentle laxatives like Miralax, as appropriate, given increase in opiate therapy.   Nausea & Diarrhea, likely secondary to chemotherapy:  -Resolved -Continue Zofran and Imodium, as needed.       Dispo:  -Return to cancer center in 1 week for consideration for cycle #2, day 1 Carbo/Gemzar (will likely require dose-reduction).    All questions were answered to patient's stated satisfaction. Encouraged patient to call with any new concerns or questions before her next visit to the cancer center and we can certain see her sooner, if needed.    Plan of care discussed with Dr. Twana First, who agrees with the above aforementioned.    Orders placed this encounter:  No orders of the defined types were placed in this encounter.   Mike Craze, NP [facility] [phone]

## 2016-08-24 ENCOUNTER — Ambulatory Visit (HOSPITAL_COMMUNITY): Payer: Medicare HMO

## 2016-08-24 ENCOUNTER — Encounter (HOSPITAL_BASED_OUTPATIENT_CLINIC_OR_DEPARTMENT_OTHER): Payer: Medicare HMO

## 2016-08-24 ENCOUNTER — Encounter (HOSPITAL_COMMUNITY): Payer: Medicare HMO | Attending: Adult Health | Admitting: Adult Health

## 2016-08-24 ENCOUNTER — Encounter (HOSPITAL_COMMUNITY): Payer: Self-pay | Admitting: Adult Health

## 2016-08-24 VITALS — BP 149/41 | HR 77 | Resp 18 | Wt 260.3 lb

## 2016-08-24 DIAGNOSIS — R599 Enlarged lymph nodes, unspecified: Secondary | ICD-10-CM | POA: Insufficient documentation

## 2016-08-24 DIAGNOSIS — Z95828 Presence of other vascular implants and grafts: Secondary | ICD-10-CM

## 2016-08-24 DIAGNOSIS — G629 Polyneuropathy, unspecified: Secondary | ICD-10-CM | POA: Diagnosis not present

## 2016-08-24 DIAGNOSIS — Z923 Personal history of irradiation: Secondary | ICD-10-CM | POA: Insufficient documentation

## 2016-08-24 DIAGNOSIS — R740 Nonspecific elevation of levels of transaminase and lactic acid dehydrogenase [LDH]: Secondary | ICD-10-CM | POA: Insufficient documentation

## 2016-08-24 DIAGNOSIS — E1151 Type 2 diabetes mellitus with diabetic peripheral angiopathy without gangrene: Secondary | ICD-10-CM | POA: Insufficient documentation

## 2016-08-24 DIAGNOSIS — D61810 Antineoplastic chemotherapy induced pancytopenia: Secondary | ICD-10-CM | POA: Diagnosis not present

## 2016-08-24 DIAGNOSIS — M2550 Pain in unspecified joint: Secondary | ICD-10-CM

## 2016-08-24 DIAGNOSIS — C541 Malignant neoplasm of endometrium: Secondary | ICD-10-CM | POA: Diagnosis not present

## 2016-08-24 DIAGNOSIS — D6959 Other secondary thrombocytopenia: Secondary | ICD-10-CM | POA: Diagnosis not present

## 2016-08-24 DIAGNOSIS — E669 Obesity, unspecified: Secondary | ICD-10-CM | POA: Diagnosis not present

## 2016-08-24 DIAGNOSIS — Z880 Allergy status to penicillin: Secondary | ICD-10-CM | POA: Diagnosis not present

## 2016-08-24 DIAGNOSIS — T451X5A Adverse effect of antineoplastic and immunosuppressive drugs, initial encounter: Secondary | ICD-10-CM

## 2016-08-24 DIAGNOSIS — D701 Agranulocytosis secondary to cancer chemotherapy: Secondary | ICD-10-CM | POA: Insufficient documentation

## 2016-08-24 DIAGNOSIS — C569 Malignant neoplasm of unspecified ovary: Secondary | ICD-10-CM | POA: Diagnosis not present

## 2016-08-24 DIAGNOSIS — Z8249 Family history of ischemic heart disease and other diseases of the circulatory system: Secondary | ICD-10-CM | POA: Insufficient documentation

## 2016-08-24 DIAGNOSIS — Z7982 Long term (current) use of aspirin: Secondary | ICD-10-CM | POA: Insufficient documentation

## 2016-08-24 DIAGNOSIS — R197 Diarrhea, unspecified: Secondary | ICD-10-CM | POA: Diagnosis not present

## 2016-08-24 DIAGNOSIS — R748 Abnormal levels of other serum enzymes: Secondary | ICD-10-CM

## 2016-08-24 DIAGNOSIS — Z823 Family history of stroke: Secondary | ICD-10-CM | POA: Diagnosis not present

## 2016-08-24 DIAGNOSIS — G620 Drug-induced polyneuropathy: Secondary | ICD-10-CM

## 2016-08-24 DIAGNOSIS — M25551 Pain in right hip: Secondary | ICD-10-CM

## 2016-08-24 DIAGNOSIS — Z79899 Other long term (current) drug therapy: Secondary | ICD-10-CM | POA: Diagnosis not present

## 2016-08-24 DIAGNOSIS — Z7984 Long term (current) use of oral hypoglycemic drugs: Secondary | ICD-10-CM | POA: Insufficient documentation

## 2016-08-24 DIAGNOSIS — Z9221 Personal history of antineoplastic chemotherapy: Secondary | ICD-10-CM | POA: Diagnosis not present

## 2016-08-24 DIAGNOSIS — Z833 Family history of diabetes mellitus: Secondary | ICD-10-CM | POA: Insufficient documentation

## 2016-08-24 DIAGNOSIS — Z90710 Acquired absence of both cervix and uterus: Secondary | ICD-10-CM | POA: Insufficient documentation

## 2016-08-24 DIAGNOSIS — Z806 Family history of leukemia: Secondary | ICD-10-CM | POA: Insufficient documentation

## 2016-08-24 DIAGNOSIS — I10 Essential (primary) hypertension: Secondary | ICD-10-CM | POA: Diagnosis not present

## 2016-08-24 DIAGNOSIS — M791 Myalgia: Secondary | ICD-10-CM | POA: Diagnosis not present

## 2016-08-24 DIAGNOSIS — Z888 Allergy status to other drugs, medicaments and biological substances status: Secondary | ICD-10-CM | POA: Diagnosis not present

## 2016-08-24 DIAGNOSIS — G8918 Other acute postprocedural pain: Secondary | ICD-10-CM

## 2016-08-24 LAB — CBC WITH DIFFERENTIAL/PLATELET
BASOS ABS: 0 10*3/uL (ref 0.0–0.1)
Basophils Relative: 0 %
EOS ABS: 0 10*3/uL (ref 0.0–0.7)
Eosinophils Relative: 1 %
HCT: 27.5 % — ABNORMAL LOW (ref 36.0–46.0)
Hemoglobin: 9.5 g/dL — ABNORMAL LOW (ref 12.0–15.0)
LYMPHS PCT: 45 %
Lymphs Abs: 1 10*3/uL (ref 0.7–4.0)
MCH: 31.7 pg (ref 26.0–34.0)
MCHC: 34.5 g/dL (ref 30.0–36.0)
MCV: 91.7 fL (ref 78.0–100.0)
Monocytes Absolute: 0.3 10*3/uL (ref 0.1–1.0)
Monocytes Relative: 14 %
NEUTROS PCT: 40 %
Neutro Abs: 0.8 10*3/uL — ABNORMAL LOW (ref 1.7–7.7)
PLATELETS: 32 10*3/uL — AB (ref 150–400)
RBC: 3 MIL/uL — AB (ref 3.87–5.11)
RDW: 12.6 % (ref 11.5–15.5)
WBC: 2.1 10*3/uL — AB (ref 4.0–10.5)

## 2016-08-24 LAB — COMPREHENSIVE METABOLIC PANEL
ALBUMIN: 2.9 g/dL — AB (ref 3.5–5.0)
ALT: 23 U/L (ref 14–54)
AST: 27 U/L (ref 15–41)
Alkaline Phosphatase: 71 U/L (ref 38–126)
Anion gap: 8 (ref 5–15)
BUN: 16 mg/dL (ref 6–20)
CHLORIDE: 99 mmol/L — AB (ref 101–111)
CO2: 28 mmol/L (ref 22–32)
Calcium: 9.1 mg/dL (ref 8.9–10.3)
Creatinine, Ser: 1.24 mg/dL — ABNORMAL HIGH (ref 0.44–1.00)
GFR calc Af Amer: 51 mL/min — ABNORMAL LOW (ref >60)
GFR, EST NON AFRICAN AMERICAN: 44 mL/min — AB (ref >60)
Glucose, Bld: 234 mg/dL — ABNORMAL HIGH (ref 65–99)
POTASSIUM: 4.5 mmol/L (ref 3.5–5.1)
SODIUM: 135 mmol/L (ref 135–145)
Total Bilirubin: 0.4 mg/dL (ref 0.3–1.2)
Total Protein: 6.4 g/dL — ABNORMAL LOW (ref 6.5–8.1)

## 2016-08-24 MED ORDER — FENTANYL 12 MCG/HR TD PT72: 12.5000 ug | MEDICATED_PATCH | TRANSDERMAL | 0 refills | Status: DC

## 2016-08-24 MED ORDER — SODIUM CHLORIDE 0.9% FLUSH
20.0000 mL | Freq: Once | INTRAVENOUS | Status: AC
  Administered 2016-08-24: 20 mL via INTRAVENOUS

## 2016-08-24 NOTE — Progress Notes (Signed)
Kathleen Whitehead presented for Portacath access and flush.  Portacath located right chest wall accessed with  H 20 needle.  Good blood return present. Portacath flushed with 37ml NS and 500U/42ml Heparin and needle removed intact.  Procedure tolerated well and without incident.

## 2016-08-24 NOTE — Patient Instructions (Addendum)
[place] at [facility] Discharge Instructions  RECOMMENDATIONS MADE BY THE CONSULTANT AND ANY TEST RESULTS WILL BE SENT TO YOUR REFERRING PHYSICIAN.  Exam with Mike Craze, NP. Return to the clinic for follow up with your next treatment. Please see Amy for appointments.    Thank you for choosing [place] at [facility] to provide your oncology and hematology care.  To afford each patient quality time with our provider, please arrive at least 15 minutes before your scheduled appointment time.    If you have a lab appointment with the [facility] please come in thru the  Main Entrance and check in at the main information desk  You need to re-schedule your appointment should you arrive 10 or more minutes late.  We strive to give you quality time with our providers, and arriving late affects you and other patients whose appointments are after yours.  Also, if you no show three or more times for appointments you may be dismissed from the clinic at the providers discretion.     Again, thank you for choosing [facility].  Our hope is that these requests will decrease the amount of time that you wait before being seen by our physicians.       _____________________________________________________________  Should you have questions after your visit to [facility], please contact our office at [phone] between the hours of 8:30 a.m. and 4:30 p.m.  Voicemails left after 4:30 p.m. will not be returned until the following business day.  For prescription refill requests, have your pharmacy contact our office.       Resources For Cancer Patients and their Caregivers ? American Cancer Society: Can assist with transportation, wigs, general needs, runs Look Good Feel Better.        781-390-2265 ? Cancer Care: Provides financial assistance, online support groups, medication/co-pay assistance.  1-800-813-HOPE  928-427-1415) ? [facility] Assists [place] cancer patients and their families through emotional , educational and financial support.  418-395-5502 ? [place] DSS Where to apply for food stamps, Medicaid and utility assistance. 825-162-7027 ? RCATS: Transportation to medical appointments. 615 763 9737 ? Social Security Administration: May apply for disability if have a Stage IV cancer. 640-090-9761 845-813-8085 ? [employer], Disability and Transit Services: Assists with nutrition, care and transit needs. [facility] Support Programs: @10RELATIVEDAYS @ > Cancer Support Group  2nd Tuesday of the month 1pm-2pm, Journey Room  > Creative Journey  3rd Tuesday of the month 1130am-1pm, Journey Room  > Look Good Feel Better  1st Wednesday of the month 10am-12 noon, Journey Room (Call [facility] to register [phone])

## 2016-08-31 ENCOUNTER — Encounter (HOSPITAL_BASED_OUTPATIENT_CLINIC_OR_DEPARTMENT_OTHER): Payer: Medicare HMO

## 2016-08-31 ENCOUNTER — Encounter (HOSPITAL_COMMUNITY): Payer: Self-pay

## 2016-08-31 VITALS — BP 146/51 | HR 53 | Temp 98.3°F | Resp 18 | Wt 260.2 lb

## 2016-08-31 DIAGNOSIS — C569 Malignant neoplasm of unspecified ovary: Secondary | ICD-10-CM

## 2016-08-31 DIAGNOSIS — D696 Thrombocytopenia, unspecified: Secondary | ICD-10-CM

## 2016-08-31 DIAGNOSIS — Z5111 Encounter for antineoplastic chemotherapy: Secondary | ICD-10-CM

## 2016-08-31 DIAGNOSIS — C541 Malignant neoplasm of endometrium: Secondary | ICD-10-CM | POA: Diagnosis not present

## 2016-08-31 LAB — DIFFERENTIAL
BASOS ABS: 0 10*3/uL (ref 0.0–0.1)
BASOS PCT: 0 %
Eosinophils Absolute: 0 10*3/uL (ref 0.0–0.7)
Eosinophils Relative: 1 %
LYMPHS PCT: 44 %
Lymphs Abs: 1.4 10*3/uL (ref 0.7–4.0)
MONOS PCT: 15 %
Monocytes Absolute: 0.5 10*3/uL (ref 0.1–1.0)
NEUTROS ABS: 1.3 10*3/uL — AB (ref 1.7–7.7)
Neutrophils Relative %: 40 %

## 2016-08-31 LAB — COMPREHENSIVE METABOLIC PANEL
ALBUMIN: 3.1 g/dL — AB (ref 3.5–5.0)
ALT: 17 U/L (ref 14–54)
ANION GAP: 8 (ref 5–15)
AST: 31 U/L (ref 15–41)
Alkaline Phosphatase: 70 U/L (ref 38–126)
BILIRUBIN TOTAL: 0.3 mg/dL (ref 0.3–1.2)
BUN: 14 mg/dL (ref 6–20)
CHLORIDE: 101 mmol/L (ref 101–111)
CO2: 27 mmol/L (ref 22–32)
Calcium: 9 mg/dL (ref 8.9–10.3)
Creatinine, Ser: 1.17 mg/dL — ABNORMAL HIGH (ref 0.44–1.00)
GFR calc Af Amer: 55 mL/min — ABNORMAL LOW (ref >60)
GFR calc non Af Amer: 47 mL/min — ABNORMAL LOW (ref >60)
GLUCOSE: 181 mg/dL — AB (ref 65–99)
POTASSIUM: 4 mmol/L (ref 3.5–5.1)
Sodium: 136 mmol/L (ref 135–145)
TOTAL PROTEIN: 6.7 g/dL (ref 6.5–8.1)

## 2016-08-31 LAB — CBC
HEMATOCRIT: 28.8 % — AB (ref 36.0–46.0)
Hemoglobin: 9.6 g/dL — ABNORMAL LOW (ref 12.0–15.0)
MCH: 31.5 pg (ref 26.0–34.0)
MCHC: 33.3 g/dL (ref 30.0–36.0)
MCV: 94.4 fL (ref 78.0–100.0)
PLATELETS: 94 10*3/uL — AB (ref 150–400)
RBC: 3.05 MIL/uL — ABNORMAL LOW (ref 3.87–5.11)
RDW: 14.7 % (ref 11.5–15.5)
WBC: 3.3 10*3/uL — AB (ref 4.0–10.5)

## 2016-08-31 MED ORDER — SODIUM CHLORIDE 0.9% FLUSH
10.0000 mL | INTRAVENOUS | Status: DC | PRN
  Administered 2016-08-31: 10 mL
  Filled 2016-08-31: qty 10

## 2016-08-31 MED ORDER — PALONOSETRON HCL INJECTION 0.25 MG/5ML
INTRAVENOUS | Status: AC
  Filled 2016-08-31: qty 5

## 2016-08-31 MED ORDER — OXYCODONE-ACETAMINOPHEN 10-325 MG PO TABS: 1.0000 | ORAL_TABLET | ORAL | 0 refills | Status: DC | PRN

## 2016-08-31 MED ORDER — DEXAMETHASONE SODIUM PHOSPHATE 10 MG/ML IJ SOLN
10.0000 mg | Freq: Once | INTRAMUSCULAR | Status: AC
  Administered 2016-08-31: 10 mg via INTRAVENOUS

## 2016-08-31 MED ORDER — DEXAMETHASONE SODIUM PHOSPHATE 10 MG/ML IJ SOLN
INTRAMUSCULAR | Status: AC
  Filled 2016-08-31: qty 1

## 2016-08-31 MED ORDER — SODIUM CHLORIDE 0.9 % IV SOLN
Freq: Once | INTRAVENOUS | Status: AC
  Administered 2016-08-31: 10:00:00 via INTRAVENOUS

## 2016-08-31 MED ORDER — SODIUM CHLORIDE 0.9 % IV SOLN
550.0000 mg | Freq: Once | INTRAVENOUS | Status: AC
  Administered 2016-08-31: 550 mg via INTRAVENOUS
  Filled 2016-08-31: qty 55

## 2016-08-31 MED ORDER — HEPARIN SOD (PORK) LOCK FLUSH 100 UNIT/ML IV SOLN
500.0000 [IU] | Freq: Once | INTRAVENOUS | Status: AC | PRN
  Administered 2016-08-31: 500 [IU]
  Filled 2016-08-31: qty 5

## 2016-08-31 MED ORDER — SODIUM CHLORIDE 0.9 % IV SOLN
750.0000 mg/m2 | Freq: Once | INTRAVENOUS | Status: AC
  Administered 2016-08-31: 1748 mg via INTRAVENOUS
  Filled 2016-08-31: qty 20.01

## 2016-08-31 MED ORDER — SODIUM CHLORIDE 0.9 % IV SOLN: 10.0000 mg | Freq: Once | INTRAVENOUS | Status: DC

## 2016-08-31 MED ORDER — PALONOSETRON HCL INJECTION 0.25 MG/5ML
0.2500 mg | Freq: Once | INTRAVENOUS | Status: AC
  Administered 2016-08-31: 0.25 mg via INTRAVENOUS

## 2016-08-31 NOTE — Progress Notes (Signed)
Labs reviewed with Dr Talbert Cage- may proceed with treatment.  Chemotherapy given today per orders. Patient tolerated it well. Vitals stable and discharged home via wheelchair with husband.follow up as scheduled.

## 2016-08-31 NOTE — Patient Instructions (Signed)
[facility] Discharge Instructions for Patients Receiving Chemotherapy   Beginning January 23rd 2017 lab work for the Cancer Center will be done in the  Main lab at [facility] on 1st floor. If you have a lab appointment with the Cancer Center please come in thru the  Main Entrance and check in at the main information desk   Today you received the following chemotherapy agents   To help prevent nausea and vomiting after your treatment, we encourage you to take your nausea medication     If you develop nausea and vomiting, or diarrhea that is not controlled by your medication, call the clinic.  The clinic phone number is [phone]. Office hours are Monday-Friday 8:30am-5:00pm.  BELOW ARE SYMPTOMS THAT SHOULD BE REPORTED IMMEDIATELY:  *FEVER GREATER THAN 101.0 F  *CHILLS WITH OR WITHOUT FEVER  NAUSEA AND VOMITING THAT IS NOT CONTROLLED WITH YOUR NAUSEA MEDICATION  *UNUSUAL SHORTNESS OF BREATH  *UNUSUAL BRUISING OR BLEEDING  TENDERNESS IN MOUTH AND THROAT WITH OR WITHOUT PRESENCE OF ULCERS  *URINARY PROBLEMS  *BOWEL PROBLEMS  UNUSUAL RASH Items with * indicate a potential emergency and should be followed up as soon as possible. If you have an emergency after office hours please contact your primary care physician or go to the nearest emergency department.  Please call the clinic during office hours if you have any questions or concerns.   You may also contact the Patient Navigator at [phone] should you have any questions or need assistance in obtaining follow up care.      Resources For Cancer Patients and their Caregivers ? American Cancer Society: Can assist with transportation, wigs, general needs, runs Look Good Feel Better.        1-888-227-6333 ? Cancer Care: Provides financial assistance, online support groups, medication/co-pay assistance.  1-800-813-HOPE (4673) ? [facility] Assists [place] cancer  patients and their families through emotional , educational and financial support.  336-427-4357 ? [place] DSS Where to apply for food stamps, Medicaid and utility assistance. 336-342-1394 ? RCATS: Transportation to medical appointments. 336-347-2287 ? Social Security Administration: May apply for disability if have a Stage IV cancer. 336-342-7796 1-800-772-1213 ? [place] Aging, Disability and Transit Services: Assists with nutrition, care and transit needs. 336-349-2343         

## 2016-09-01 LAB — CA 125: CA 125: 76.7 U/mL — AB (ref 0.0–38.1)

## 2016-09-07 ENCOUNTER — Encounter (HOSPITAL_COMMUNITY): Payer: Self-pay | Admitting: Adult Health

## 2016-09-07 ENCOUNTER — Encounter (HOSPITAL_COMMUNITY): Payer: Medicare HMO | Attending: Hematology

## 2016-09-07 ENCOUNTER — Other Ambulatory Visit (HOSPITAL_COMMUNITY): Payer: Self-pay | Admitting: Oncology

## 2016-09-07 ENCOUNTER — Encounter (HOSPITAL_BASED_OUTPATIENT_CLINIC_OR_DEPARTMENT_OTHER): Payer: Medicare HMO | Admitting: Adult Health

## 2016-09-07 VITALS — BP 174/62 | HR 108 | Temp 98.7°F | Resp 18 | Ht 69.0 in | Wt 255.5 lb

## 2016-09-07 DIAGNOSIS — Z95828 Presence of other vascular implants and grafts: Secondary | ICD-10-CM | POA: Diagnosis present

## 2016-09-07 DIAGNOSIS — C541 Malignant neoplasm of endometrium: Secondary | ICD-10-CM | POA: Insufficient documentation

## 2016-09-07 DIAGNOSIS — R252 Cramp and spasm: Secondary | ICD-10-CM

## 2016-09-07 DIAGNOSIS — R4182 Altered mental status, unspecified: Secondary | ICD-10-CM | POA: Diagnosis not present

## 2016-09-07 DIAGNOSIS — R69 Illness, unspecified: Secondary | ICD-10-CM | POA: Diagnosis not present

## 2016-09-07 DIAGNOSIS — C569 Malignant neoplasm of unspecified ovary: Secondary | ICD-10-CM | POA: Insufficient documentation

## 2016-09-07 DIAGNOSIS — G40901 Epilepsy, unspecified, not intractable, with status epilepticus: Secondary | ICD-10-CM | POA: Diagnosis not present

## 2016-09-07 DIAGNOSIS — G8918 Other acute postprocedural pain: Secondary | ICD-10-CM

## 2016-09-07 DIAGNOSIS — D696 Thrombocytopenia, unspecified: Secondary | ICD-10-CM | POA: Diagnosis not present

## 2016-09-07 DIAGNOSIS — G620 Drug-induced polyneuropathy: Secondary | ICD-10-CM

## 2016-09-07 DIAGNOSIS — M25572 Pain in left ankle and joints of left foot: Secondary | ICD-10-CM | POA: Diagnosis not present

## 2016-09-07 DIAGNOSIS — J9690 Respiratory failure, unspecified, unspecified whether with hypoxia or hypercapnia: Secondary | ICD-10-CM | POA: Diagnosis not present

## 2016-09-07 DIAGNOSIS — M25542 Pain in joints of left hand: Secondary | ICD-10-CM

## 2016-09-07 DIAGNOSIS — M25541 Pain in joints of right hand: Secondary | ICD-10-CM

## 2016-09-07 DIAGNOSIS — M2550 Pain in unspecified joint: Secondary | ICD-10-CM

## 2016-09-07 DIAGNOSIS — D72819 Decreased white blood cell count, unspecified: Secondary | ICD-10-CM | POA: Diagnosis not present

## 2016-09-07 DIAGNOSIS — R569 Unspecified convulsions: Secondary | ICD-10-CM | POA: Diagnosis not present

## 2016-09-07 DIAGNOSIS — R251 Tremor, unspecified: Secondary | ICD-10-CM | POA: Diagnosis not present

## 2016-09-07 DIAGNOSIS — E119 Type 2 diabetes mellitus without complications: Secondary | ICD-10-CM | POA: Diagnosis not present

## 2016-09-07 DIAGNOSIS — M25571 Pain in right ankle and joints of right foot: Secondary | ICD-10-CM

## 2016-09-07 DIAGNOSIS — D61810 Antineoplastic chemotherapy induced pancytopenia: Secondary | ICD-10-CM | POA: Diagnosis not present

## 2016-09-07 DIAGNOSIS — J9600 Acute respiratory failure, unspecified whether with hypoxia or hypercapnia: Secondary | ICD-10-CM | POA: Diagnosis not present

## 2016-09-07 DIAGNOSIS — D649 Anemia, unspecified: Secondary | ICD-10-CM

## 2016-09-07 DIAGNOSIS — T451X5A Adverse effect of antineoplastic and immunosuppressive drugs, initial encounter: Secondary | ICD-10-CM

## 2016-09-07 DIAGNOSIS — R740 Nonspecific elevation of levels of transaminase and lactic acid dehydrogenase [LDH]: Secondary | ICD-10-CM

## 2016-09-07 LAB — COMPREHENSIVE METABOLIC PANEL
ALT: 46 U/L (ref 14–54)
AST: 58 U/L — ABNORMAL HIGH (ref 15–41)
Albumin: 3 g/dL — ABNORMAL LOW (ref 3.5–5.0)
Alkaline Phosphatase: 80 U/L (ref 38–126)
Anion gap: 7 (ref 5–15)
BUN: 17 mg/dL (ref 6–20)
CALCIUM: 8.7 mg/dL — AB (ref 8.9–10.3)
CHLORIDE: 102 mmol/L (ref 101–111)
CO2: 25 mmol/L (ref 22–32)
CREATININE: 1.18 mg/dL — AB (ref 0.44–1.00)
GFR, EST AFRICAN AMERICAN: 54 mL/min — AB (ref >60)
GFR, EST NON AFRICAN AMERICAN: 47 mL/min — AB (ref >60)
Glucose, Bld: 275 mg/dL — ABNORMAL HIGH (ref 65–99)
Potassium: 4.6 mmol/L (ref 3.5–5.1)
Sodium: 134 mmol/L — ABNORMAL LOW (ref 135–145)
TOTAL PROTEIN: 6.6 g/dL (ref 6.5–8.1)
Total Bilirubin: 0.5 mg/dL (ref 0.3–1.2)

## 2016-09-07 LAB — CBC WITH DIFFERENTIAL/PLATELET
Basophils Absolute: 0 10*3/uL (ref 0.0–0.1)
Basophils Relative: 0 %
EOS PCT: 0 %
Eosinophils Absolute: 0 10*3/uL (ref 0.0–0.7)
HCT: 24.4 % — ABNORMAL LOW (ref 36.0–46.0)
Hemoglobin: 8.4 g/dL — ABNORMAL LOW (ref 12.0–15.0)
LYMPHS ABS: 0.8 10*3/uL (ref 0.7–4.0)
LYMPHS PCT: 54 %
MCH: 31.8 pg (ref 26.0–34.0)
MCHC: 34.4 g/dL (ref 30.0–36.0)
MCV: 92.4 fL (ref 78.0–100.0)
MONO ABS: 0.1 10*3/uL (ref 0.1–1.0)
Monocytes Relative: 4 %
Neutro Abs: 0.7 10*3/uL — ABNORMAL LOW (ref 1.7–7.7)
Neutrophils Relative %: 43 %
PLATELETS: 38 10*3/uL — AB (ref 150–400)
RBC: 2.64 MIL/uL — AB (ref 3.87–5.11)
RDW: 14.1 % (ref 11.5–15.5)
WBC: 1.6 10*3/uL — AB (ref 4.0–10.5)

## 2016-09-07 LAB — PREPARE RBC (CROSSMATCH)

## 2016-09-07 LAB — PHOSPHORUS: PHOSPHORUS: 3.2 mg/dL (ref 2.5–4.6)

## 2016-09-07 LAB — MAGNESIUM: MAGNESIUM: 1.1 mg/dL — AB (ref 1.7–2.4)

## 2016-09-07 MED ORDER — MAGNESIUM SULFATE 4 GM/100ML IV SOLN
4.0000 g | Freq: Once | INTRAVENOUS | Status: AC
  Administered 2016-09-07: 4 g via INTRAVENOUS
  Filled 2016-09-07: qty 100

## 2016-09-07 MED ORDER — SODIUM CHLORIDE 0.9 % IV SOLN
4.0000 g | Freq: Once | INTRAVENOUS | Status: DC
  Filled 2016-09-07: qty 8

## 2016-09-07 MED ORDER — ACETAMINOPHEN 325 MG PO TABS
650.0000 mg | ORAL_TABLET | Freq: Once | ORAL | Status: AC
  Administered 2016-09-07: 650 mg via ORAL

## 2016-09-07 MED ORDER — METHYLPREDNISOLONE SODIUM SUCC 40 MG IJ SOLR
40.0000 mg | Freq: Once | INTRAMUSCULAR | Status: AC
  Administered 2016-09-07: 40 mg via INTRAVENOUS

## 2016-09-07 MED ORDER — ACETAMINOPHEN 325 MG PO TABS
ORAL_TABLET | ORAL | Status: AC
  Filled 2016-09-07: qty 2

## 2016-09-07 MED ORDER — SODIUM CHLORIDE 0.9 % IV SOLN
250.0000 mL | Freq: Once | INTRAVENOUS | Status: AC
  Administered 2016-09-07: 250 mL via INTRAVENOUS

## 2016-09-07 MED ORDER — HEPARIN SOD (PORK) LOCK FLUSH 100 UNIT/ML IV SOLN
500.0000 [IU] | Freq: Every day | INTRAVENOUS | Status: AC | PRN
  Administered 2016-09-07: 500 [IU]

## 2016-09-07 MED ORDER — SODIUM CHLORIDE 0.9 % IV SOLN
INTRAVENOUS | Status: DC
  Administered 2016-09-07: 12:00:00 via INTRAVENOUS

## 2016-09-07 MED ORDER — METHYLPREDNISOLONE SODIUM SUCC 40 MG IJ SOLR
INTRAMUSCULAR | Status: AC
  Filled 2016-09-07: qty 1

## 2016-09-07 NOTE — Progress Notes (Signed)
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 [name] and associates, pathology could not be certain that these were not 2 separate primaries rather than one metastatic process from the uterus to the ovary.      08/15/2013 - 12/06/2013 Chemotherapy    Carboplatin/Taxol with dose reduction of 50% and Taxol due to grade 2+ peripheral neuropathy and gabapentin induced nightmare/hallucinations. 5 out of 6 cycles given with the final cycle being cancelled secondary to significant thrombocytopenia/intolerance.      09/10/2013 - 10/17/2013 Radiation Therapy    5040 cGy delivered by EBRT followed by intravaginal brachytherapy.      04/30/2014 PET scan    PET scan consistent with recurrent disease of either ovarian cancer or endometrial cancer.      06/03/2014 - 07/15/2014 Radiation Therapy    5040 cGy over 28 fractions delivered to the low periaortic area lymph node either metastatic endometrial or metastatic or ovarian cancer.      07/19/2014 Imaging    Imaging concerning for new mesenteric disease and subdiaphragmatic disease.      08/05/2014 PET scan    No evidence of progression of disease.      08/05/2014 Remission    Negative PET scan.      06/11/2016 PET scan    1. Hypermetabolic aortocaval and right common iliac adenopathy, maximum SUV 17.1, compatible with malignancy. 2. Calcified left mesenteric mass has only low-grade activity, maximum SUV 5.3, merit surveillance. 3. There several small calcifications along the omentum which are not currently metabolically  active. 4. Along the scarring in operative site from prior laparotomy there some faintly accentuated metabolic activity which is probably related to the original wound and unlikely to be from tumor. 5. Coronary, aortic arch, and branch vessel atherosclerotic vascular disease.      06/11/2016 Relapse/Recurrence    PET scan with aortocaval & right iliac adenopathy consistent with malignancy.       08/03/2016 -  Chemotherapy    Carboplatin Day 1/Gemzar Days 1&8 every 21 days          HISTORY OF PRESENT ILLNESS:  As per Dr. Donald Pore note on 06/15/2016: "Kathleen Whitehead 66 y.o. female returns for followup of Ovarian Cancer/Endometrial cancer, S/P TAH-BSO by Dr. [name] with unsure pathology regarding one primary with metastatic disease versus two separate primaries followed by Carboplatin/Taxol x 5/6 cycles (last cycle cancelled due to intolerance and thrombocytopenia), followed by EBRT and intravaginal brachytherapy.  She did have a recurrence in October 2015 on PET imaging requiring another treatment with EBRT.  Finishing all therapy on 07/15/2014.  Pre-operative CA 125 was elevated."    INTERVAL HISTORY:  Kathleen Whitehead presents today for consideration of Cycle #2, Day 8 Gemzar. She is accompanied by her husband.   With cycle #1, she experienced significant cytopenias after Day 1 of Carbo/Gemcitabine requiring platelet transfusions. She received 1 unit platelets on 08/17/16 (platelet count <5,000) and 1 unit platelets again on 08/19/16 (platelet count 24,000). She tolerated the transfusions without incident.   She had cycle #2, day 1, on  08/31/16.  Endorses periodic nausea, which is managed with current anti-emetics.  Reports 1 day of flu-like symptoms with joint aches/pains, nausea, and chills about 3 days after her chemotherapy. Denies any fever; highest temp per patient was 98.7. She checks temperature regularly at home after chemotherapy, as directed.    She feels very fatigued and  short of breath today; reports it was difficult for her to get out of the car to come up to the cancer center today because she felt short of breath.  Dyspnea on exertion resolved after short rest period.  She noticed this only this morning.    Pain persists with pain/burning to her hands and feet; she also has hip and back pain.  She has not been able to tell much difference in the addition of Fentanyl 12.5 mcg patch.  Remains on Percocet 1-2 tabs every 4-6 hours around-the-clock.  She has tried Cymbalta, Lyrica, and gabapentin in the past, which were all ineffective.   Reports a "different feeling" in her right hand, where she has worsening numbness to her thumb, middle, 4th, and 5th fingertips.  She has also noted a tremor when trying to move.    Denies cough, SOB, mouth sores, bleeding from gums, falls, melena, blood in stool, dysuria, hematuria, and vaginal bleeding,.     REVIEW OF SYSTEMS:  Review of Systems  Constitutional: Positive for fatigue. Negative for fever.  HENT:  Negative.  Negative for mouth sores and nosebleeds.        Endorses occasional blood-tinged nasal drainage; denies frank epistaxis.   Eyes: Negative.   Respiratory: Negative.  Negative for cough and shortness of breath.   Cardiovascular: Negative.   Gastrointestinal: Positive for abdominal pain (sometimes), diarrhea (only after chemotherapy, then resolves ) and nausea (alleviated with zofran). Negative for blood in stool.       Denies melena   Endocrine: Negative.   Genitourinary: Negative.  Negative for dysuria, hematuria and vaginal bleeding.   Musculoskeletal: Positive for arthralgias.       Denies falls (R) hip and buttock pain.   Skin: Negative.   Neurological: Positive for numbness ( neuropathy in hands, legs and feet. ). Negative for dizziness and headaches.  Hematological: Negative.  Does not bruise/bleed easily.  Psychiatric/Behavioral: Negative.   All other systems reviewed and are  negative.    PAST MEDICAL/SURGICAL HISTORY:  Past Medical History:  Diagnosis Date  . Anemia   . Chemotherapy induced neutropenia ([facility])   . Chemotherapy induced thrombocytopenia   . Diabetes mellitus without complication ([facility])   . Endometrial cancer ([facility])   . Hot flashes   . Hypertension   . Malignant neoplasm ([facility])   . Malignant neoplasm of ovary ([facility]) 04/30/2013  . Obesity   . Ovarian cancer ([facility])   . Pancytopenia ([facility])   . Peripheral artery disease ([facility])   . Peripheral neuropathy ([facility])   . Port-a-cath in place    History reviewed. No pertinent surgical history.   SOCIAL HISTORY:  Social History   Social History  . Marital status: Married    Spouse name: N/A  . Number of children: N/A  . Years of education: N/A   Occupational History  . Not on file.   Social History Main Topics  . Smoking status: Never Smoker  . Smokeless tobacco: Never Used  . Alcohol use No  . Drug use: No  . Sexual activity: Not on file   Other Topics Concern  . Not on file   Social History Narrative  .  No narrative on file    FAMILY HISTORY:  Family History  Problem Relation Age of Onset  . Heart failure Mother   . Stroke Sister   . Cancer Sister   . Diabetes Sister   . Leukemia Sister   . Diabetes Brother     CURRENT MEDICATIONS:  No facility-administered encounter medications on file as of 09/07/2016.    Outpatient Encounter Prescriptions as of 09/07/2016  Medication Sig Note  . aspirin EC 325 MG tablet Take 325 mg by mouth daily.    . cetirizine (ZYRTEC) 10 MG tablet Take 10 mg by mouth daily.    Marland Kitchen glipiZIDE (GLUCOTROL) 10 MG tablet TAKE 1 TABLET BY MOUTH TWICE A DAY   . lidocaine-prilocaine (EMLA) cream Apply a quarter size amount to affected area 1 hour prior to coming to chemotherapy.   Marland Kitchen lisinopril (PRINIVIL,ZESTRIL) 40 MG tablet Take 40 mg by mouth daily.    Marland Kitchen loperamide (IMODIUM) 2 MG capsule Take 2 mg by mouth as needed for diarrhea or loose stools.    . metFORMIN  (GLUCOPHAGE) 1000 MG tablet Take 1,000 mg by mouth 2 (two) times daily with a meal.    . Omega-3 Fatty Acids (FISH OIL) 1000 MG CAPS Take 1,000 mg by mouth daily.    . ondansetron (ZOFRAN) 8 MG tablet Take 1 tablet (8 mg total) by mouth every 8 (eight) hours as needed for nausea or vomiting. 09/08/2016: Spouse states pt takes both  . ondansetron (ZOFRAN-ODT) 4 MG disintegrating tablet Take 4 mg by mouth every 8 (eight) hours as needed for nausea or vomiting.  09/08/2016: Spouse states pt takes both  . oxyCODONE-acetaminophen (PERCOCET) 10-325 MG tablet Take 1 tablet by mouth every 4 (four) hours as needed for pain.   . Polyethylene Glycol 3350 (MIRALAX PO) Take 17 g by mouth daily as needed for moderate constipation.    . prochlorperazine (COMPAZINE) 10 MG tablet Take 1 tablet (10 mg total) by mouth every 6 (six) hours as needed for nausea or vomiting.   . [DISCONTINUED] acetaminophen (TYLENOL) 500 MG tablet Take by mouth.   . [DISCONTINUED] CARBOPLATIN IV Inject into the vein.   . [DISCONTINUED] fentaNYL (DURAGESIC - DOSED MCG/HR) 50 MCG/HR Place 50 mcg onto the skin every 3 (three) days.   . [DISCONTINUED] Gemcitabine HCl (GEMZAR IV) Inject into the vein.   . [DISCONTINUED] fentaNYL (DURAGESIC - DOSED MCG/HR) 12 MCG/HR Place 1 patch (12.5 mcg total) onto the skin every 3 (three) days.      ALLERGIES:  Allergies  Allergen Reactions  . Diphenhydramine Palpitations  . Iodinated Diagnostic Agents Other (See Comments)    Unknown  . Duloxetine Hcl Nausea And Vomiting  . Ibuprofen Other (See Comments)    Makes my bones hurt  . Penicillins Rash       PHYSICAL EXAM:  ECOG Performance status: 2-3 - Symptomatic; requires assistance with ADLs   Vitals:   09/07/16 0930  BP: (!) 174/62  Pulse: (!) 108  Resp: 18  Temp: 98.7 F (37.1 C)   Filed Weights   09/07/16 0930  Weight: 255 lb 8 oz (115.9 kg)    Physical Exam  Constitutional: She is oriented to person, place, and time and  well-developed, well-nourished, and in no distress.  HENT:  Whitehead: Normocephalic and atraumatic.  Mouth/Throat: Oropharynx is clear and moist. No oropharyngeal exudate.  Eyes: Conjunctivae are normal. Pupils are equal, round, and reactive to light. No scleral icterus.  Neck: Normal range of motion. Neck supple.  Cardiovascular: Normal rate, regular rhythm and normal heart sounds.   Pulmonary/Chest: Effort normal and breath sounds normal. No respiratory distress. She has no wheezes. She has no rales.  Abdominal: Soft. Bowel sounds are normal. There is no tenderness. There is no guarding.  Musculoskeletal: Normal range of motion. She exhibits edema (left hand edema (chronic since 2013 after a fracture per patient) ).  1+ pitting edema in bilateral legs/ankles/feet   Lymphadenopathy:    She has no cervical adenopathy.  Neurological: She is alert and oriented to person, place, and time.  Seen seated in wheelchair; gait not observed -Hand grips are equal; there is evidence of intentional right hand tremor.    Skin: Skin is warm and dry. There is pallor.  Psychiatric: Mood, memory, affect and judgment normal.  Nursing note and vitals reviewed.    LABORATORY DATA:  I have reviewed the labs as listed.  CBC    Component Value Date/Time   WBC 4.1 09/08/2016 1206   RBC 2.56 (L) 09/08/2016 1206   HGB 7.6 (L) 09/08/2016 1206   HCT 22.5 (L) 09/08/2016 1206   PLT PENDING 09/08/2016 1206   MCV 87.9 09/08/2016 1206   MCH 29.7 09/08/2016 1206   MCHC 33.8 09/08/2016 1206   RDW 15.6 (H) 09/08/2016 1206   LYMPHSABS 0.8 09/07/2016 1011   MONOABS 0.1 09/07/2016 1011   EOSABS 0.0 09/07/2016 1011   BASOSABS 0.0 09/07/2016 1011   CMP Latest Ref Rng & Units 09/08/2016 09/08/2016 09/07/2016  Glucose 65 - 99 mg/dL 280(H) 397(H) 275(H)  BUN 6 - 20 mg/dL 25(H) 23(H) 17  Creatinine 0.44 - 1.00 mg/dL 1.41(H) 1.49(H) 1.18(H)  Sodium 135 - 145 mmol/L 135 134(L) 134(L)  Potassium 3.5 - 5.1 mmol/L 4.8 5.2(H) 4.6   Chloride 101 - 111 mmol/L 104 104 102  CO2 22 - 32 mmol/L 21(L) 21(L) 25  Calcium 8.9 - 10.3 mg/dL 8.4(L) 8.5(L) 8.7(L)  Total Protein 6.5 - 8.1 g/dL - 5.9(L) 6.6  Total Bilirubin 0.3 - 1.2 mg/dL - 0.7 0.5  Alkaline Phos 38 - 126 U/L - 76 80  AST 15 - 41 U/L - 70(H) 58(H)  ALT 14 - 54 U/L - 54 46    PENDING LABS:    DIAGNOSTIC IMAGING:  I have personally reviewed the radiologic reports and agree with below results.   PET scan: 06/11/16 CLINICAL DATA:  Subsequent Treatment strategy for uterine and ovarian cancer with increasing tumor markers appear.  EXAM: NUCLEAR MEDICINE PET SKULL BASE TO THIGH  TECHNIQUE: 12.3 mCi F-18 FDG was injected intravenously. Full-ring PET imaging was performed from the skull base to thigh after the radiotracer. CT data was obtained and used for attenuation correction and anatomic localization.  FASTING BLOOD GLUCOSE:  Value: 86 mg/dl  COMPARISON:  Multiple exams, including CT abdomen and pelvis dated 07/19/2014  FINDINGS: NECK  No hypermetabolic lymph nodes in the neck.  CHEST  No hypermetabolic mediastinal or hilar nodes. No suspicious pulmonary nodules on the CT data. Coronary, aortic arch, and branch vessel atherosclerotic vascular disease.  ABDOMEN/PELVIS  No abnormal hypermetabolic activity within the liver, pancreas, adrenal glands, or spleen. Clustered right periaortic lymph nodes below the level of the renal veins, the primarily posterior to the IVC, measuring up to about 1.3 cm in short axis on image 130/4, maximum SUV 17.1. Abnormal hypermetabolic nodes extend into the right common iliac chain, index right iliac node 1.1 cm in short axis on image 143/4. These nodes are increased compared to 07/19/14.  Calcified  left mesenteric mass measuring 3.3 by 2.2 cm, metabolic activity is similar to the surrounding small bowel, maximum SUV approximately 5.3  Along the surgical site in the right rectus abdominus there is  some very faintly increased activity, maximum SUV 4.0, probably simply postoperative.  Faint curvilinear calcification in the left upper quadrant near the splenic flexure on image 122/4 is not hypermetabolic and a least 6 mm in diameter.  There some small calcifications along the omentum.  Chronic presacral edema.  Uterus and ovaries absent.  SKELETON  No focal hypermetabolic activity to suggest skeletal metastasis.  IMPRESSION: 1. Hypermetabolic aortocaval and right common iliac adenopathy, maximum SUV 17.1, compatible with malignancy. 2. Calcified left mesenteric mass has only low-grade activity, maximum SUV 5.3, merit surveillance. 3. There several small calcifications along the omentum which are not currently metabolically active. 4. Along the scarring in operative site from prior laparotomy there some faintly accentuated metabolic activity which is probably related to the original wound and unlikely to be from tumor. 5. Coronary, aortic arch, and branch vessel atherosclerotic vascular disease.   Electronically Signed   By: Van Clines M.D.   On: 06/11/2016 13:05      PATHOLOGY:     ASSESSMENT & PLAN:  Ms. Giambattista is a pleasant 66 y.o. 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 She went on to receive adjuvant chemotherapy with Carbo/Taxol x 5 cycles requiring 50% dose reduction d/t peripheral neuropathy; 6th cycle held d/t cytopenias and intolerance; she completed chemotherapy on 12/06/13. She also received intravaginal brachytherapy, which completed on 10/17/13.  Post treatment PET scan in 04/2014 consistent with recurrent disease. She went on to complete external beam radiation to the low periaortic area lymph node 28 fractions, and completing radiation on 07/15/14. She was noted to be in remission on PET imaging on 08/05/14.  Subsequent PET  scan on 06/11/16, revealed hypermetabolic aortocaval and right common iliac adenopathy compatible with malignancy.  She began chemotherapy with Carbo/Gemzar on 07/2016. She returns today for follow-up and consideration for chemotherapy.     Endometrial/Ovarian cancer:  -Ms. Wolfington is here for routine follow-up and consideration for Cycle #2, day 8 (Gemzar alone). Her 1st cycle of Carbo/Gemzar was complicated by severe thrombocytopenia after day 1, requiring platelet transfusion x 2.  -Labs reviewed and are inadequate for chemotherapy with thrombocytopenia and symptomatic anemia; hold Gemzar.  -Return to cancer center next week to reconsider day 8 Gemzar therapy.   Pancytopenia secondary to chemotherapy:  -Platelets 38,000 today. Denies any frank bleeding, aside from occasional blood when blowing her nose. No diffuse epistaxis. No other bleeding noted.   -WBC low at 1.6; ANC 700.  Reinforced neutropenic precautions.  -Hemoglobin decreased at 8.4; her shortness of breath with exertion is likely secondary to anemia.  We will transfuse 2 units PRBCs. She will receive 1 unit today and return for 2nd unit tomorrow, 09/08/16.   Hypomagnesemia with right hand muscle cramps/tremor:  -Right hand muscle cramps/tremor could be secondary to hypomagnesemia.  Serum mag 1.1 today.  -4 gm IV magnesium sulfate given in clinic today.  Will consider adding supplemental po magnesium, if needed next week when she returns to cancer center for further evaluation.  I do not want to worsen her diarrhea symptoms she periodically experiences and worsen any electrolytes imbalances.   Transaminitis, likely secondary to chemotherapy, improved:  -AST mildly elevated at 58; likely secondary to chemotherapy. We will continue to monitor.  Peripheral neuropathy & arthralgias, likely secondary to chemotherapy:  -Her pain is likely secondary to chemotherapy. -Will increase Fentanyl patch to 25 mcg patch every 3 days.  -Previously  unable to tolerate neuropathic pain medications including gabapentin, Lyrica, & Cymbalta.  -Continue Percocet 10/325 for breakthrough pain.   Constipation prevention in the setting of opiate use:  -Reinforced the importance of maintaining an adequate bowel regimen with stool softeners/gentle laxatives like Miralax, as appropriate, given increase in opiate therapy.   Nausea & Diarrhea, likely secondary to chemotherapy:  -Resolved -Continue Zofran and Imodium, as needed.      Dispo:  -Return to cancer center in 1 week for re-consideration of Gemzar chemotherapy to complete cycle #2, if counts adequately recovered.      All questions were answered to patient's stated satisfaction. Encouraged patient to call with any new concerns or questions before her next visit to the cancer center and we can certain see her sooner, if needed.    Plan of care discussed with Dr. Twana First, who agrees with the above aforementioned.    Orders placed this encounter:  Orders Placed This Encounter  Procedures  . Phosphorus  . Magnesium    Mike Craze, NP [place] [phone]

## 2016-09-07 NOTE — Patient Instructions (Signed)
[place] at [facility] Discharge Instructions  RECOMMENDATIONS MADE BY THE CONSULTANT AND ANY TEST RESULTS WILL BE SENT TO YOUR REFERRING PHYSICIAN.  IV magnesium given today One unit of blood given today.  Thank you for choosing [place] at [facility] to provide your oncology and hematology care.  To afford each patient quality time with our provider, please arrive at least 15 minutes before your scheduled appointment time.    If you have a lab appointment with the [facility] please come in thru the  Main Entrance and check in at the main information desk  You need to re-schedule your appointment should you arrive 10 or more minutes late.  We strive to give you quality time with our providers, and arriving late affects you and other patients whose appointments are after yours.  Also, if you no show three or more times for appointments you may be dismissed from the clinic at the providers discretion.     Again, thank you for choosing [facility].  Our hope is that these requests will decrease the amount of time that you wait before being seen by our physicians.       _____________________________________________________________  Should you have questions after your visit to [facility], please contact our office at [phone] between the hours of 8:30 a.m. and 4:30 p.m.  Voicemails left after 4:30 p.m. will not be returned until the following business day.  For prescription refill requests, have your pharmacy contact our office.       Resources For Cancer Patients and their Caregivers ? American Cancer Society: Can assist with transportation, wigs, general needs, runs Look Good Feel Better.        607 790 7272 ? Cancer Care: Provides financial assistance, online support groups, medication/co-pay assistance.  1-800-813-HOPE 910-535-5439) ? [place] Assists [place] cancer  patients and their families through emotional , educational and financial support.  808 299 0971 ? [place] DSS Where to apply for food stamps, Medicaid and utility assistance. 571 304 4736 ? [facility]: Transportation to medical appointments. 2484619219 ? Social Security Administration: May apply for disability if have a Stage IV cancer. (334)503-3397 657-783-3162 ? [employer], Disability and Transit Services: Assists with nutrition, care and transit needs. [facility] Support Programs: @10RELATIVEDAYS @ > Cancer Support Group  2nd Tuesday of the month 1pm-2pm, Journey Room  > Creative Journey  3rd Tuesday of the month 1130am-1pm, Journey Room  > Look Good Feel Better  1st Wednesday of the month 10am-12 noon, Journey Room (Call [facility] to register [phone])

## 2016-09-07 NOTE — Progress Notes (Signed)
Labs reviewed, treatment held today per MD. Will give IV magnesium and blood today as ordered.  One unit of blood given today, another unit scheduled for tomorrow. Patient tolerated it well, no issues. Vitals stable and discharged home via wheelchair with husband.Follow up as scheduled.

## 2016-09-08 ENCOUNTER — Inpatient Hospital Stay (HOSPITAL_COMMUNITY): Payer: Medicare HMO

## 2016-09-08 ENCOUNTER — Inpatient Hospital Stay (HOSPITAL_COMMUNITY)
Admission: AD | Admit: 2016-09-08 | Discharge: 2016-09-12 | DRG: 100 | Disposition: A | Discharge status: Home Health | Payer: Medicare HMO | Source: Other Acute Inpatient Hospital | Attending: Internal Medicine | Admitting: Internal Medicine

## 2016-09-08 ENCOUNTER — Encounter (HOSPITAL_COMMUNITY): Payer: Medicare HMO

## 2016-09-08 DIAGNOSIS — E1165 Type 2 diabetes mellitus with hyperglycemia: Secondary | ICD-10-CM | POA: Diagnosis not present

## 2016-09-08 DIAGNOSIS — Z6841 Body Mass Index (BMI) 40.0 and over, adult: Secondary | ICD-10-CM

## 2016-09-08 DIAGNOSIS — E1142 Type 2 diabetes mellitus with diabetic polyneuropathy: Secondary | ICD-10-CM | POA: Diagnosis not present

## 2016-09-08 DIAGNOSIS — C55 Malignant neoplasm of uterus, part unspecified: Secondary | ICD-10-CM | POA: Diagnosis not present

## 2016-09-08 DIAGNOSIS — T451X5A Adverse effect of antineoplastic and immunosuppressive drugs, initial encounter: Secondary | ICD-10-CM | POA: Diagnosis present

## 2016-09-08 DIAGNOSIS — Z823 Family history of stroke: Secondary | ICD-10-CM | POA: Diagnosis not present

## 2016-09-08 DIAGNOSIS — I10 Essential (primary) hypertension: Secondary | ICD-10-CM | POA: Diagnosis not present

## 2016-09-08 DIAGNOSIS — Z806 Family history of leukemia: Secondary | ICD-10-CM | POA: Diagnosis not present

## 2016-09-08 DIAGNOSIS — G40901 Epilepsy, unspecified, not intractable, with status epilepticus: Principal | ICD-10-CM | POA: Diagnosis present

## 2016-09-08 DIAGNOSIS — Z8249 Family history of ischemic heart disease and other diseases of the circulatory system: Secondary | ICD-10-CM | POA: Diagnosis not present

## 2016-09-08 DIAGNOSIS — G9340 Encephalopathy, unspecified: Secondary | ICD-10-CM | POA: Diagnosis not present

## 2016-09-08 DIAGNOSIS — E872 Acidosis: Secondary | ICD-10-CM

## 2016-09-08 DIAGNOSIS — R569 Unspecified convulsions: Secondary | ICD-10-CM | POA: Diagnosis not present

## 2016-09-08 DIAGNOSIS — W19XXXA Unspecified fall, initial encounter: Secondary | ICD-10-CM | POA: Diagnosis not present

## 2016-09-08 DIAGNOSIS — Z01818 Encounter for other preprocedural examination: Secondary | ICD-10-CM

## 2016-09-08 DIAGNOSIS — Z91041 Radiographic dye allergy status: Secondary | ICD-10-CM | POA: Diagnosis not present

## 2016-09-08 DIAGNOSIS — R4182 Altered mental status, unspecified: Secondary | ICD-10-CM

## 2016-09-08 DIAGNOSIS — E861 Hypovolemia: Secondary | ICD-10-CM | POA: Diagnosis present

## 2016-09-08 DIAGNOSIS — R0600 Dyspnea, unspecified: Secondary | ICD-10-CM

## 2016-09-08 DIAGNOSIS — J9601 Acute respiratory failure with hypoxia: Secondary | ICD-10-CM | POA: Diagnosis not present

## 2016-09-08 DIAGNOSIS — Z833 Family history of diabetes mellitus: Secondary | ICD-10-CM

## 2016-09-08 DIAGNOSIS — J811 Chronic pulmonary edema: Secondary | ICD-10-CM | POA: Diagnosis present

## 2016-09-08 DIAGNOSIS — E1151 Type 2 diabetes mellitus with diabetic peripheral angiopathy without gangrene: Secondary | ICD-10-CM | POA: Diagnosis present

## 2016-09-08 DIAGNOSIS — Z4659 Encounter for fitting and adjustment of other gastrointestinal appliance and device: Secondary | ICD-10-CM

## 2016-09-08 DIAGNOSIS — C799 Secondary malignant neoplasm of unspecified site: Secondary | ICD-10-CM

## 2016-09-08 DIAGNOSIS — E860 Dehydration: Secondary | ICD-10-CM | POA: Diagnosis present

## 2016-09-08 DIAGNOSIS — C541 Malignant neoplasm of endometrium: Secondary | ICD-10-CM | POA: Diagnosis not present

## 2016-09-08 DIAGNOSIS — E8770 Fluid overload, unspecified: Secondary | ICD-10-CM | POA: Diagnosis present

## 2016-09-08 DIAGNOSIS — S0083XA Contusion of other part of head, initial encounter: Secondary | ICD-10-CM | POA: Diagnosis present

## 2016-09-08 DIAGNOSIS — D61810 Antineoplastic chemotherapy induced pancytopenia: Secondary | ICD-10-CM | POA: Diagnosis present

## 2016-09-08 DIAGNOSIS — N179 Acute kidney failure, unspecified: Secondary | ICD-10-CM | POA: Diagnosis not present

## 2016-09-08 DIAGNOSIS — E669 Obesity, unspecified: Secondary | ICD-10-CM | POA: Diagnosis present

## 2016-09-08 DIAGNOSIS — C569 Malignant neoplasm of unspecified ovary: Secondary | ICD-10-CM | POA: Diagnosis not present

## 2016-09-08 DIAGNOSIS — J9690 Respiratory failure, unspecified, unspecified whether with hypoxia or hypercapnia: Secondary | ICD-10-CM

## 2016-09-08 DIAGNOSIS — D62 Acute posthemorrhagic anemia: Secondary | ICD-10-CM

## 2016-09-08 DIAGNOSIS — J9600 Acute respiratory failure, unspecified whether with hypoxia or hypercapnia: Secondary | ICD-10-CM

## 2016-09-08 DIAGNOSIS — S0990XA Unspecified injury of head, initial encounter: Secondary | ICD-10-CM | POA: Diagnosis not present

## 2016-09-08 DIAGNOSIS — Z4682 Encounter for fitting and adjustment of non-vascular catheter: Secondary | ICD-10-CM | POA: Diagnosis not present

## 2016-09-08 HISTORY — DX: Unspecified convulsions: R56.9

## 2016-09-08 LAB — PROCALCITONIN: PROCALCITONIN: 0.76 ng/mL

## 2016-09-08 LAB — BLOOD GAS, ARTERIAL
ACID-BASE DEFICIT: 3.3 mmol/L — AB (ref 0.0–2.0)
Acid-base deficit: 4.8 mmol/L — ABNORMAL HIGH (ref 0.0–2.0)
Acid-base deficit: 3.5 mmol/L — ABNORMAL HIGH (ref 0.0–2.0)
BICARBONATE: 19.5 mmol/L — AB (ref 20.0–28.0)
BICARBONATE: 20.8 mmol/L (ref 20.0–28.0)
Bicarbonate: 20.7 mmol/L (ref 20.0–28.0)
Drawn by: 41977
Drawn by: 41977
FIO2: 70
FIO2: 0.4
FIO2: 40
LHR: 14 {breaths}/min
MECHVT: 530 mL
O2 SAT: 99.2 %
O2 Saturation: 98.7 %
O2 Saturation: 98.7 %
PATIENT TEMPERATURE: 98.6
PATIENT TEMPERATURE: 98.6
PATIENT TEMPERATURE: 98.7
PCO2 ART: 34.6 mmHg (ref 32.0–48.0)
PCO2 ART: 35.6 mmHg (ref 32.0–48.0)
PCO2 ART: 34.6 mmHg (ref 32.0–48.0)
PEEP: 5 cmH2O
PEEP: 5 cmH2O
PEEP: 5 cmH2O
PH ART: 7.37 (ref 7.350–7.450)
PH ART: 7.396 (ref 7.350–7.450)
PO2 ART: 178 mmHg — AB (ref 83.0–108.0)
Pressure support: 5 cmH2O
RATE: 14 resp/min
VT: 530 mL
pH, Arterial: 7.382 (ref 7.350–7.450)
pO2, Arterial: 341 mmHg — ABNORMAL HIGH (ref 83.0–108.0)
pO2, Arterial: 182 mmHg — ABNORMAL HIGH (ref 83.0–108.0)

## 2016-09-08 LAB — GLUCOSE, CAPILLARY
GLUCOSE-CAPILLARY: 387 mg/dL — AB (ref 65–99)
GLUCOSE-CAPILLARY: 361 mg/dL — AB (ref 65–99)
GLUCOSE-CAPILLARY: 356 mg/dL — AB (ref 65–99)
GLUCOSE-CAPILLARY: 271 mg/dL — AB (ref 65–99)
GLUCOSE-CAPILLARY: 255 mg/dL — AB (ref 65–99)
GLUCOSE-CAPILLARY: 145 mg/dL — AB (ref 65–99)
GLUCOSE-CAPILLARY: 140 mg/dL — AB (ref 65–99)
GLUCOSE-CAPILLARY: 145 mg/dL — AB (ref 65–99)
GLUCOSE-CAPILLARY: 136 mg/dL — AB (ref 65–99)
GLUCOSE-CAPILLARY: 133 mg/dL — AB (ref 65–99)
GLUCOSE-CAPILLARY: 154 mg/dL — AB (ref 65–99)
Glucose-Capillary: 375 mg/dL — ABNORMAL HIGH (ref 65–99)
Glucose-Capillary: 195 mg/dL — ABNORMAL HIGH (ref 65–99)
Glucose-Capillary: 184 mg/dL — ABNORMAL HIGH (ref 65–99)
Glucose-Capillary: 140 mg/dL — ABNORMAL HIGH (ref 65–99)
Glucose-Capillary: 128 mg/dL — ABNORMAL HIGH (ref 65–99)
Glucose-Capillary: 136 mg/dL — ABNORMAL HIGH (ref 65–99)
Glucose-Capillary: 147 mg/dL — ABNORMAL HIGH (ref 65–99)
Glucose-Capillary: 172 mg/dL — ABNORMAL HIGH (ref 65–99)
Glucose-Capillary: 192 mg/dL — ABNORMAL HIGH (ref 65–99)

## 2016-09-08 LAB — CBC
HCT: 24.2 % — ABNORMAL LOW (ref 36.0–46.0)
HCT: 22.5 % — ABNORMAL LOW (ref 36.0–46.0)
Hemoglobin: 8.2 g/dL — ABNORMAL LOW (ref 12.0–15.0)
Hemoglobin: 7.6 g/dL — ABNORMAL LOW (ref 12.0–15.0)
MCH: 29.7 pg (ref 26.0–34.0)
MCH: 29.7 pg (ref 26.0–34.0)
MCHC: 33.9 g/dL (ref 30.0–36.0)
MCHC: 33.8 g/dL (ref 30.0–36.0)
MCV: 87.7 fL (ref 78.0–100.0)
MCV: 87.9 fL (ref 78.0–100.0)
PLATELETS: 30 10*3/uL — AB (ref 150–400)
PLATELETS: 26 10*3/uL — AB (ref 150–400)
RBC: 2.76 MIL/uL — ABNORMAL LOW (ref 3.87–5.11)
RBC: 2.56 MIL/uL — ABNORMAL LOW (ref 3.87–5.11)
RDW: 15.7 % — ABNORMAL HIGH (ref 11.5–15.5)
RDW: 15.6 % — AB (ref 11.5–15.5)
WBC: 2.7 10*3/uL — AB (ref 4.0–10.5)
WBC: 4.1 10*3/uL (ref 4.0–10.5)

## 2016-09-08 LAB — URINALYSIS, MICROSCOPIC (REFLEX)

## 2016-09-08 LAB — URINALYSIS, ROUTINE W REFLEX MICROSCOPIC
BILIRUBIN URINE: NEGATIVE
GLUCOSE, UA: NEGATIVE mg/dL
Glucose, UA: >=500 mg/dL — AB
KETONES UR: NEGATIVE mg/dL
KETONES UR: NEGATIVE mg/dL
LEUKOCYTES UA: NEGATIVE
NITRITE: NEGATIVE
Nitrite: POSITIVE — AB
PH: 5 (ref 5.0–8.0)
Protein, ur: NEGATIVE mg/dL
Protein, ur: 100 mg/dL — AB
SPECIFIC GRAVITY, URINE: 1.029 (ref 1.005–1.030)
Specific Gravity, Urine: 1.03 (ref 1.005–1.030)
Squamous Epithelial / LPF: NONE SEEN
pH: 5 (ref 5.0–8.0)

## 2016-09-08 LAB — COMPREHENSIVE METABOLIC PANEL
ALT: 54 U/L (ref 14–54)
ANION GAP: 9 (ref 5–15)
AST: 70 U/L — AB (ref 15–41)
Albumin: 2.6 g/dL — ABNORMAL LOW (ref 3.5–5.0)
Alkaline Phosphatase: 76 U/L (ref 38–126)
BUN: 23 mg/dL — ABNORMAL HIGH (ref 6–20)
CHLORIDE: 104 mmol/L (ref 101–111)
CO2: 21 mmol/L — ABNORMAL LOW (ref 22–32)
Calcium: 8.5 mg/dL — ABNORMAL LOW (ref 8.9–10.3)
Creatinine, Ser: 1.49 mg/dL — ABNORMAL HIGH (ref 0.44–1.00)
GFR, EST AFRICAN AMERICAN: 41 mL/min — AB (ref >60)
GFR, EST NON AFRICAN AMERICAN: 35 mL/min — AB (ref >60)
Glucose, Bld: 397 mg/dL — ABNORMAL HIGH (ref 65–99)
Potassium: 5.2 mmol/L — ABNORMAL HIGH (ref 3.5–5.1)
Sodium: 134 mmol/L — ABNORMAL LOW (ref 135–145)
Total Bilirubin: 0.7 mg/dL (ref 0.3–1.2)
Total Protein: 5.9 g/dL — ABNORMAL LOW (ref 6.5–8.1)

## 2016-09-08 LAB — BASIC METABOLIC PANEL
Anion gap: 10 (ref 5–15)
BUN: 25 mg/dL — AB (ref 6–20)
CALCIUM: 8.4 mg/dL — AB (ref 8.9–10.3)
CO2: 21 mmol/L — ABNORMAL LOW (ref 22–32)
CREATININE: 1.41 mg/dL — AB (ref 0.44–1.00)
Chloride: 104 mmol/L (ref 101–111)
GFR, EST AFRICAN AMERICAN: 44 mL/min — AB (ref >60)
GFR, EST NON AFRICAN AMERICAN: 38 mL/min — AB (ref >60)
Glucose, Bld: 280 mg/dL — ABNORMAL HIGH (ref 65–99)
Potassium: 4.8 mmol/L (ref 3.5–5.1)
SODIUM: 135 mmol/L (ref 135–145)

## 2016-09-08 LAB — MAGNESIUM: MAGNESIUM: 2.2 mg/dL (ref 1.7–2.4)

## 2016-09-08 LAB — SODIUM, URINE, RANDOM: SODIUM UR: 69 mmol/L

## 2016-09-08 LAB — PHOSPHORUS: Phosphorus: 2.7 mg/dL (ref 2.5–4.6)

## 2016-09-08 LAB — PROTIME-INR
INR: 1.05
PROTHROMBIN TIME: 13.7 s (ref 11.4–15.2)

## 2016-09-08 LAB — ABO/RH: ABO/RH(D): A POS

## 2016-09-08 LAB — LACTIC ACID, PLASMA
Lactic Acid, Venous: 2.9 mmol/L (ref 0.5–1.9)
Lactic Acid, Venous: 2.6 mmol/L (ref 0.5–1.9)

## 2016-09-08 LAB — CORTISOL: CORTISOL PLASMA: 14.2 ug/dL

## 2016-09-08 LAB — OSMOLALITY, URINE: Osmolality, Ur: 1197 mOsm/kg — ABNORMAL HIGH (ref 300–900)

## 2016-09-08 LAB — BRAIN NATRIURETIC PEPTIDE: B NATRIURETIC PEPTIDE 5: 88.4 pg/mL (ref 0.0–100.0)

## 2016-09-08 LAB — APTT: APTT: 26 s (ref 24–36)

## 2016-09-08 LAB — MRSA PCR SCREENING: MRSA by PCR: NEGATIVE

## 2016-09-08 MED ORDER — PROPOFOL 1000 MG/100ML IV EMUL
5.0000 ug/kg/min | INTRAVENOUS | Status: DC
  Administered 2016-09-08: 40 ug/kg/min via INTRAVENOUS
  Administered 2016-09-08: 25 ug/kg/min via INTRAVENOUS
  Administered 2016-09-08: 50 ug/kg/min via INTRAVENOUS
  Administered 2016-09-08: 30 ug/kg/min via INTRAVENOUS
  Administered 2016-09-08: 45 ug/kg/min via INTRAVENOUS
  Administered 2016-09-08: 50 ug/kg/min via INTRAVENOUS
  Administered 2016-09-09: 25 ug/kg/min via INTRAVENOUS
  Administered 2016-09-09: 40 ug/kg/min via INTRAVENOUS
  Administered 2016-09-09: 35 ug/kg/min via INTRAVENOUS
  Filled 2016-09-08 (×4): qty 100
  Filled 2016-09-08: qty 200
  Filled 2016-09-08 (×2): qty 100

## 2016-09-08 MED ORDER — CHLORHEXIDINE GLUCONATE 0.12% ORAL RINSE (MEDLINE KIT)
15.0000 mL | Freq: Two times a day (BID) | OROMUCOSAL | Status: DC
  Administered 2016-09-08 – 2016-09-09 (×3): 15 mL via OROMUCOSAL

## 2016-09-08 MED ORDER — INSULIN ASPART 100 UNIT/ML ~~LOC~~ SOLN: 2.0000 [IU] | SUBCUTANEOUS | Status: DC

## 2016-09-08 MED ORDER — CHLORHEXIDINE GLUCONATE CLOTH 2 % EX PADS
6.0000 | MEDICATED_PAD | Freq: Every day | CUTANEOUS | Status: DC
  Administered 2016-09-08 – 2016-09-11 (×4): 6 via TOPICAL

## 2016-09-08 MED ORDER — INSULIN GLARGINE 100 UNIT/ML ~~LOC~~ SOLN
10.0000 [IU] | SUBCUTANEOUS | Status: DC
  Administered 2016-09-08 – 2016-09-11 (×3): 10 [IU] via SUBCUTANEOUS
  Filled 2016-09-08 (×5): qty 0.1

## 2016-09-08 MED ORDER — SODIUM CHLORIDE 0.9 % IV SOLN
Freq: Once | INTRAVENOUS | Status: AC
  Administered 2016-09-08: 16:00:00 via INTRAVENOUS

## 2016-09-08 MED ORDER — ORAL CARE MOUTH RINSE
15.0000 mL | OROMUCOSAL | Status: DC
  Administered 2016-09-08 – 2016-09-09 (×8): 15 mL via OROMUCOSAL

## 2016-09-08 MED ORDER — SODIUM CHLORIDE 0.9 % IV SOLN
INTRAVENOUS | Status: DC
Start: 2016-09-08 — End: 2016-09-11
  Administered 2016-09-08 – 2016-09-11 (×5): via INTRAVENOUS

## 2016-09-08 MED ORDER — SODIUM CHLORIDE 0.9 % IV SOLN: 250.0000 mL | INTRAVENOUS | Status: DC | PRN

## 2016-09-08 MED ORDER — PROPOFOL 500 MG/50ML IV EMUL
INTRAVENOUS | Status: AC
  Filled 2016-09-08: qty 50

## 2016-09-08 MED ORDER — PANTOPRAZOLE SODIUM 40 MG IV SOLR
40.0000 mg | Freq: Every day | INTRAVENOUS | Status: DC
  Administered 2016-09-08 – 2016-09-10 (×3): 40 mg via INTRAVENOUS
  Filled 2016-09-08 (×4): qty 40

## 2016-09-08 MED ORDER — PROPOFOL 1000 MG/100ML IV EMUL
INTRAVENOUS | Status: AC
  Filled 2016-09-08: qty 100

## 2016-09-08 MED ORDER — DEXTROSE 10 % IV SOLN
INTRAVENOUS | Status: DC | PRN
Start: 2016-09-08 — End: 2016-09-08

## 2016-09-08 MED ORDER — SODIUM CHLORIDE 0.9 % IV SOLN
INTRAVENOUS | Status: DC
  Administered 2016-09-08: 3.2 [IU]/h via INTRAVENOUS
  Filled 2016-09-08: qty 2.5

## 2016-09-08 MED ORDER — ORAL CARE MOUTH RINSE: 15.0000 mL | Freq: Four times a day (QID) | OROMUCOSAL | Status: DC

## 2016-09-08 MED ORDER — SODIUM CHLORIDE 0.9 % IV BOLUS (SEPSIS)
500.0000 mL | Freq: Once | INTRAVENOUS | Status: AC
  Administered 2016-09-08: 500 mL via INTRAVENOUS

## 2016-09-08 MED ORDER — FENTANYL 2500MCG IN NS 250ML (10MCG/ML) PREMIX INFUSION
25.0000 ug/h | INTRAVENOUS | Status: DC
  Administered 2016-09-08: 50 ug/h via INTRAVENOUS
  Filled 2016-09-08: qty 250

## 2016-09-08 MED ORDER — HEPARIN SODIUM (PORCINE) 5000 UNIT/ML IJ SOLN
5000.0000 [IU] | Freq: Three times a day (TID) | INTRAMUSCULAR | Status: DC
  Administered 2016-09-08: 5000 [IU] via SUBCUTANEOUS
  Filled 2016-09-08: qty 1

## 2016-09-08 MED ORDER — SODIUM CHLORIDE 0.9% FLUSH
10.0000 mL | INTRAVENOUS | Status: DC | PRN
  Administered 2016-09-12: 10 mL
  Filled 2016-09-08: qty 40

## 2016-09-08 MED ORDER — SODIUM CHLORIDE 0.9 % IV SOLN
500.0000 mg | Freq: Two times a day (BID) | INTRAVENOUS | Status: DC
  Administered 2016-09-08 – 2016-09-12 (×9): 500 mg via INTRAVENOUS
  Filled 2016-09-08 (×11): qty 5

## 2016-09-08 NOTE — Progress Notes (Signed)
CRITICAL VALUE ALERT  Critical value received: Lactic Acid 2.9  Date of notification:  09/08/2016  Time of notification:  0338  Critical value read back:Yes.    Nurse who received alert:  Linford Arnold, RN  Called to [employer]. Orders put in for CBC and basic metabolic panel in the am. Will continue to monitor.

## 2016-09-08 NOTE — Progress Notes (Signed)
Stopped by and visited w/ pt's husband, Juleen China. They've been married for 65 yrs, have a 95-y-o son (who was here last night) and 13 y-o granddaughter.They live 50 miles away. Juleen China has already been home and back once, and has also contacted their regular church/pastor. He said (teary-eyed) that pt has had health problems since 2004, and the chemo was especially  rough on her. Provided emotional/spiritual support and prayer -- which he really appreciated. Chaplain available for f/u.   09/08/16 1200  Clinical Encounter Type  Visited With Patient and family together  Visit Type Initial;Psychological support;Spiritual support;Social support;Critical Care  Referral From Chaplain  Spiritual Encounters  Spiritual Needs Prayer;Emotional  Stress Factors  Patient Stress Factors Health changes;Loss of control  Family Stress Factors Family relationships;Health changes;Loss of control   Gerrit Heck, Chaplain

## 2016-09-08 NOTE — Progress Notes (Signed)
eLink Physician-Brief Progress Note Patient Name: Kathleen Whitehead DOB: 08-05-1950 MRN: [id number]   Date of Service  09/08/2016  HPI/Events of Note  Pt arrived on unit, currently on propofol, appears hemodynamically stable, on 70% on vent.   eICU Interventions  Will check CXR, order stat labs, SSI.      Intervention Category Major Interventions: Airway management  Laverle Hobby 09/08/2016, 1:39 AM

## 2016-09-08 NOTE — Progress Notes (Signed)
200cc of fentanyl gtt wasted into med room sink with Almedia Balls RN.

## 2016-09-08 NOTE — Consult Note (Addendum)
NEURO HOSPITALIST CONSULT NOTE   Requestig physician: Dr. Nelda Marseille  Reason for Consult: New onset seizures in the context of severe hyperglycemia  History obtained from:  Husband and Chart    HPI:                                                                                                                                          Kathleen Whitehead is an 66 y.o. female with a past history of ovarian cancer on chemotherapy (has received one treatment) who had a seizure at home yesterday and 3 subsequent seizures in the ED at OSH. She has had recent complications from chemo of pancytopenia, so planned second treatment was held on 3/6. She was transfused one unit PRBC and sent home. While using the commode later yesterday evening, her husband heard her fall to the ground and then saw what he thought was seizure. She was brought to the ED where 3 additional seizures were witnessed by medical staff. Lactate was elevated, also consistent with seizure activity. She was intubated for airway protection, started on propofol and loaded with Keppra at OSH. She was then transferred to [facility] ICU. She was on propofol 50 mcg/kg/min at time of initial Neurology evaluation here at [facility]. No seizure activity while on propofol.   She has no prior history of seizures and no history of stroke. PMHx was reviewed, as listed below.    Past Medical History:  Diagnosis Date  . Anemia   . Chemotherapy induced neutropenia ([place])   . Chemotherapy induced thrombocytopenia   . Diabetes mellitus without complication ([place])   . Endometrial cancer ([place])   . Hot flashes   . Hypertension   . Malignant neoplasm ([place])   . Malignant neoplasm of ovary ([place]) 04/30/2013  . Obesity   . Ovarian cancer ([place])   . Pancytopenia ([place])   . Peripheral artery disease ([place])   . Peripheral neuropathy ([place])   . Port-a-cath in place     No past surgical history on file.  Family History  Problem Relation Age of Onset  .  Heart failure Mother   . Stroke Sister   . Cancer Sister   . Diabetes Sister   . Leukemia Sister   . Diabetes Brother    Social History:  reports that she has never smoked. She has never used smokeless tobacco. She reports that she does not drink alcohol or use drugs.  Allergies  Allergen Reactions  . Diphenhydramine Palpitations  . Iodinated Diagnostic Agents Other (See Comments)  . Duloxetine Hcl Nausea And Vomiting  . Ibuprofen Other (See Comments)    Makes my bones hurt  . Penicillins Rash    MEDICATIONS:  Current Facility-Administered Medications:  .  0.9 %  sodium chloride infusion, 250 mL, Intravenous, PRN, Rush Farmer, MD .  Chlorhexidine Gluconate Cloth 2 % PADS 6 each, 6 each, Topical, Daily, Raylene Miyamoto, MD .  fentaNYL 2518mcg in NS 232mL (47mcg/ml) infusion-PREMIX, 25-400 mcg/hr, Intravenous, Continuous, Rush Farmer, MD, Last Rate: 5 mL/hr at 09/08/16 0235, 50 mcg/hr at 09/08/16 0235 .  heparin injection 5,000 Units, 5,000 Units, Subcutaneous, Q8H, Rush Farmer, MD .  insulin regular (NOVOLIN R,HUMULIN R) 250 Units in sodium chloride 0.9 % 250 mL (1 Units/mL) infusion, , Intravenous, Continuous, Rush Farmer, MD, Last Rate: 5.8 mL/hr at 09/08/16 0401, 5.8 Units/hr at 09/08/16 0401 .  pantoprazole (PROTONIX) injection 40 mg, 40 mg, Intravenous, QHS, Rush Farmer, MD .  propofol (DIPRIVAN) 1000 MG/100ML infusion, 5-70 mcg/kg/min, Intravenous, Continuous, Rush Farmer, MD, Stopped at 09/08/16 0445 .  propofol (DIPRIVAN) 1000 MG/100ML infusion, , , ,  .  sodium chloride flush (NS) 0.9 % injection 10-40 mL, 10-40 mL, Intracatheter, PRN, Raylene Miyamoto, MD   ROS:                                                                                                                                       Unable to obtain due to intubation/sedation.    Blood pressure (!) 124/54, pulse 84, temperature 98.7 F (37.1 C), temperature source Oral, resp. rate (!) 0, height 5\' 6"  (1.676 m), weight 118.6 kg (261 lb 7.5 oz), SpO2 100 %.  General Examination:                                                                                                      HEENT-  Normocephalic/atraumatic.  Lungs- Intubated on ventilator Extremities- Warm and well-perfused  Neurological Examination Initial exam on propofol sedation: Flaccid extremities x 4, no reaction to sensory stimuli, no doll's eye reflex and unreactive pupils.  Subsequent exam after propofol held for 10 minutes: Mental Status: Obtunded. Semipurposeful movement to noxious stimuli.  Cranial Nerves: II, III, IV, VI: Pupils sluggishly reactive. Closes eyes when examiner attempting to hold open. Conjugate EOM with versions away from light stimulus. No nystagmus. Does not fixate. No blink to threat.  V,VII: Furrows brow and closes eyes symmetrically to noxious  VIII: No response to auditory stimuli IX,X: Intubated XI: bilateral shoulder shrug symmetric with coughing XII: Intbubated Motor: Semipurposefully moves upper and lower extremities with 2/5 strength symmetrically in response to noxious stimuli  Deep  Tendon Reflexes: Hypoactive x 4. Toes upgoing bilaterally Cerebellar/Gait: Unable to assess.  Other: No jerking, eyelid twitching, nystagmus, forced eye deviation or posturing to suggest seizure while propofol was held  Lab Results: Basic Metabolic Panel:  Recent Labs Lab 09/07/16 1011 09/08/16 0239  NA 134* 134*  K 4.6 5.2*  CL 102 104  CO2 25 21*  GLUCOSE 275* 397*  BUN 17 23*  CREATININE 1.18* 1.49*  CALCIUM 8.7* 8.5*  MG 1.1* 2.2  PHOS 3.2 2.7    Liver Function Tests:  Recent Labs Lab 09/07/16 1011 09/08/16 0239  AST 58* 70*  ALT 46 54  ALKPHOS 80 76  BILITOT 0.5 0.7  PROT 6.6 5.9*  ALBUMIN 3.0* 2.6*   No results for input(s): LIPASE, AMYLASE in the last 168  hours. No results for input(s): AMMONIA in the last 168 hours.  CBC:  Recent Labs Lab 09/07/16 1011 09/08/16 0239  WBC 1.6* 2.7*  NEUTROABS 0.7*  --   HGB 8.4* 8.2*  HCT 24.4* 24.2*  MCV 92.4 87.7  PLT 38* 30*    Cardiac Enzymes: No results for input(s): CKTOTAL, CKMB, CKMBINDEX, TROPONINI in the last 168 hours.  Lipid Panel: No results for input(s): CHOL, TRIG, HDL, CHOLHDL, VLDL, LDLCALC in the last 168 hours.  CBG:  Recent Labs Lab 09/08/16 0132 09/08/16 0248 09/08/16 0313 09/08/16 0358  GLUCAP 387* 375* 361* 356*    Microbiology: Results for orders placed or performed during the hospital encounter of 09/08/16  MRSA PCR Screening     Status: None   Collection Time: 09/08/16  1:27 AM  Result Value Ref Range Status   MRSA by PCR NEGATIVE NEGATIVE Final    Comment:        The GeneXpert MRSA Assay (FDA approved for NASAL specimens only), is one component of a comprehensive MRSA colonization surveillance program. It is not intended to diagnose MRSA infection nor to guide or monitor treatment for MRSA infections.     Coagulation Studies:  Recent Labs  09/08/16 0239  LABPROT 13.7  INR 1.05    Imaging: Dg Chest Port 1 View  Result Date: 09/08/2016 CLINICAL DATA:  Encounter for orogastric tube placement and endotracheal tube position. EXAM: PORTABLE CHEST 1 VIEW COMPARISON:  None. FINDINGS: The patient is slightly rotated on current exam. Endotracheal tube tip terminates 4.2 cm above the carina in satisfactory position. Gastric tube is seen below the left hemidiaphragm in the expected location of the stomach. There is mild elevation of the right hemidiaphragm. No pulmonary consolidation, effusion or pneumothorax. Port catheter is noted with tip at the cavoatrial junction. No acute nor suspicious osseous lesions. IMPRESSION: Satisfactory support line and tube positions.  Clear lungs. Electronically Signed   By: Ashley Royalty M.D.   On: 09/08/2016 02:20   Dg  Abd Portable 1v  Result Date: 09/08/2016 CLINICAL DATA:  Gastric tube placement EXAM: PORTABLE ABDOMEN - 1 VIEW COMPARISON:  None. FINDINGS: The tip of a gastric tube projects over the region of the duodenum bulb. The side-port is seen in the pre-pyloric region of the stomach. The bowel gas pattern is unremarkable. Mild elevation the right hemidiaphragm. IMPRESSION: The tip of a gastric tube projects over the region of the duodenal bulb with side-port in the pre-pyloric region of the stomach. Electronically Signed   By: Ashley Royalty M.D.   On: 09/08/2016 02:23    Assessment: 66 year old female on chemotherapy for ovarian cancer with new onset seizures and hyperglycemia 1. Low magnesium with  lab draw at 10 AM yesterday. Hypomagnesemia a possible trigger for her seizures. 2. Hyperglycemia in the high 300's earlier today. If levels were higher at home, this could serve as a trigger for her seizures.  3. No clinical seizure activity while off propofol for 10 minutes.   Recommendations: 1. Monitor and correct electrolytes and glucose levels as indicated.  2. EEG in AM.  3. Wean off sedation for repeat Neurological evaluations 4. Continue Keppra at 500 mg IV BID.  35 minutes spent in the Neurological evaluation and management of this critically ill patient  Electronically signed: Dr. Kerney Elbe 09/08/2016, 4:50 AM

## 2016-09-08 NOTE — Progress Notes (Signed)
Bedside EEG completed, results pending. 

## 2016-09-08 NOTE — Progress Notes (Addendum)
PULMONARY / CRITICAL CARE MEDICINE   Name: Kathleen Whitehead MRN: [id number] DOB: [birth year]    ADMISSION DATE:  09/08/2016 CONSULTATION DATE:  3/7  REFERRING MD:  Dr Annamaria Boots EDP  CHIEF COMPLAINT:  Seizure  HISTORY OF PRESENT ILLNESS:   66 year old female with past medical history as below, which is significant for ovarian cancer on current chemotherapy, and diabetes mellitus. So far she has only received one chemotherapy treatment which will serve to become anemic, thrombocytopenic, and leukopenic. For those reasons, which she returned for her second treatment of chemotherapy on 3/6 the treatment was held. She was given a unit of PRBC and was sent home. Later that evening while using the bedside commode her husband heard her fall to the ground and witness what he described as seizure-like activity. She then went on to seize again 3 times in the emergency department and was intubated for airway protection. She was transferred to [facility] for neurology evaluation.    SUBJECTIVE:  Sedated on vent  VITAL SIGNS: BP (!) 109/54   Pulse 80   Temp 98.7 F (37.1 C) (Oral)   Resp 15   Ht 5\' 6"  (1.676 m)   Wt 261 lb 7.5 oz (118.6 kg)   SpO2 100%   BMI 42.20 kg/m   HEMODYNAMICS:    VENTILATOR SETTINGS: Vent Mode: PRVC FiO2 (%):  [40 %-70 %] 40 % Set Rate:  [14 bmp] 14 bmp Vt Set:  [530 mL] 530 mL PEEP:  [5 cmH20] 5 cmH20 Plateau Pressure:  [16 cmH20] 16 cmH20  INTAKE / OUTPUT: I/O last 3 completed shifts: In: 136.5 [I.V.:136.5] Out: 375 [Urine:375]  PHYSICAL EXAMINATION: General:  Morbidly obese female on ventilator Neuro:  Does not follow commands, purposeful movements reaching for ET tube. No nuchal rigidity HEENT:  Normocephalic, ecchymosis to left temporal area has reports this is new since seizure /fall Cardiovascular:  RRR, no MRG Lungs:  Coarse crackles bilaterally Abdomen:  Soft, nondistended, normoactive bowel sounds Musculoskeletal:  No acute deformity.  Bilateral lower extremity 2+ pitting edema Skin:  Grossly intact  LABS:  BMET  Recent Labs Lab 09/07/16 1011 09/08/16 0239 09/08/16 0520  NA 134* 134* 135  K 4.6 5.2* 4.8  CL 102 104 104  CO2 25 21* 21*  BUN 17 23* 25*  CREATININE 1.18* 1.49* 1.41*  GLUCOSE 275* 397* 280*    Electrolytes  Recent Labs Lab 09/07/16 1011 09/08/16 0239 09/08/16 0520  CALCIUM 8.7* 8.5* 8.4*  MG 1.1* 2.2  --   PHOS 3.2 2.7  --     CBC  Recent Labs Lab 09/07/16 1011 09/08/16 0239  WBC 1.6* 2.7*  HGB 8.4* 8.2*  HCT 24.4* 24.2*  PLT 38* 30*    Coag's  Recent Labs Lab 09/08/16 0239  APTT 26  INR 1.05    Sepsis Markers  Recent Labs Lab 09/08/16 0239  LATICACIDVEN 2.9*  PROCALCITON 0.76    ABG  Recent Labs Lab 09/08/16 0141 09/08/16 0422  PHART 7.370 7.382  PCO2ART 34.6 35.6  PO2ART 341* 178*    Liver Enzymes  Recent Labs Lab 09/07/16 1011 09/08/16 0239  AST 58* 70*  ALT 46 54  ALKPHOS 80 76  BILITOT 0.5 0.7  ALBUMIN 3.0* 2.6*    Cardiac Enzymes No results for input(s): TROPONINI, PROBNP in the last 168 hours.  Glucose  Recent Labs Lab 09/08/16 0313 09/08/16 0358 09/08/16 0457 09/08/16 0558 09/08/16 0708 09/08/16 0747  GLUCAP 361* 356* 271* 255* 195* 184*    Imaging  Dg Chest Port 1 View  Result Date: 09/08/2016 CLINICAL DATA:  Encounter for orogastric tube placement and endotracheal tube position. EXAM: PORTABLE CHEST 1 VIEW COMPARISON:  None. FINDINGS: The patient is slightly rotated on current exam. Endotracheal tube tip terminates 4.2 cm above the carina in satisfactory position. Gastric tube is seen below the left hemidiaphragm in the expected location of the stomach. There is mild elevation of the right hemidiaphragm. No pulmonary consolidation, effusion or pneumothorax. Port catheter is noted with tip at the cavoatrial junction. No acute nor suspicious osseous lesions. IMPRESSION: Satisfactory support line and tube positions.  Clear  lungs. Electronically Signed   By: Ashley Royalty M.D.   On: 09/08/2016 02:20   Dg Abd Portable 1v  Result Date: 09/08/2016 CLINICAL DATA:  Gastric tube placement EXAM: PORTABLE ABDOMEN - 1 VIEW COMPARISON:  None. FINDINGS: The tip of a gastric tube projects over the region of the duodenum bulb. The side-port is seen in the pre-pyloric region of the stomach. The bowel gas pattern is unremarkable. Mild elevation the right hemidiaphragm. IMPRESSION: The tip of a gastric tube projects over the region of the duodenal bulb with side-port in the pre-pyloric region of the stomach. Electronically Signed   By: Ashley Royalty M.D.   On: 09/08/2016 02:23     STUDIES:  CT head 3/6 > no acute intracranial abnormality  CULTURES: Blood 3/7 > Urine 3/7 > Tracheal aspirate 3/7 >  ANTIBIOTICS:   SIGNIFICANT EVENTS:   LINES/TUBES: ETT 3/7 >  DISCUSSION: 66 year old female with past history of ovarian cancer on current chemotherapy. Status post 1 treatment. Suffered seizure at home on 3/6 and 3 subsequent seizure in the emergency department prompting intubation for airway protection. She was transferred to [facility] for neurology evaluation.  ASSESSMENT / PLAN:  PULMONARY A: Inability to protect airway due to seizures/post ictal Pulmonary edema  P:   Full vent support Daily SBT ABG CXR VAP bundle  CARDIOVASCULAR A:  Hypertension Volume overlaod  P:  Telemetry monitoring PRN hydralazine to keep SBP < 170 Echo  RENAL Lab Results  Component Value Date   CREATININE 1.41 (H) 09/08/2016   CREATININE 1.49 (H) 09/08/2016   CREATININE 1.18 (H) 09/07/2016    A:   AKI  P:   KVO IVF  GASTROINTESTINAL A:   No acute issues   P:   NPO Protonix for SUP  HEMATOLOGIC  Recent Labs  09/07/16 1011 09/08/16 0239  HGB 8.4* 8.2*    A:   Ovarian Ca (followed [facility]) Anemia Thrombocytopenia Leukopenia post chemo x1  P:  Follow CBC Transfuse PRBC for Hemoglobin  < 7 Consider oncology consul  INFECTIOUS A:   Leukopenia  P:   Pan culture Defer ABX for now Trend WBC and fever curve  ENDOCRINE CBG (last 3)   Recent Labs  09/08/16 0558 09/08/16 0708 09/08/16 0747  GLUCAP 255* 195* 184*     A:   DM HHNK  P:   CBG monitoring and SSI Hold IVF as she is volume overloaded  NEUROLOGIC A:   Seizure / Status epilepticus  P:   RASS goal: 0 Neurology following Given Keppra in ED Propofol and fenatnyl infusions.    FAMILY  - Updates: 3/7 no family at bedside  - Inter-disciplinary family meet or Palliative Care meeting due by:  3/12  App CCT 30 min  Richardson Landry Minor ACNP Maryanna Shape PCCM Pager [phone] till 3 pm If no answer page [phone] 09/08/2016, 7:59 AM  ATTENDING NOTE / ATTESTATION NOTE :   I have discussed the case with the resident/APP  Richardson Landry Minor NP.   I agree with the resident/APP's  history, physical examination, assessment, and plans.    I have edited the above note and modified it according to our agreed history, physical examination, assessment and plan.   Briefly, 66 year old female with ovarian cancer on current chemotherapy, and diabetes mellitus. So far she has only received one chemotherapy treatment which will serve to become anemic, thrombocytopenic, and leukopenic. For those reasons, which she returned for her second treatment of chemotherapy on 3/6 the treatment was held. She was given a unit of PRBC and was sent home. Later that evening while using the bedside commode her husband heard her fall to the ground and witness what he described as seizure-like activity. She then went on to seize again 3 times in the emergency department and was intubated for airway protection. She was transferred to [facility] for [facility]y evaluation.  Pt seen, on going EEG. Intubated, sedated. (-) recurreence of seizure since being admitted.    Vitals:  Vitals:   09/08/16 0802 09/08/16 0900 09/08/16 1000 09/08/16 1201  BP:  137/61 (!) 136/56 (!) 153/68   Pulse: 89 83 90   Resp: 20 15 20 19   Temp:      TempSrc:      SpO2: 100% 100% 100% 100%  Weight:      Height:        Constitutional/General: well-nourished, well-developed, intubated, sedated, not in any distress  Body mass index is 42.2 kg/m. Wt Readings from Last 3 Encounters:  09/08/16 118.6 kg (261 lb 7.5 oz)  09/07/16 115.9 kg (255 lb 8 oz)  08/31/16 118 kg (260 lb 3.2 oz)    HEENT: PERLA, anicteric sclerae. (-) Oral thrush. Intubated, ETT in place  Neck: No masses. Midline trachea. No JVD, (-) LAD. (-) bruits appreciated.  Respiratory/Chest: Grossly normal chest. (-) deformity. (-) Accessory muscle use.  Symmetric expansion. Diminished BS on both lower lung zones. (-) wheezing, crackles, rhonchi (-) egophony  Cardiovascular: Regular rate and  rhythm, heart sounds normal, no murmur or gallops,  Gr 1  peripheral edema  Gastrointestinal:  Normal bowel sounds. Soft, non-tender. No hepatosplenomegaly.  (-) masses.   Musculoskeletal:  Normal muscle tone.   Extremities: Grossly normal. (-) clubbing, cyanosis.  (-) edema  Skin: (-) rash,lesions seen.   Neurological/Psychiatric : sedated, intubated. CN grossly intact. (-) lateralizing signs.     CBC Recent Labs     09/07/16  1011  09/08/16  0239  09/08/16  1206  WBC  1.6*  2.7*  4.1  HGB  8.4*  8.2*  7.6*  HCT  24.4*  24.2*  22.5*  PLT  38*  30*  PENDING    Coag's Recent Labs     09/08/16  0239  APTT  26  INR  1.05    BMET Recent Labs     09/07/16  1011  09/08/16  0239  09/08/16  0520  NA  134*  134*  135  K  4.6  5.2*  4.8  CL  102  104  104  CO2  25  21*  21*  BUN  17  23*  25*  CREATININE  1.18*  1.49*  1.41*  GLUCOSE  275*  397*  280*    Electrolytes Recent Labs     09/07/16  1011  09/08/16  0239  09/08/16  0520  CALCIUM  8.7*  8.5*  8.4*  MG  1.1*  2.2   --   PHOS  3.2  2.7   --     Sepsis Markers Recent Labs     09/08/16  0239   PROCALCITON  0.76    ABG Recent Labs     09/08/16  0141  09/08/16  0422  09/08/16  0828  PHART  7.370  7.382  7.396  PCO2ART  34.6  35.6  34.6  PO2ART  341*  178*  182*    Liver Enzymes Recent Labs     09/07/16  1011  09/08/16  0239  AST  58*  70*  ALT  46  54  ALKPHOS  80  76  BILITOT  0.5  0.7  ALBUMIN  3.0*  2.6*    Cardiac Enzymes No results for input(s): TROPONINI, PROBNP in the last 72 hours.  Glucose Recent Labs     09/08/16  0457  09/08/16  0558  09/08/16  0708  09/08/16  0747  09/08/16  0913  09/08/16  1113  GLUCAP  271*  255*  195*  184*  145*  128*    Imaging Dg Chest Port 1 View  Result Date: 09/08/2016 CLINICAL DATA:  Encounter for orogastric tube placement and endotracheal tube position. EXAM: PORTABLE CHEST 1 VIEW COMPARISON:  None. FINDINGS: The patient is slightly rotated on current exam. Endotracheal tube tip terminates 4.2 cm above the carina in satisfactory position. Gastric tube is seen below the left hemidiaphragm in the expected location of the stomach. There is mild elevation of the right hemidiaphragm. No pulmonary consolidation, effusion or pneumothorax. Port catheter is noted with tip at the cavoatrial junction. No acute nor suspicious osseous lesions. IMPRESSION: Satisfactory support line and tube positions.  Clear lungs. Electronically Signed   By: Ashley Royalty M.D.   On: 09/08/2016 02:20   Dg Abd Portable 1v  Result Date: 09/08/2016 CLINICAL DATA:  Gastric tube placement EXAM: PORTABLE ABDOMEN - 1 VIEW COMPARISON:  None. FINDINGS: The tip of a gastric tube projects over the region of the duodenum bulb. The side-port is seen in the pre-pyloric region of the stomach. The bowel gas pattern is unremarkable. Mild elevation the right hemidiaphragm. IMPRESSION: The tip of a gastric tube projects over the region of the duodenal bulb with side-port in the pre-pyloric region of the stomach. Electronically Signed   By: Ashley Royalty M.D.   On:  09/08/2016 02:23   Assessment/Plan : Seizure, unsure etiology. Could be metabolic in origin as sugar was elevated and Mg was low - Allegedly,  cranial [facility] scan [facility] at [facility] was normal. - Correct electrolytes. Cont insulin drip (not in DKA or HHS though). Wean off insulin drip.  - Neurology following patient. Appreciate recommendations. - patient  is for EEG. - Cont Keppra. - Anticipate, we can  wean and potentially extubate in the morning unless with recurrence of seizures.   Acute hypoxemic respiratory failure secondary to unable to protect airway with seizures. - Cont ventilatory support for now. - Anticipate we can wean and extubate in the morning.   Acute kidney injury, likely hypovolemia. - Cont IV fluids.   Ovarian CA, with recent anemia and low plt with chemotherapy - will transfuse 1 u PRBC - observe for bleeding   I spent  30  minutes of Critical Care time with this patient today. This is my time spent independent of the APP or resident.   Family :  No family at bedside.  Monica Becton, MD 09/08/2016, 12:44 PM  Pulmonary and Critical Care Pager [phone] After 3 pm or if no answer, call [phone]

## 2016-09-08 NOTE — H&P (Signed)
PULMONARY / CRITICAL CARE MEDICINE   Name: Kathleen Whitehead MRN: [id number] DOB: [birth year]    ADMISSION DATE:  09/08/2016 CONSULTATION DATE:  3/7  REFERRING MD:  Dr Annamaria Boots EDP  CHIEF COMPLAINT:  Seizure  HISTORY OF PRESENT ILLNESS:   66 year old female with past medical history as below, which is significant for ovarian cancer on current chemotherapy, and diabetes mellitus. So far she has only received one chemotherapy treatment which will serve to become anemic, thrombocytopenic, and leukopenic. For those reasons, which she returned for her second treatment of chemotherapy on 3/6 the treatment was held. She was given a unit of PRBC and was sent home. Later that evening while using the bedside commode her husband heard her fall to the ground and witness what he described as seizure-like activity. She then went on to seize again 3 times in the emergency department and was intubated for airway protection. She was transferred to [facility] for neurology evaluation.  PAST MEDICAL HISTORY :  She  has a past medical history of Anemia; Chemotherapy induced neutropenia ([place]); Chemotherapy induced thrombocytopenia; Diabetes mellitus without complication ([place]); Endometrial cancer ([place]); Hot flashes; Hypertension; Malignant neoplasm ([place]); Malignant neoplasm of ovary ([place]) (04/30/2013); Obesity; Ovarian cancer ([place]); Pancytopenia ([place]); Peripheral artery disease ([place]); Peripheral neuropathy ([place]); and Port-a-cath in place.  PAST SURGICAL HISTORY: She  has no past surgical history on file.  Allergies  Allergen Reactions  . Diphenhydramine Palpitations  . Iodinated Diagnostic Agents Other (See Comments)  . Duloxetine Hcl Nausea And Vomiting  . Ibuprofen Other (See Comments)    Makes my bones hurt  . Penicillins Rash    No current facility-administered medications on file prior to encounter.    Current Outpatient Prescriptions on File Prior to Encounter  Medication Sig  . acetaminophen  (TYLENOL) 500 MG tablet Take by mouth.  Marland Kitchen aspirin EC 325 MG tablet   . CARBOPLATIN IV Inject into the vein.  . cetirizine (ZYRTEC) 10 MG tablet Take by mouth.  . fentaNYL (DURAGESIC - DOSED MCG/HR) 50 MCG/HR Place 50 mcg onto the skin every 3 (three) days.  . Gemcitabine HCl (GEMZAR IV) Inject into the vein.  Marland Kitchen glipiZIDE (GLUCOTROL) 10 MG tablet TAKE 1 TABLET BY MOUTH TWICE A DAY FOR DIABETES  . lidocaine-prilocaine (EMLA) cream Apply a quarter size amount to affected area 1 hour prior to coming to chemotherapy.  Marland Kitchen lisinopril (PRINIVIL,ZESTRIL) 40 MG tablet Take by mouth.  . loperamide (IMODIUM) 2 MG capsule Take by mouth.  . metFORMIN (GLUCOPHAGE) 1000 MG tablet Take by mouth.  . Omega-3 Fatty Acids (FISH OIL) 1000 MG CAPS Take by mouth.  . ondansetron (ZOFRAN) 8 MG tablet Take 1 tablet (8 mg total) by mouth every 8 (eight) hours as needed for nausea or vomiting.  . ondansetron (ZOFRAN-ODT) 4 MG disintegrating tablet Take by mouth.  . oxyCODONE-acetaminophen (PERCOCET) 10-325 MG tablet Take 1 tablet by mouth every 4 (four) hours as needed for pain.  . polyethylene glycol powder (MIRALAX) powder Take by mouth.  . prochlorperazine (COMPAZINE) 10 MG tablet Take 1 tablet (10 mg total) by mouth every 6 (six) hours as needed for nausea or vomiting.    FAMILY HISTORY:  Her indicated that the status of her mother is unknown. She indicated that the status of her sister is unknown. She indicated that the status of her brother is unknown.    SOCIAL HISTORY: She  reports that she has never smoked. She has never used smokeless tobacco. She reports that she does  not drink alcohol or use drugs.  REVIEW OF SYSTEMS:   Unable as patient is encephalopathic and intuabted  SUBJECTIVE:    VITAL SIGNS: SpO2 100%   HEMODYNAMICS:    VENTILATOR SETTINGS: Vent Mode: PRVC FiO2 (%):  [70 %] 70 % Set Rate:  [14 bmp] 14 bmp Vt Set:  [530 mL] 530 mL PEEP:  [5 cmH20] 5 cmH20 Plateau Pressure:  [16 cmH20]  16 cmH20  INTAKE / OUTPUT: No intake/output data recorded.  PHYSICAL EXAMINATION: General:  Morbidly obese female on ventilator Neuro:  Does not follow commands, purposeful movements reaching for ET tube. No nuchal rigidity HEENT:  Normocephalic, ecchymosis to left temporal area has reports this is new since seizure today. Cardiovascular:  RRR, no MRG Lungs:  Coarse crackles bilaterally Abdomen:  Soft, nondistended, normoactive bowel sounds Musculoskeletal:  No acute deformity. Bilateral lower extremity 2+ pitting edema Skin:  Grossly intact  LABS:  BMET  Recent Labs Lab 09/07/16 1011  NA 134*  K 4.6  CL 102  CO2 25  BUN 17  CREATININE 1.18*  GLUCOSE 275*    Electrolytes  Recent Labs Lab 09/07/16 1011  CALCIUM 8.7*  MG 1.1*  PHOS 3.2    CBC  Recent Labs Lab 09/07/16 1011  WBC 1.6*  HGB 8.4*  HCT 24.4*  PLT 38*    Coag's No results for input(s): APTT, INR in the last 168 hours.  Sepsis Markers No results for input(s): LATICACIDVEN, PROCALCITON, O2SATVEN in the last 168 hours.  ABG No results for input(s): PHART, PCO2ART, PO2ART in the last 168 hours.  Liver Enzymes  Recent Labs Lab 09/07/16 1011  AST 58*  ALT 46  ALKPHOS 80  BILITOT 0.5  ALBUMIN 3.0*    Cardiac Enzymes No results for input(s): TROPONINI, PROBNP in the last 168 hours.  Glucose  Recent Labs Lab 09/08/16 0132  GLUCAP 387*    Imaging No results found.   STUDIES:  CT head 3/6 > no acute intracranial abnormality  CULTURES: Blood 3/7 > Urine 3/7 > Tracheal aspirate 3/7 >  ANTIBIOTICS:   SIGNIFICANT EVENTS:   LINES/TUBES: ETT 3/7 >  DISCUSSION: 66 year old female with past history of ovarian cancer on current chemotherapy. Status post 1 treatment. Suffered seizure at home on 3/6 and 3 subsequent seizure in the emergency department prompting intubation for airway protection. She was transferred to [facility] for neurology evaluation.  ASSESSMENT /  PLAN:  PULMONARY A: Inability to protect airway due to seizures/post ictal Pulmonary edema  P:   Full vent support Daily SBT ABG CXR VAP bundle  CARDIOVASCULAR A:  Hypertension Volume overlaod  P:  Telemetry monitoring PRN hydralazine to keep SBP < 170 Echo  RENAL A:   AKI  P:   KVO IVF  GASTROINTESTINAL A:   No acute issues   P:   NPO Protonix for SUP  HEMATOLOGIC A:   Ovarian Ca (followed [facility]) Anemia Thrombocytopenia Leukopenia  P:  Assess CBC Transfuse PRBC for Hemoglobin < 7 Consider oncology consul  INFECTIOUS A:   Leukopenia  P:   Pan culture Defer ABX for now Trend WBC and fever curve  ENDOCRINE A:   DM HHNK  P:   CBG monitoring and SSI Hold IVF as she is volume overloaded  NEUROLOGIC A:   Seizure / Status epilepticus  P:   RASS goal: 0 Neurology to see Given Keppra in ED Propofol and fenatnyl infusions.    FAMILY  - Updates: husband updated bedside  -  Inter-disciplinary family meet or Palliative Care meeting due by:  3/12    Georgann Housekeeper, AGACNP-BC [facility]/Critical Care Pager [phone] or [phone]  09/08/2016 2:26 AM  Attending Note:  66 year old female with history of ovarian cancer who presented to cancer center on the day of admission for chemo but was pancytopenic and patient was given a unit of pRBCs and sent home.  At home, she was using the bedside camode when she fell and injured her head.  Husband reported that he witnessed a seizure.  EMS was called and patient was brought to the ED where she was noted have another seizure.  Patient was given ativan and was intubated for airway protection.  ABG done then showed metabolic acidosis and patient was diagnosed with DKA and call to [facility] was made for transfer for continuous EEG.  Upon arrival, labs were reviewed, trace ketones in urine and metabolic acidosis has resolved (was likely post ictal lactic acidosis) therefore  patient does not have DKA but is hyperglycemic.  On exam, with propofol off patient became more purposeful and was reaching for the ETT but did not follow commands.  No nuchal rigidity noted.    Will admit to the ICU, ABG repeated here, PaO2 is 341 on 70% and PEEP of 5.  PH is normal.  Will decrease FiO2 to 30% and maintain other vent settings.  Restart propofol.  Neuro consult called to Dr. Cheral Marker.  Hyperglycemia protocol.  Pan culture.  PCT, no abx for now as I see no signs of infection. Will f/u in AM.  The patient is critically ill with multiple organ systems failure and requires high complexity decision making for assessment and support, frequent evaluation and titration of therapies, application of advanced monitoring technologies and extensive interpretation of multiple databases.   Critical Care Time devoted to patient care services described in this note is  45  Minutes. This time reflects time of care of this signee Dr Jennet Maduro. This critical care time does not reflect procedure time, or teaching time or supervisory time of PA/NP/Med student/Med Resident etc but could involve care discussion time.  Rush Farmer, M.D. [facility] Pulmonary/Critical Care Medicine. Pager: [phone]. After hours pager: [phone].

## 2016-09-08 NOTE — Progress Notes (Signed)
[facility] Progress Note Patient Name: Lilliauna Van DOB: 15-Jun-1951 MRN: [id number]   Date of Service  09/08/2016  HPI/Events of Note  Sp grav high Urine osm very high  eICU Interventions  Bolus Increase saline Chem now     Intervention Category Intermediate Interventions: Oliguria - evaluation and management  Raylene Miyamoto. 09/08/2016, 10:48 PM

## 2016-09-08 NOTE — Procedures (Signed)
ELECTROENCEPHALOGRAM REPORT  Date of Study: 09/08/2016  Patient's Name: Kathleen Whitehead MRN: [id number] Date of Birth: [birth year]  Referring Provider: Kerney Elbe, MD  Clinical History: 66 year old women with new-onset seizure in setting of hypomagnesemia and hyperglycemia.  Medications: fentaNYL 2592mcg in NS 260mL (16mcg/ml) infusion-PREMIX  insulin regular (NOVOLIN R,HUMULIN R) levETIRAcetam (KEPPRA) 500 mg pantoprazole (PROTONIX) injection 40 mg  propofol (DIPRIVAN) 1000 MG/100ML infusion   Technical Summary: A multichannel digital EEG recording measured by the international 10-20 system with electrodes applied with paste and impedances below 5000 ohms performed as portable with EKG monitoring in an intubated and sedated patient.  Hyperventilation and photic stimulation were not performed.  The digital EEG was referentially recorded, reformatted, and digitally filtered in a variety of bipolar and referential montages for optimal display.   Description: The patient is intubated and sedated on propofol during the recording.  There is diffuse slowing of the background with 4-5 Hz theta and 2-3 Hz delta activity and no discernible posterior dominant rhythm.  Stage 2 sleep is not seen.  There were no epileptiform discharges or electrographic seizures seen.    EKG lead was unremarkable.  Impression: This sedated EEG is abnormal due to diffuse slowing of the waking background.  Clinical Correlation of the above findings indicates diffuse cerebral dysfunction that is non-specific in etiology and can be seen with hypoxic/ischemic injury, toxic/metabolic encephalopathies, neurodegenerative disorders, or medication effect.  The absence of epileptiform discharges does not rule out a clinical diagnosis of epilepsy.  Clinical correlation is advised.  Metta Clines, DO

## 2016-09-08 NOTE — Progress Notes (Signed)
[facility] Progress Note Patient Name: Kathleen Whitehead DOB: Jun 26, 1951 MRN: [id number]   Date of Service  09/08/2016  HPI/Events of Note  Low output Looks like trend atn Getting blood Assess ua, urine na, osm And output AFTER prbc  eICU Interventions       Intervention Category Intermediate Interventions: Oliguria - evaluation and management  Raylene Miyamoto. 09/08/2016, 5:28 PM

## 2016-09-08 NOTE — Progress Notes (Signed)
Initial Nutrition Assessment  DOCUMENTATION CODES:   Morbid obesity  INTERVENTION:  Recommend Vital High Protein @ goal rate of 20 mL/hour (480 mL/day) and 60 mL Prostat QID to provide 1280 kcal, 162 grams of protein, and 403 mL of free water    Tube feeding regimen with Propofol provides 1749 kcal, 162 grams of protein and 403 mL of free water. Tube feed with Propofol provides 103% of estimated kcal needs and 100% of estimated protein needs.  NUTRITION DIAGNOSIS:   Inadequate oral intake related to inability to eat as evidenced by NPO status.  GOAL:   Provide needs based on ASPEN/SCCM guidelines  MONITOR:   I & O's, Vent status, Labs, Weight trends, TF tolerance  REASON FOR ASSESSMENT:   Ventilator, Malnutrition Screening Tool    ASSESSMENT:   66 year old morbid obese female with ovarian cancer currently on chemotherapy. PMH significant of anemia, HTN, and DM . Patient went in on 3/6 for her second treatment of chemotherapy which was held. Patient was given a unit of PRBC and sent home. Later that evening, the patient fell in the bathroom and began seizing. Patient seized again 3 times in the ED and was intubated for airway protection.  Patient is currently intubated on vent support. MV : 10.5 L/min Temp (24hrs), Avg:98.2 F (36.8 C), Min:97.8 F (36.6 C), Max:98.7 F (37.1 C)  Propofol : 17.8 mL/hr  Nutrition focused physical exam completed. Findings are no muscle depletion and no fat depletion present at this time.  Meds Reviewed: Insulin, Propofol  Labs Reviewed: CBG 145, BUN 25, Creatinine 1.41, Calcium8.4  Diet Order:  Diet NPO time specified  Skin:  Reviewed, no issues  Last BM:  PTA  Height:   Ht Readings from Last 1 Encounters:  09/08/16 5\' 6"  (1.676 m)    Weight:   Wt Readings from Last 1 Encounters:  09/08/16 261 lb 7.5 oz (118.6 kg)    Ideal Body Weight:  59 kg  BMI:  Body mass index is 42.2 kg/m.  Estimated Nutritional Needs:    Kcal:  3664-4034  Protein:  >/=147 grams   Fluid:  >/=1.5 L/day  EDUCATION NEEDS:   No education needs identified at this time  Juliann Pulse M.S. Nutrition Dietetic Intern

## 2016-09-09 ENCOUNTER — Inpatient Hospital Stay (HOSPITAL_COMMUNITY): Payer: Medicare HMO

## 2016-09-09 ENCOUNTER — Encounter (HOSPITAL_COMMUNITY): Payer: Self-pay | Admitting: *Deleted

## 2016-09-09 DIAGNOSIS — J9600 Acute respiratory failure, unspecified whether with hypoxia or hypercapnia: Secondary | ICD-10-CM

## 2016-09-09 DIAGNOSIS — R0600 Dyspnea, unspecified: Secondary | ICD-10-CM

## 2016-09-09 DIAGNOSIS — Z01818 Encounter for other preprocedural examination: Secondary | ICD-10-CM

## 2016-09-09 DIAGNOSIS — Z4659 Encounter for fitting and adjustment of other gastrointestinal appliance and device: Secondary | ICD-10-CM

## 2016-09-09 LAB — BASIC METABOLIC PANEL
Anion gap: 7 (ref 5–15)
Anion gap: 8 (ref 5–15)
BUN: 19 mg/dL (ref 6–20)
BUN: 23 mg/dL — ABNORMAL HIGH (ref 6–20)
CALCIUM: 7.8 mg/dL — AB (ref 8.9–10.3)
CHLORIDE: 108 mmol/L (ref 101–111)
CO2: 19 mmol/L — AB (ref 22–32)
CO2: 20 mmol/L — AB (ref 22–32)
CREATININE: 0.99 mg/dL (ref 0.44–1.00)
Calcium: 8 mg/dL — ABNORMAL LOW (ref 8.9–10.3)
Chloride: 109 mmol/L (ref 101–111)
Creatinine, Ser: 0.98 mg/dL (ref 0.44–1.00)
GFR calc Af Amer: >60 mL/min (ref >60)
GFR calc Af Amer: >60 mL/min (ref >60)
GFR calc non Af Amer: 58 mL/min — ABNORMAL LOW (ref >60)
GFR calc non Af Amer: 58 mL/min — ABNORMAL LOW (ref >60)
GLUCOSE: 189 mg/dL — AB (ref 65–99)
GLUCOSE: 190 mg/dL — AB (ref 65–99)
POTASSIUM: 4.3 mmol/L (ref 3.5–5.1)
Potassium: 4.1 mmol/L (ref 3.5–5.1)
Sodium: 135 mmol/L (ref 135–145)
Sodium: 136 mmol/L (ref 135–145)

## 2016-09-09 LAB — GLUCOSE, CAPILLARY
GLUCOSE-CAPILLARY: 166 mg/dL — AB (ref 65–99)
GLUCOSE-CAPILLARY: 167 mg/dL — AB (ref 65–99)
GLUCOSE-CAPILLARY: 169 mg/dL — AB (ref 65–99)
GLUCOSE-CAPILLARY: 198 mg/dL — AB (ref 65–99)
Glucose-Capillary: 183 mg/dL — ABNORMAL HIGH (ref 65–99)
Glucose-Capillary: 141 mg/dL — ABNORMAL HIGH (ref 65–99)
Glucose-Capillary: 191 mg/dL — ABNORMAL HIGH (ref 65–99)
Glucose-Capillary: 146 mg/dL — ABNORMAL HIGH (ref 65–99)

## 2016-09-09 LAB — CBC
HEMATOCRIT: 23.6 % — AB (ref 36.0–46.0)
Hemoglobin: 8 g/dL — ABNORMAL LOW (ref 12.0–15.0)
MCH: 29.5 pg (ref 26.0–34.0)
MCHC: 33.9 g/dL (ref 30.0–36.0)
MCV: 87.1 fL (ref 78.0–100.0)
Platelets: 17 10*3/uL — CL (ref 150–400)
RBC: 2.71 MIL/uL — ABNORMAL LOW (ref 3.87–5.11)
RDW: 16.9 % — AB (ref 11.5–15.5)
WBC: 5.3 10*3/uL (ref 4.0–10.5)

## 2016-09-09 LAB — PREPARE RBC (CROSSMATCH)

## 2016-09-09 LAB — URINE CULTURE: CULTURE: NO GROWTH

## 2016-09-09 LAB — LACTIC ACID, PLASMA: LACTIC ACID, VENOUS: 0.8 mmol/L (ref 0.5–1.9)

## 2016-09-09 LAB — TRIGLYCERIDES: TRIGLYCERIDES: 223 mg/dL — AB (ref <150)

## 2016-09-09 MED ORDER — ORAL CARE MOUTH RINSE
15.0000 mL | OROMUCOSAL | Status: DC
  Administered 2016-09-10: 15 mL via OROMUCOSAL

## 2016-09-09 MED ORDER — INSULIN ASPART 100 UNIT/ML ~~LOC~~ SOLN: 0.0000 [IU] | Freq: Three times a day (TID) | SUBCUTANEOUS | Status: DC

## 2016-09-09 MED ORDER — INSULIN ASPART 100 UNIT/ML ~~LOC~~ SOLN
0.0000 [IU] | SUBCUTANEOUS | Status: DC
  Administered 2016-09-09: 2 [IU] via SUBCUTANEOUS
  Administered 2016-09-09 (×4): 3 [IU] via SUBCUTANEOUS
  Administered 2016-09-10 (×3): 2 [IU] via SUBCUTANEOUS
  Administered 2016-09-11: 5 [IU] via SUBCUTANEOUS
  Administered 2016-09-11: 8 [IU] via SUBCUTANEOUS
  Administered 2016-09-11: 2 [IU] via SUBCUTANEOUS
  Administered 2016-09-11: 11 [IU] via SUBCUTANEOUS
  Administered 2016-09-11: 8 [IU] via SUBCUTANEOUS
  Administered 2016-09-12: 11 [IU] via SUBCUTANEOUS
  Administered 2016-09-12: 3 [IU] via SUBCUTANEOUS
  Administered 2016-09-12: 5 [IU] via SUBCUTANEOUS

## 2016-09-09 MED ORDER — ACETAMINOPHEN 650 MG RE SUPP
650.0000 mg | RECTAL | Status: DC | PRN
  Administered 2016-09-09: 650 mg via RECTAL
  Filled 2016-09-09: qty 1

## 2016-09-09 NOTE — Procedures (Signed)
Extubation Procedure Note  Patient Details:   Name: Kathleen Whitehead DOB: Mar 22, 1951 MRN: [id number]   Airway Documentation:     Evaluation  O2 sats: stable throughout Complications: No apparent complications Patient did tolerate procedure well. Bilateral Breath Sounds: Clear, Diminished   Yes   Pt extubated to 3L [place] per MD order. Pt stable throughout with no complications. VS within normal limits. Pt able to speak but had to be coached multiple times to cough post extubation. Clear/diminished BS throughout bilaterally. RN at bedside. RT will continue to monitor.   Laymond Purser M 09/09/2016, 11:04 AM

## 2016-09-09 NOTE — Progress Notes (Signed)
eLink Physician-Brief Progress Note Patient Name: Kathleen Whitehead DOB: 1950-12-25 MRN: [id number]   Date of Service  09/09/2016  HPI/Events of Note  Patient  postextubation. Camera check shows patient laying fully recumbent. Nurse at bedside.   eICU Interventions  Continuing ICU monitoring.      Intervention Category Major Interventions: Respiratory failure - evaluation and management  Tera Partridge 09/09/2016, 8:10 PM

## 2016-09-09 NOTE — Progress Notes (Signed)
Subjective: Patient is currently on Keppra and handling this well. When taken off propofol she is not severely agitated and she is not severely sedated.  Exam: Vitals:   09/09/16 0726 09/09/16 0800  BP: (!) 152/57   Pulse: 90   Resp: 17   Temp:  99.7 F (37.6 C)        Gen: In bed, NAD MS: On propofol but able to follow commands CN: Pupils are 2 mm reactive, extraocular movement movements are intact, Face symmetrical, really intubated Motor: Moving all extremities antigravity Sensory: Facial grossly intact   Pertinent Labs/Diagnostics: EEG: Impression: This sedated EEG is abnormal due to diffuse slowing of the waking background.  Clinical Correlation of the above findings indicates diffuse cerebral dysfunction that is non-specific in etiology and can be seen with hypoxic/ischemic injury, toxic/metabolic encephalopathies, neurodegenerative disorders, or medication effect.  The absence of epileptiform discharges does not rule out a clinical diagnosis of epilepsy.  Clinical correlation is advised.  Impression: 66 year old female on chemotherapy for ovarian cancer with new onset seizures and hyperglycemia 1. Low magnesium with lab draw at 10 AM yesterday. Hypomagnesemia a possible trigger for her seizures. 2. Hyperglycemia in the high 300's earlier today. If levels were higher at home, this could serve as a trigger for her seizures.  3. CT head pending  Recommendations: 1) continue at this time will continue kepper 500 mg IV BID  Etta Quill PA-C [facility] [phone]    09/09/2016, 9:20 AM

## 2016-09-09 NOTE — Progress Notes (Signed)
PULMONARY / CRITICAL CARE MEDICINE   Name: Kathleen Whitehead MRN: [id number] DOB: 06/12/1951    ADMISSION DATE:  09/08/2016 CONSULTATION DATE:  3/7  REFERRING MD:  Dr Annamaria Boots EDP  CHIEF COMPLAINT:  Seizure  HISTORY OF PRESENT ILLNESS:   66 year old female with past medical history as below, which is significant for ovarian cancer on current chemotherapy, and diabetes mellitus. So far she has only received one chemotherapy treatment which will serve to become anemic, thrombocytopenic, and leukopenic. For those reasons, which she returned for her second treatment of chemotherapy on 3/6 the treatment was held. She was given a unit of PRBC and was sent home. Later that evening while using the bedside commode her husband heard her fall to the ground and witness what he described as seizure-like activity. She then went on to seize again 3 times in the emergency department and was intubated for airway protection. She was transferred to [facility] for neurology evaluation. 3/8 following commands. EEG non specific.    SUBJECTIVE:  More awake and follows commands,  VITAL SIGNS: BP (!) 152/57   Pulse 90   Temp 99.7 F (37.6 C) (Axillary)   Resp 17   Ht '5\' 6"'$  (1.676 m)   Wt 266 lb 12.1 oz (121 kg)   SpO2 100%   BMI 43.06 kg/m   HEMODYNAMICS:    VENTILATOR SETTINGS: Vent Mode: PRVC FiO2 (%):  [30 %-40 %] 30 % Set Rate:  [14 bmp] 14 bmp Vt Set:  [530 mL] 530 mL PEEP:  [5 cmH20] 5 cmH20 Pressure Support:  [5 cmH20] 5 cmH20 Plateau Pressure:  [11 cmH20-24 cmH20] 24 cmH20  INTAKE / OUTPUT: I/O last 3 completed shifts: In: 2544.2 [I.V.:1564.2; Blood:435; Other:335; IV BZJIRCVEL:381] Out: 625 [Urine:625]  PHYSICAL EXAMINATION: General:  Morbidly obese female on ventilator Neuro:  Does follow commands, weak but purposeful. HEENT:  Normocephalic, ecchymosis to left temporal area has reports this is new since seizure /fall Cardiovascular:  RRR, no MRG Lungs:  Coarse crackles  bilaterally Abdomen:  Soft, nondistended, normoactive bowel sounds Musculoskeletal:  No acute deformity. Bilateral lower extremity 2+ pitting edema Skin:  Grossly intact  LABS:  BMET  Recent Labs Lab 09/08/16 0520 09/08/16 2330 09/09/16 0420  NA 135 136 135  K 4.8 4.1 4.3  CL 104 109 108  CO2 21* 20* 19*  BUN 25* 23* 19  CREATININE 1.41* 0.99 0.98  GLUCOSE 280* 190* 189*    Electrolytes  Recent Labs Lab 09/07/16 1011 09/08/16 0239 09/08/16 0520 09/08/16 2330 09/09/16 0420  CALCIUM 8.7* 8.5* 8.4* 7.8* 8.0*  MG 1.1* 2.2  --   --   --   PHOS 3.2 2.7  --   --   --     CBC  Recent Labs Lab 09/08/16 0239 09/08/16 1206 09/09/16 0420  WBC 2.7* 4.1 5.3  HGB 8.2* 7.6* 8.0*  HCT 24.2* 22.5* 23.6*  PLT 30* 26* 17*    Coag's  Recent Labs Lab 09/08/16 0239  APTT 26  INR 1.05    Sepsis Markers  Recent Labs Lab 09/08/16 0239 09/08/16 0847  LATICACIDVEN 2.9* 2.6*  PROCALCITON 0.76  --     ABG  Recent Labs Lab 09/08/16 0141 09/08/16 0422 09/08/16 0828  PHART 7.370 7.382 7.396  PCO2ART 34.6 35.6 34.6  PO2ART 341* 178* 182*    Liver Enzymes  Recent Labs Lab 09/07/16 1011 09/08/16 0239  AST 58* 70*  ALT 46 54  ALKPHOS 80 76  BILITOT 0.5 0.7  ALBUMIN  3.0* 2.6*    Cardiac Enzymes No results for input(s): TROPONINI, PROBNP in the last 168 hours.  Glucose  Recent Labs Lab 09/08/16 2109 09/08/16 2206 09/08/16 2254 09/09/16 0042 09/09/16 0332 09/09/16 0758  GLUCAP 172* 192* 183* 166* 141* 198*    Imaging Dg Chest Port 1 View  Result Date: 09/09/2016 CLINICAL DATA:  Respiratory failure EXAM: PORTABLE CHEST 1 VIEW COMPARISON:  09/08/2016 FINDINGS: Endotracheal tube and nasogastric catheter are again seen and stable. Right-sided chest wall port is again noted and stable. Cardiac shadow is within normal limits. The lungs are well aerated without focal infiltrate or sizable effusion. IMPRESSION: No acute abnormality noted. Electronically  Signed   By: Inez Catalina M.D.   On: 09/09/2016 08:21     STUDIES:  CT head 3/6 > no acute intracranial abnormality  CULTURES: Blood 3/7 > Urine 3/7 >neg Tracheal aspirate 3/7 >  ANTIBIOTICS:   SIGNIFICANT EVENTS:   LINES/TUBES: ETT 3/7 >  DISCUSSION: 66 year old female with past history of ovarian cancer on current chemotherapy. Status post 1 treatment. Suffered seizure at home on 3/6 and 3 subsequent seizure in the emergency department prompting intubation for airway protection. She was transferred to [facility] for neurology evaluation.  ASSESSMENT / PLAN:  PULMONARY A: Inability to protect airway due to seizures/post ictal, 3/8 more awke and weaning Pulmonary edema  P:   Full vent support Daily SBT, 3/8 weaning but very weak, not sure if she can protect her airway if extubated. ABG CXR VAP bundle  CARDIOVASCULAR A:  Hypertension   P:  Telemetry monitoring PRN hydralazine to keep SBP < 170 Echo  RENAL Lab Results  Component Value Date   CREATININE 0.98 09/09/2016   CREATININE 0.99 09/08/2016   CREATININE 1.41 (H) 09/08/2016    A:   AKI  P:   Ns 125  GASTROINTESTINAL A:   No acute issues   P:   NPO Protonix for SUP  HEMATOLOGIC  Recent Labs  09/08/16 1206 09/09/16 0420  HGB 7.6* 8.0*    A:   Ovarian Ca (followed [place] [facility]) Anemia Thrombocytopenia Leukopenia post chemo x1  P:  Follow CBC Transfuse PRBC for Hemoglobin < 7 Consider oncology consul  INFECTIOUS A:   Leukopenia  P:   Pan culture Defer ABX for now Trend WBC and fever curve  ENDOCRINE CBG (last 3)   Recent Labs  09/09/16 0042 09/09/16 0332 09/09/16 0758  GLUCAP 166* 141* 198*     A:   DM HHNK  P:   CBG monitoring and SSI IVF for dehydration ns at 125 cc/hr  NEUROLOGIC A:   Seizure / Status epilepticus. More awake 3/8  P:   RASS goal: 0 Neurology following Given Keppra in ED Propofol and fenatnyl infusions. Hold  sedation as more wake and on weaning   FAMILY  - Updates: 3/8 no family at bedside  - Inter-disciplinary family meet or Palliative Care meeting due by:  3/12  App CCT 30 min  Richardson Landry Minor ACNP Maryanna Shape PCCM Pager [phone] till 3 pm If no answer page [phone] 09/09/2016, 9:00 AM  STAFF NOTE: I, Merrie Roof, MD FACP have personally reviewed patient's available data, including medical history, events of note, physical examination and test results as part of my evaluation. I have discussed with resident/NP and other care providers such as pharmacist, RN and RRT. In addition, I personally evaluated patient and elicited key findings of: awakens follows commands, lungs clear, she takes 1.2 liters TV, cough  strong, with full WUA prop wide awake, and maintains alertness, edema mild, CT head and pcxr reviewed, seizures unclear etiology have to r/o mets, wean aggressive cpap 5 ps5, goal 1 hour met well, she protects airway well, no further seizures noted, plan extubation, needs MRI brian, would NOT send if extubated today would do in am , low plat from chemo, follow trend, follow wbc closely, NO TF as about to extubate, anti seizure meds per neuro, I updated husband in full The patient is critically ill with multiple organ systems failure and requires high complexity decision making for assessment and support, frequent evaluation and titration of therapies, application of advanced monitoring technologies and extensive interpretation of multiple databases.   Critical Care Time devoted to patient care services described in this note is 34 Minutes. This time reflects time of care of this signee: Merrie Roof, MD FACP. This critical care time does not reflect procedure time, or teaching time or supervisory time of PA/NP/Med student/Med Resident etc but could involve care discussion time. Rest per NP/medical resident whose note is outlined above and that I agree with   Lavon Paganini. Titus Mould, MD,  [place] Pgr: [facility] 09/09/2016 10:30 AM

## 2016-09-10 ENCOUNTER — Inpatient Hospital Stay (HOSPITAL_COMMUNITY): Payer: Medicare HMO

## 2016-09-10 DIAGNOSIS — R569 Unspecified convulsions: Secondary | ICD-10-CM

## 2016-09-10 DIAGNOSIS — D61810 Antineoplastic chemotherapy induced pancytopenia: Secondary | ICD-10-CM

## 2016-09-10 DIAGNOSIS — E1165 Type 2 diabetes mellitus with hyperglycemia: Secondary | ICD-10-CM

## 2016-09-10 DIAGNOSIS — C541 Malignant neoplasm of endometrium: Secondary | ICD-10-CM

## 2016-09-10 DIAGNOSIS — C569 Malignant neoplasm of unspecified ovary: Secondary | ICD-10-CM

## 2016-09-10 LAB — CULTURE, RESPIRATORY W GRAM STAIN

## 2016-09-10 LAB — FERRITIN: Ferritin: 658 ng/mL — ABNORMAL HIGH (ref 11–307)

## 2016-09-10 LAB — PREPARE RBC (CROSSMATCH)

## 2016-09-10 LAB — CBC
HEMATOCRIT: 19.8 % — AB (ref 36.0–46.0)
HEMATOCRIT: 22.4 % — AB (ref 36.0–46.0)
Hemoglobin: 6.7 g/dL — CL (ref 12.0–15.0)
Hemoglobin: 7.6 g/dL — ABNORMAL LOW (ref 12.0–15.0)
MCH: 29.6 pg (ref 26.0–34.0)
MCH: 30.2 pg (ref 26.0–34.0)
MCHC: 33.8 g/dL (ref 30.0–36.0)
MCHC: 33.9 g/dL (ref 30.0–36.0)
MCV: 87.6 fL (ref 78.0–100.0)
MCV: 88.9 fL (ref 78.0–100.0)
Platelets: 8 10*3/uL — CL (ref 150–400)
Platelets: 30 10*3/uL — ABNORMAL LOW (ref 150–400)
RBC: 2.26 MIL/uL — ABNORMAL LOW (ref 3.87–5.11)
RBC: 2.52 MIL/uL — ABNORMAL LOW (ref 3.87–5.11)
RDW: 16.1 % — AB (ref 11.5–15.5)
RDW: 15.5 % (ref 11.5–15.5)
WBC: 4.5 10*3/uL (ref 4.0–10.5)
WBC: 4 10*3/uL (ref 4.0–10.5)

## 2016-09-10 LAB — BASIC METABOLIC PANEL
ANION GAP: 6 (ref 5–15)
BUN: 9 mg/dL (ref 6–20)
CHLORIDE: 114 mmol/L — AB (ref 101–111)
CO2: 21 mmol/L — AB (ref 22–32)
Calcium: 7.8 mg/dL — ABNORMAL LOW (ref 8.9–10.3)
Creatinine, Ser: 0.8 mg/dL (ref 0.44–1.00)
GFR calc Af Amer: >60 mL/min (ref >60)
GLUCOSE: 118 mg/dL — AB (ref 65–99)
POTASSIUM: 4 mmol/L (ref 3.5–5.1)
Sodium: 141 mmol/L (ref 135–145)

## 2016-09-10 LAB — GLUCOSE, CAPILLARY
GLUCOSE-CAPILLARY: 95 mg/dL (ref 65–99)
Glucose-Capillary: 130 mg/dL — ABNORMAL HIGH (ref 65–99)
Glucose-Capillary: 95 mg/dL (ref 65–99)
Glucose-Capillary: 88 mg/dL (ref 65–99)
Glucose-Capillary: 139 mg/dL — ABNORMAL HIGH (ref 65–99)
Glucose-Capillary: 122 mg/dL — ABNORMAL HIGH (ref 65–99)

## 2016-09-10 LAB — CULTURE, RESPIRATORY

## 2016-09-10 MED ORDER — ENSURE ENLIVE PO LIQD
1.0000 | Freq: Two times a day (BID) | ORAL | Status: DC
  Administered 2016-09-12: 237 mL via ORAL

## 2016-09-10 MED ORDER — DARBEPOETIN ALFA 200 MCG/0.4ML IJ SOSY
200.0000 ug | PREFILLED_SYRINGE | INTRAMUSCULAR | Status: DC
  Administered 2016-09-10: 200 ug via SUBCUTANEOUS
  Filled 2016-09-10: qty 0.4

## 2016-09-10 MED ORDER — OXYCODONE HCL 5 MG PO TABS
5.0000 mg | ORAL_TABLET | ORAL | Status: DC | PRN
  Administered 2016-09-10 – 2016-09-12 (×13): 5 mg via ORAL
  Filled 2016-09-10 (×13): qty 1

## 2016-09-10 MED ORDER — OXYCODONE-ACETAMINOPHEN 5-325 MG PO TABS
1.0000 | ORAL_TABLET | ORAL | Status: DC | PRN
  Administered 2016-09-10 – 2016-09-12 (×13): 1 via ORAL
  Filled 2016-09-10 (×13): qty 1

## 2016-09-10 MED ORDER — SODIUM CHLORIDE 0.9 % IV SOLN
Freq: Once | INTRAVENOUS | Status: AC
  Administered 2016-09-10: 12:00:00 via INTRAVENOUS

## 2016-09-10 NOTE — Progress Notes (Signed)
MRI called this RN to verify MRI order. Noted IAC MRI included with MRI order. MRI tech Thurmond Butts, felt incorrect order.  Called MD to verify order, Dr. Jimmy Footman stated she will look at the order and change it...MRI called, notified of possible change of order.

## 2016-09-10 NOTE — Progress Notes (Signed)
PT Cancellation Note  Patient Details Name: Glena Pharris MRN: [id number] DOB: June 14, 1951   Cancelled Treatment:    Reason Eval/Treat Not Completed: Medical issues which prohibited therapy, hemoglobin 6.7 g/dL. Will follow up with PT as time allows when it is medically appropriate.   Enis Gash, SPT Office-[phone]  Mabeline Caras 09/10/2016, 8:42 AM

## 2016-09-10 NOTE — Progress Notes (Signed)
Stopped by to see pt and/or family. Husband Juleen China was not in the rm, but pt opened her eyes drowsily and greeted me. Said brief prayer of blessing, for healing. Chaplain available for f/u.   09/10/16 0800  Clinical Encounter Type  Visited With Patient  Visit Type Follow-up;Psychological support;Spiritual support;Social support;Critical Care  Referral From Chaplain  Spiritual Encounters  Spiritual Needs Emotional  Stress Factors  Patient Stress Factors Health changes;Loss of control   Gerrit Heck, Chaplain

## 2016-09-10 NOTE — Progress Notes (Signed)
~  0345: Explained pt about MRI, noise, machine, time, and what to expect. Reassured pt that RN would be with pt behind glass monitoring. Pt states "Is it similar to a PET scan." Stated that it is similar. Pt verbalized understanding. ~0515: Pt down in MRI. Place on MRI stretcher, attempt to take pillow out from head. Pt states "No, I can't lay flat. I feel like I will suffocate and I have back pain". Explained the importance of the MRI, offered to call MD to get medicine for pain and to help relax. Pt continued to refuse MRI.  Pt placed back in bed and taken to 3M10.

## 2016-09-10 NOTE — Progress Notes (Addendum)
Pharmacy Consult:  Aranesp Dosing    83 yof with anemia due to chemotherapy in ovarian cancer on carboplatin/gemcitabine. Pharmacy consulted to start Aranesp. Hg 6.7 - to transfuse 1u PRBC. Plt down to 17 - MD may consult heme/onc with further drop. CCM to order Fe panel.   Plan: Aranesp 269mcg [place] qFri - 1800 Monitor CBC F/u Fe panel   Elicia Lamp, PharmD, BCPS Clinical Pharmacist 09/10/2016 12:14 PM

## 2016-09-10 NOTE — Progress Notes (Signed)
PULMONARY / CRITICAL CARE MEDICINE   Name: Kathleen Whitehead MRN: [id number] DOB: 1951-04-10    ADMISSION DATE:  09/08/2016 CONSULTATION DATE:  3/7  REFERRING MD:  Dr Annamaria Boots EDP  CHIEF COMPLAINT:  Seizure  HISTORY OF PRESENT ILLNESS:   66 year old female with past medical history as below, which is significant for ovarian cancer on current chemotherapy, and diabetes mellitus. So far she has only received one chemotherapy treatment which will serve to become anemic, thrombocytopenic, and leukopenic. For those reasons, which she returned for her second treatment of chemotherapy on 3/6 the treatment was held. She was given a unit of PRBC and was sent home. Later that evening while using the bedside commode her husband heard her fall to the ground and witness what he described as seizure-like activity. She then went on to seize again 3 times in the emergency department and was intubated for airway protection. She was transferred to [facility] for neurology evaluation. 3/8 following commands. EEG non specific.  SUBJECTIVE:  Could not do MRI, could not lay flat Extubated well  VITAL SIGNS: BP (!) 137/41   Pulse 74   Temp 98.9 F (37.2 C) (Oral)   Resp 14   Ht 5\' 6"  (1.676 m)   Wt 120.8 kg (266 lb 5.1 oz)   SpO2 97%   BMI 42.98 kg/m   HEMODYNAMICS:    VENTILATOR SETTINGS:    INTAKE / OUTPUT: I/O last 3 completed shifts: In: 4533.7 [I.V.:4113.7; IV Piggyback:420] Out: 2130 [Urine:2130]  PHYSICAL EXAMINATION: General:  No distress Neuro:  Does follow commands Alrt o riented HEENT: no trauma, no neck pain Cardiovascular:  RRR, no MRG Lungs:  CTA Abdomen:  Soft, nondistended, normoactive bowel sounds Musculoskeletal: 2+ pitting edema Skin:  Grossly intact  LABS:  BMET  Recent Labs Lab 09/08/16 2330 09/09/16 0420 09/10/16 0631  NA 136 135 141  K 4.1 4.3 4.0  CL 109 108 114*  CO2 20* 19* 21*  BUN 23* 19 9  CREATININE 0.99 0.98 0.80  GLUCOSE 190* 189* 118*     Electrolytes  Recent Labs Lab 09/07/16 1011 09/08/16 0239  09/08/16 2330 09/09/16 0420 09/10/16 0631  CALCIUM 8.7* 8.5*  < > 7.8* 8.0* 7.8*  MG 1.1* 2.2  --   --   --   --   PHOS 3.2 2.7  --   --   --   --   < > = values in this interval not displayed.  CBC  Recent Labs Lab 09/08/16 1206 09/09/16 0420 09/10/16 0631  WBC 4.1 5.3 4.5  HGB 7.6* 8.0* 6.7*  HCT 22.5* 23.6* 19.8*  PLT 26* 17* 8*    Coag's  Recent Labs Lab 09/08/16 0239  APTT 26  INR 1.05    Sepsis Markers  Recent Labs Lab 09/08/16 0239 09/08/16 0847 09/09/16 1041  LATICACIDVEN 2.9* 2.6* 0.8  PROCALCITON 0.76  --   --     ABG  Recent Labs Lab 09/08/16 0141 09/08/16 0422 09/08/16 0828  PHART 7.370 7.382 7.396  PCO2ART 34.6 35.6 34.6  PO2ART 341* 178* 182*    Liver Enzymes  Recent Labs Lab 09/07/16 1011 09/08/16 0239  AST 58* 70*  ALT 46 54  ALKPHOS 80 76  BILITOT 0.5 0.7  ALBUMIN 3.0* 2.6*    Cardiac Enzymes No results for input(s): TROPONINI, PROBNP in the last 168 hours.  Glucose  Recent Labs Lab 09/09/16 1530 09/09/16 1956 09/09/16 2328 09/10/16 0350 09/10/16 0753 09/10/16 1134  GLUCAP 191* 167* 146*  130* 95 95    Imaging Dg Chest Port 1 View  Result Date: 09/10/2016 CLINICAL DATA:  Hypertension, seizure, and ovarian cancer. EXAM: PORTABLE CHEST 1 VIEW COMPARISON:  09/09/2016 FINDINGS: Right-sided PowerPort tip overlies the level of the right atrium. The heart is enlarged. Lungs are clear. Endotracheal tube and nasogastric tube have been removed. IMPRESSION: No evidence for acute cardiopulmonary abnormality. Electronically Signed   By: Nolon Nations M.D.   On: 09/10/2016 09:09     STUDIES:  CT head 3/6 > no acute intracranial abnormality 3/8 MRI, unable to do flat 3/8  CULTURES: Blood 3/7 > Urine 3/7 >neg Tracheal aspirate 3/7 >  ANTIBIOTICS:   SIGNIFICANT EVENTS: 3/8 extubated  LINES/TUBES: ETT 3/7 >3/8  DISCUSSION: 66 year old  female with past history of ovarian cancer on current chemotherapy. Status post 1 treatment. Suffered seizure at home on 3/6 and 3 subsequent seizure in the emergency department prompting intubation for airway protection. She was transferred to [facility] for [facility]y evaluation.  ASSESSMENT / PLAN:  PULMONARY A: Inability to protect airway due to seizures/post ictal, 3/8 more awke and weaning Pulmonary edema  P:   IS Walking as goal  CARDIOVASCULAR A:  Hypertension resolved  P:  Telemetry monitoring PRN hydralazine Echo p  RENAL Lab Results  Component Value Date   CREATININE 0.80 09/10/2016   CREATININE 0.98 09/09/2016   CREATININE 0.99 09/08/2016    A:   AKI  P:   Ns 125- kvo  GASTROINTESTINAL A:   No acute issues   P:   Diet add  HEMATOLOGIC  Recent Labs  09/09/16 0420 09/10/16 0631  HGB 8.0* 6.7*    A:   Ovarian Ca (followed [facility]) Anemia Thrombocytopenia Leukopenia, pancyto, anemia  P:  Follow CBC Transfuse PRBC for Hemoglobin < 7 Consider having her onc/heme see with further plat drop Considering plat Tx with less 10 k Cbc in am  For one unit prbc also Get ferritin, dose epo   ENDOCRINE CBG (last 3)   Recent Labs  09/10/16 0350 09/10/16 0753 09/10/16 1134  GLUCAP 130* 95 95     A:   DM HHNK  P:   Diet ssi kvo  NEUROLOGIC A:   Seizure / Status epilepticus. More awake 3/8 R/o mets P:   Anti siezure per neuro Mri when able brain  FAMILY  - Updates: 3/8 no family at bedside, 3/9  To triad, med floor  - Inter-disciplinary family meet or Palliative Care meeting due by:  3/12   Lavon Paganini. Titus Mould, MD, [place] Pgr: [place] [facility] 09/10/2016 11:38 AM

## 2016-09-10 NOTE — Progress Notes (Addendum)
Kathleen Whitehead   [id number] DO:03/06/1951   PO#:[id number]   ERX#:[id number]  ONCOLOGY FOLLOW UP  Subjective: Pt have been seen at our [facility] for Ovarain/uterine cancer, on chemo.  Patient is feeling better today. She was admitted for seizures at home. Her last chemotherapy treatment was 2/28, and she received Magnesium and a blood transfusion on 3/7. She was unable to complete this week's chemo due to low platelet counts of 8k. Seizure resulted in her falling and she has hematomas on her face. Patient says she has never been able to lay flat because her breathing gets worse. Denies cough and shortness of breath. No pain in the abdomen area, no bleeding and no bruising on legs.    Objective:  Vitals:   09/10/16 1900 09/10/16 2000  BP: (!) 132/44   Pulse: 78   Resp: 15   Temp:  98.1 F (36.7 C)    Body mass index is 42.98 kg/m.  Intake/Output Summary (Last 24 hours) at 09/10/16 2032 Last data filed at 09/10/16 1800  Gross per 24 hour  Intake          2469.91 ml  Output             1100 ml  Net          1369.91 ml     Sclerae unicteric  Oropharynx clear  No peripheral adenopathy  Lungs clear -- no rales or rhonchi  Heart regular rate and rhythm  Abdomen benign  MSK no focal spinal tenderness, no peripheral edema  Neuro nonfocal             (+) Diffuse ecchymosis on both forearms and left side face   CBG (last 3)   Recent Labs  09/10/16 1134 09/10/16 1551 09/10/16 1939  GLUCAP 95 88 139*     Labs:  Lab Results  Component Value Date   WBC 4.5 09/10/2016   HGB 6.7 (LL) 09/10/2016   HCT 19.8 (L) 09/10/2016   MCV 87.6 09/10/2016   PLT 8 (LL) 09/10/2016   NEUTROABS 0.7 (L) 09/07/2016   CMP Latest Ref Rng & Units 09/10/2016 09/09/2016 09/08/2016  Glucose 65 - 99 mg/dL 118(H) 189(H) 190(H)  BUN 6 - 20 mg/dL 9 19 23(H)  Creatinine 0.44 - 1.00 mg/dL 0.80 0.98 0.99  Sodium 135 - 145 mmol/L 141 135 136  Potassium 3.5 - 5.1 mmol/L 4.0 4.3 4.1  Chloride 101 - 111  mmol/L 114(H) 108 109  CO2 22 - 32 mmol/L 21(L) 19(L) 20(L)  Calcium 8.9 - 10.3 mg/dL 7.8(L) 8.0(L) 7.8(L)  Total Protein 6.5 - 8.1 g/dL - - -  Total Bilirubin 0.3 - 1.2 mg/dL - - -  Alkaline Phos 38 - 126 U/L - - -  AST 15 - 41 U/L - - -  ALT 14 - 54 U/L - - -    Urine Studies No results for input(s): UHGB, CRYS in the last 72 hours.  Invalid input(s): UACOL, UAPR, USPG, UPH, UTP, UGL, [place], UBIL, UNIT, UROB, [place], UEPI, UWBC, Junie Panning [place], [place], [place]  Basic Metabolic Panel:  Recent Labs Lab 09/07/16 1011 09/08/16 0239 09/08/16 0520 09/08/16 2330 09/09/16 0420 09/10/16 0631  NA 134* 134* 135 136 135 141  K 4.6 5.2* 4.8 4.1 4.3 4.0  CL 102 104 104 109 108 114*  CO2 25 21* 21* 20* 19* 21*  GLUCOSE 275* 397* 280* 190* 189* 118*  BUN 17 23* 25* 23* 19 9  CREATININE 1.18* 1.49* 1.41* 0.99 0.98  0.80  CALCIUM 8.7* 8.5* 8.4* 7.8* 8.0* 7.8*  MG 1.1* 2.2  --   --   --   --   PHOS 3.2 2.7  --   --   --   --    GFR Estimated Creatinine Clearance: 90.4 mL/min (by C-G formula based on SCr of 0.8 mg/dL). Liver Function Tests:  Recent Labs Lab 09/07/16 1011 09/08/16 0239  AST 58* 70*  ALT 46 54  ALKPHOS 80 76  BILITOT 0.5 0.7  PROT 6.6 5.9*  ALBUMIN 3.0* 2.6*   No results for input(s): LIPASE, AMYLASE in the last 168 hours. No results for input(s): AMMONIA in the last 168 hours. Coagulation profile  Recent Labs Lab 09/08/16 0239  INR 1.05    CBC:  Recent Labs Lab 09/07/16 1011 09/08/16 0239 09/08/16 1206 09/09/16 0420 09/10/16 0631  WBC 1.6* 2.7* 4.1 5.3 4.5  NEUTROABS 0.7*  --   --   --   --   HGB 8.4* 8.2* 7.6* 8.0* 6.7*  HCT 24.4* 24.2* 22.5* 23.6* 19.8*  MCV 92.4 87.7 87.9 87.1 87.6  PLT 38* 30* 26* 17* 8*   Cardiac Enzymes: No results for input(s): CKTOTAL, CKMB, CKMBINDEX, TROPONINI in the last 168 hours. BNP: Invalid input(s): POCBNP CBG:  Recent Labs Lab 09/10/16 0350 09/10/16 0753 09/10/16 1134 09/10/16 1551 09/10/16 1939   GLUCAP 130* 95 95 88 139*   D-Dimer No results for input(s): DDIMER in the last 72 hours. Hgb A1c No results for input(s): HGBA1C in the last 72 hours. Lipid Profile  Recent Labs  09/09/16 0420  TRIG 223*   Thyroid function studies No results for input(s): TSH, T4TOTAL, T3FREE, THYROIDAB in the last 72 hours.  Invalid input(s): FREET3 Anemia work up No results for input(s): VITAMINB12, FOLATE, FERRITIN, TIBC, IRON, RETICCTPCT in the last 72 hours. Microbiology Recent Results (from the past 240 hour(s))  MRSA PCR Screening     Status: None   Collection Time: 09/08/16  1:27 AM  Result Value Ref Range Status   MRSA by PCR NEGATIVE NEGATIVE Final    Comment:        The GeneXpert MRSA Assay (FDA approved for NASAL specimens only), is one component of a comprehensive MRSA colonization surveillance program. It is not intended to diagnose MRSA infection nor to guide or monitor treatment for MRSA infections.   Culture, blood (routine x 2)     Status: None (Preliminary result)   Collection Time: 09/08/16  2:30 AM  Result Value Ref Range Status   Specimen Description BLOOD LEFT ANTECUBITAL  Final   Special Requests BOTTLES DRAWN AEROBIC ONLY 10CC  Final   Culture NO GROWTH 2 DAYS  Final   Report Status PENDING  Incomplete  Culture, blood (routine x 2)     Status: None (Preliminary result)   Collection Time: 09/08/16  2:40 AM  Result Value Ref Range Status   Specimen Description BLOOD LEFT HAND  Final   Special Requests IN PEDIATRIC BOTTLE 3CC  Final   Culture NO GROWTH 2 DAYS  Final   Report Status PENDING  Incomplete  Urine culture     Status: None   Collection Time: 09/08/16  3:35 AM  Result Value Ref Range Status   Specimen Description URINE, CATHETERIZED  Final   Special Requests NONE  Final   Culture NO GROWTH  Final   Report Status 09/09/2016 FINAL  Final  Culture, respiratory (tracheal aspirate)     Status: Abnormal   Collection Time: 09/08/16  10:12 AM  Result  Value Ref Range Status   Specimen Description TRACHEAL ASPIRATE  Final   Special Requests NONE  Final   Gram Stain   Final    FEW WBC PRESENT, PREDOMINANTLY PMN FEW GRAM POSITIVE COCCI IN PAIRS RARE GRAM POSITIVE RODS    Culture MULTIPLE ORGANISMS PRESENT, NONE PREDOMINANT (A)  Final   Report Status 09/10/2016 FINAL  Final      Studies:  Dg Chest Port 1 View  Result Date: 09/10/2016 CLINICAL DATA:  Hypertension, seizure, and ovarian cancer. EXAM: PORTABLE CHEST 1 VIEW COMPARISON:  09/09/2016 FINDINGS: Right-sided PowerPort tip overlies the level of the right atrium. The heart is enlarged. Lungs are clear. Endotracheal tube and nasogastric tube have been removed. IMPRESSION: No evidence for acute cardiopulmonary abnormality. Electronically Signed   By: Nolon Nations M.D.   On: 09/10/2016 09:09   Dg Chest Port 1 View  Result Date: 09/09/2016 CLINICAL DATA:  Respiratory failure EXAM: PORTABLE CHEST 1 VIEW COMPARISON:  09/08/2016 FINDINGS: Endotracheal tube and nasogastric catheter are again seen and stable. Right-sided chest wall port is again noted and stable. Cardiac shadow is within normal limits. The lungs are well aerated without focal infiltrate or sizable effusion. IMPRESSION: No acute abnormality noted. Electronically Signed   By: Inez Catalina M.D.   On: 09/09/2016 08:21    Assessment: 66 y.o.  Female admitted for seizure   1. New onset seizure, ? Secondary to hypomagnesemia, rule out brain metastasis or intracranial hemorrhage secondary to severe thrombocytopenia 2. Recurrent uterine and Ovarian cancer in 06/2016, currently on chemo Carbo and gemcitabine, last dose 08/31/16 2. Pancytopenia secondary to chemotherapy 3.Hypomagnesemia  4. DM with hyperglycemia   Plan:  - consider CT scan with and wo contrast if pt cannot tolerate MRI, to rule out CNS metastasis and bleeding  -daily CBC - Blood and plt transfusion as needed to keep Hb>8.0 and plt >10K -if she spikes fever, needs  broad antibiotics due to her neutropenia. Will hold on GCSF for now.  -I will inform Dr. Talbert Cage at [facility] and suggest significant chemo dose reduction for subsequent treatment or change regimen due to severe pancytopenia  -We will follow up in house as needed. Please call for questions. She will follow up with Dr. Talbert Cage after discharge.    This document serves as a record of services personally performed by Truitt Merle, MD. It was created on her behalf by Brandt Loosen, a trained medical scribe. The creation of this record is based on the scribe's personal observations and the provider's statements to them. This document has been checked and approved by the attending provider.   Truitt Merle, MD 09/10/2016  8:32 PM

## 2016-09-10 NOTE — Progress Notes (Signed)
Nutrition Follow-up  DOCUMENTATION CODES:   Morbid obesity  INTERVENTION:  Ensure Enlive BID between meals. Each supplement provides 350 kcal and 20 grams of protein.  NUTRITION DIAGNOSIS:   Inadequate oral intake related to  (Decreased appetite) as evidenced by per patient/family report.  Ongoing  GOAL:   Patient will meet greater than or equal to 90% of their needs  Progressing  MONITOR:   PO intake, Supplement acceptance, Labs  ASSESSMENT:   66 year old morbid obese female with ovarian cancer currently on chemotherapy. PMH significant of anemia, HTN, and DM . Patient went in on 3/6 for her second treatment of chemotherapy which was held. Patient was given a unit of PRBC and sent home. Later that evening, the patient fell in the bathroom and began seizing. Patient seized again 3 times in the ED and was intubated for airway protection.  Patient was extubated on 3/8. Patient was very lethargic during consult. She provided very short answers when asked questions. Stated no current nausea, vomiting, or abdominal pain. When asked if she felt like eating she responded, "Not really, but I can try." Patient reported appetite PTA had been good and that she consumed 3 meals/day but could not provide additional details.  Patient stated she had not lost any weight PTA. Patient reported her weight at [facility] was 256 lb, but today weight was reported at 266 lb. Patient stated her UBW is 256 lb and she did not feel that she had gained 10 pounds in 3 days.  Patient reported no current taste changes or any major side effects due to chemotherapy treatment.  Meds Reviewed: Novolog, Lantus, Aranesp,   Labs Reviewed: Calcium 7.8  Diet Order:  Diet Carb Modified Fluid consistency: Thin; Room service appropriate? Yes  Skin:  Reviewed, no issues  Last BM:  3/5  Height:   Ht Readings from Last 1 Encounters:  09/08/16 5\' 6"  (1.676 m)    Weight:   Wt Readings from Last 1 Encounters:   09/10/16 266 lb 5.1 oz (120.8 kg)    Ideal Body Weight:  59 kg  BMI:  Body mass index is 42.98 kg/m.  Estimated Nutritional Needs:   Kcal:  1700-1900  Protein:  95-115 grams  Fluid:  >/=1.5 L  EDUCATION NEEDS:   No education needs identified at this time  Juliann Pulse M.S. Nutrition Dietetic Intern

## 2016-09-10 NOTE — Evaluation (Signed)
Physical Therapy Evaluation Patient Details Name: Kathleen Whitehead MRN: [id number] DOB: 02/01/1951 Today's Date: 09/10/2016   History of Present Illness  Pt is 66 y.o. female who admitted to the ED on 09/08/16 for seizure-like activity at home and in ED. Intubated 3/7-3/8. Pt currently receiving chemo for ovarian cancer, with PMH also significant for obesity, peripheral neuropathy, PAD, HTN, DM.  Clinical Impression  Pt presents to PT with generalized weakness, increased pain, impaired balance, and an overall decrease in functional mobility secondary to above. PTA, pt indep with household amb and requires push in manual w/c for community amb; requires intermittent assist for ADLs/IADLs. Lives at home with husband and son who are able to provide 24-7 supervision if needed. Today, pt required max encouragement for participation with PT, able to pull to stand using back of chair and take a couple sidesteps with BUE support and minA. Pt would benefit from continued acute PT services to maximize functional mobility and independence.     Follow Up Recommendations Supervision for mobility/OOB;Home health PT    Equipment Recommendations  None recommended by PT    Recommendations for Other Services OT consult     Precautions / Restrictions Precautions Precautions: Fall Restrictions Weight Bearing Restrictions: No      Mobility  Bed Mobility Overal bed mobility: Needs Assistance Bed Mobility: Supine to Sit     Supine to sit: HOB elevated;Min assist     General bed mobility comments: Increased time for bed mob, minA for UE support pulling up to sit and scooting hips to EOB.   Transfers Overall transfer level: Needs assistance Equipment used:  (Back of chair) Transfers: Sit to/from Stand Sit to Stand: Mod assist         General transfer comment: ModA for standing to back of chair; pt using bed for BLE support.   Ambulation/Gait                Stairs            Wheelchair  Mobility    Modified Rankin (Stroke Patients Only)       Balance Overall balance assessment: Needs assistance Sitting-balance support: No upper extremity supported;Feet supported Sitting balance-Leahy Scale: Fair     Standing balance support: Bilateral upper extremity supported;During functional activity Standing balance-Leahy Scale: Poor Standing balance comment: Pt relying on BUE support for standing, able to take side steps towards HOB with BUE support and minA.                              Pertinent Vitals/Pain Pain Assessment: Faces Faces Pain Scale: Hurts even more Pain Location: Chronic soreness all over Pain Descriptors / Indicators: Sore Pain Intervention(s): Limited activity within patient's tolerance;Repositioned;Monitored during session    Home Living Family/patient expects to be discharged to:: Private residence Living Arrangements: Spouse/significant other;Children Available Help at Discharge: Family;Available 24 hours/day (Husband, son) Type of Home: House Home Access: Stairs to enter Entrance Stairs-Rails: Chemical engineer of Steps: 2 Home Layout: One level Home Equipment: Environmental consultant - 2 wheels;Wheelchair - manual      Prior Function Level of Independence: Needs assistance   Gait / Transfers Assistance Needed: Indep w/ household amb using furniture for balance secondary to BLE neuropathy. Community mobility w/ manual w/c, which husband pushes. Does not drive.   ADL's / Homemaking Assistance Needed: Able to bathe and dress, requiring intermittent assist for household tasks.        Hand Dominance  Extremity/Trunk Assessment   Upper Extremity Assessment Upper Extremity Assessment: Generalized weakness    Lower Extremity Assessment Lower Extremity Assessment: Generalized weakness;LLE deficits/detail;RLE deficits/detail RLE Sensation: decreased light touch;history of peripheral neuropathy (R foot) LLE Sensation:  decreased light touch;history of peripheral neuropathy (L foot)       Communication   Communication: No difficulties  Cognition Arousal/Alertness: Awake/alert Behavior During Therapy: WFL for tasks assessed/performed Overall Cognitive Status: Within Functional Limits for tasks assessed                      General Comments      Exercises     Assessment/Plan    PT Assessment Patient needs continued PT services  PT Problem List Decreased strength;Decreased mobility;Decreased activity tolerance;Decreased balance;Obesity;Pain       PT Treatment Interventions Gait training;Stair training;Functional mobility training;Therapeutic activities;Therapeutic exercise;Balance training;Patient/family education    PT Goals (Current goals can be found in the Care Plan section)  Acute Rehab PT Goals Patient Stated Goal: Return home PT Goal Formulation: With patient Time For Goal Achievement: 09/24/16 Potential to Achieve Goals: Good    Frequency Min 3X/week   Barriers to discharge        Co-evaluation               End of Session Equipment Utilized During Treatment: Gait belt Activity Tolerance: Patient limited by fatigue;Patient tolerated treatment well Patient left: in bed;with nursing/sitter in room;with call bell/phone within reach Nurse Communication: Mobility status PT Visit Diagnosis: Unsteadiness on feet (R26.81);Other abnormalities of gait and mobility (R26.89);Muscle weakness (generalized) (M62.81)         Time: 6578-4696 PT Time Calculation (min) (ACUTE ONLY): 16 min   Charges:   PT Evaluation $PT Eval Low Complexity: 1 Procedure     PT G Codes:       Enis Gash, SPT Office-[phone]  Mabeline Caras 09/10/2016, 1:45 PM

## 2016-09-11 LAB — BPAM RBC
Blood Product Expiration Date: 201803142359
Blood Product Expiration Date: 201803142359
ISSUE DATE / TIME: 201803061357
UNIT TYPE AND RH: 6200
Unit Type and Rh: 6200

## 2016-09-11 LAB — TYPE AND SCREEN
ABO/RH(D): A POS
Antibody Screen: NEGATIVE
UNIT DIVISION: 0
Unit division: 0

## 2016-09-11 LAB — CBC WITH DIFFERENTIAL/PLATELET
BASOS ABS: 0 10*3/uL (ref 0.0–0.1)
Basophils Relative: 0 %
EOS PCT: 0 %
Eosinophils Absolute: 0 10*3/uL (ref 0.0–0.7)
HCT: 22.5 % — ABNORMAL LOW (ref 36.0–46.0)
HEMOGLOBIN: 7.6 g/dL — AB (ref 12.0–15.0)
LYMPHS PCT: 29 %
Lymphs Abs: 1 10*3/uL (ref 0.7–4.0)
MCH: 29.9 pg (ref 26.0–34.0)
MCHC: 33.8 g/dL (ref 30.0–36.0)
MCV: 88.6 fL (ref 78.0–100.0)
Monocytes Absolute: 0.4 10*3/uL (ref 0.1–1.0)
Monocytes Relative: 11 %
Neutro Abs: 2.1 10*3/uL (ref 1.7–7.7)
Neutrophils Relative %: 60 %
Platelets: 29 10*3/uL — CL (ref 150–400)
RBC: 2.54 MIL/uL — AB (ref 3.87–5.11)
RDW: 15.4 % (ref 11.5–15.5)
WBC: 3.6 10*3/uL — AB (ref 4.0–10.5)

## 2016-09-11 LAB — GLUCOSE, CAPILLARY
GLUCOSE-CAPILLARY: 101 mg/dL — AB (ref 65–99)
GLUCOSE-CAPILLARY: 217 mg/dL — AB (ref 65–99)
GLUCOSE-CAPILLARY: 253 mg/dL — AB (ref 65–99)
GLUCOSE-CAPILLARY: 289 mg/dL — AB (ref 65–99)
Glucose-Capillary: 104 mg/dL — ABNORMAL HIGH (ref 65–99)
Glucose-Capillary: 324 mg/dL — ABNORMAL HIGH (ref 65–99)

## 2016-09-11 LAB — BASIC METABOLIC PANEL
ANION GAP: 9 (ref 5–15)
BUN: 9 mg/dL (ref 6–20)
CHLORIDE: 110 mmol/L (ref 101–111)
CO2: 21 mmol/L — ABNORMAL LOW (ref 22–32)
Calcium: 7.7 mg/dL — ABNORMAL LOW (ref 8.9–10.3)
Creatinine, Ser: 0.82 mg/dL (ref 0.44–1.00)
Glucose, Bld: 104 mg/dL — ABNORMAL HIGH (ref 65–99)
POTASSIUM: 3.9 mmol/L (ref 3.5–5.1)
SODIUM: 140 mmol/L (ref 135–145)

## 2016-09-11 LAB — PREPARE PLATELET PHERESIS: Unit division: 0

## 2016-09-11 LAB — PREPARE RBC (CROSSMATCH)

## 2016-09-11 LAB — BPAM PLATELET PHERESIS
BLOOD PRODUCT EXPIRATION DATE: 201803092359
ISSUE DATE / TIME: 201803091458
UNIT TYPE AND RH: 6200

## 2016-09-11 MED ORDER — SODIUM CHLORIDE 0.9 % IV SOLN: Freq: Once | INTRAVENOUS | Status: DC

## 2016-09-11 MED ORDER — ALTEPLASE 2 MG IJ SOLR
2.0000 mg | Freq: Once | INTRAMUSCULAR | Status: AC
  Administered 2016-09-11: 2 mg

## 2016-09-11 MED ORDER — PREDNISONE 50 MG PO TABS
50.0000 mg | ORAL_TABLET | ORAL | Status: AC
  Administered 2016-09-12 (×2): 50 mg via ORAL
  Filled 2016-09-11 (×2): qty 1

## 2016-09-11 MED ORDER — LORATADINE 10 MG PO TABS: 10.0000 mg | ORAL_TABLET | Freq: Once | ORAL | Status: DC

## 2016-09-11 MED ORDER — FAMOTIDINE IN NACL 20-0.9 MG/50ML-% IV SOLN: 20.0000 mg | Freq: Once | INTRAVENOUS | Status: DC

## 2016-09-11 MED ORDER — FAMOTIDINE IN NACL 20-0.9 MG/50ML-% IV SOLN
20.0000 mg | Freq: Once | INTRAVENOUS | Status: AC
Start: 2016-09-12 — End: 2016-09-12
  Administered 2016-09-12: 20 mg via INTRAVENOUS
  Filled 2016-09-11: qty 50

## 2016-09-11 MED ORDER — LORATADINE 10 MG PO TABS
10.0000 mg | ORAL_TABLET | Freq: Once | ORAL | Status: AC
Start: 2016-09-12 — End: 2016-09-12
  Administered 2016-09-12: 10 mg via ORAL
  Filled 2016-09-11: qty 1

## 2016-09-11 MED ORDER — PREDNISONE 50 MG PO TABS
50.0000 mg | ORAL_TABLET | Freq: Four times a day (QID) | ORAL | Status: DC
  Administered 2016-09-11 (×2): 50 mg via ORAL
  Filled 2016-09-11 (×2): qty 1

## 2016-09-11 MED ORDER — PANTOPRAZOLE SODIUM 40 MG PO TBEC
40.0000 mg | DELAYED_RELEASE_TABLET | Freq: Every day | ORAL | Status: DC
  Administered 2016-09-11: 40 mg via ORAL

## 2016-09-11 NOTE — Progress Notes (Signed)
PROGRESS NOTE    Kathleen Whitehead  IWL:[id number] DOB: 09/28/1950 DOA: 09/08/2016 PCP: [name], MD   Brief Narrative:  66 year old female with history of ovarian cancer currently on chemotherapy and diabetes came to the hospital after having seizure-like activity at home. Apparently she had 3 more seizures in the ER and was intubated for airway protection. Neurology was consulted who suggested getting EEG and starting her on Keppra. EEG showed diffuse slowing with possible absence seizure. He was recommended to continue Keppra. During her stay she was noted to be pancytopenic and was decided to transfuse her if her hemoglobin drops below 7 and platelets drops below 10,000. MRI of the brain was ordered to rule out any metastases with patient was unable to lay flat therefore CT scan with and without contrast was ordered. Patient is allergic to iodine therefore she is going to require prep.   Assessment & Plan:   Active Problems:   Status epilepticus ([place])   Acute respiratory failure ([place])   Encounter for orogastric (OG) tube placement   Encounter for intubation   Dyspnea  New-onset seizures -Unable to rule out any brain metastases. Creatinine from abnormal electrolytes as patient had low magnesium -Continue Keppra -CT of the head with and without contrast is ordered. She will get iodine and Benadryl prep per protocol. -Monitor electrolytes. Neurology following.  Recurrent uterine and ovarian cancer -Currently on [place] and gemcitabine, her last dose was 08/31/2016 -Follow-up with Dr. Talbert Cage after your discharge  Pancytopenia likely secondary to chemotherapy -Continue to monitor her hemoglobin and platelets at this time. Keep hemoglobin above 8 and platelets above 10,000. Low threshold for starting antibiotics given she is neutropenic. -No growth colony-stimulating factor at this time. Per Heme/onc -So far patient has received 2 units of PRBC and 1 unit of platelet. -Order 1 more unit  of plt today.   Diabetes type 2 -Accu-Cheks, sliding scale.  Patient is adamant about going home at this time but I think she needs to get a CT of the head done first to ensure there is no lesions causing her seizures. She also needs to be evaluated by physical therapy and have home PT arranged. Excellent she is allergic to iodine therefore she will get steroids and Benadryl prep prior to getting it.  DVT prophylaxis: SCDS Code Status: Full Family Communication:  Husband at bedside  Disposition Plan: TBD after CT head and PT eval. Likely discharge in next 24 hrs   Consultants:   onc  Procedures:   none  Antimicrobials:   none   Subjective: No complaints of the patient's morning. She is doing well but very weak. No seizure-like episodes overnight. She is adamant about leaving today but I have explained her the reason why she needs to stay back complete her evaluation. Her husband is present at bedside who understands what she needs to stay.  Objective: Vitals:   09/11/16 0800 09/11/16 0900 09/11/16 1000 09/11/16 1100  BP:  (!) 142/47 (!) 152/40   Pulse: 75 72 85 87  Resp: 14 14 15 15   Temp: 99.8 F (37.7 C)     TempSrc: Oral     SpO2: 98% 99% 98% 98%  Weight:      Height:        Intake/Output Summary (Last 24 hours) at 09/11/16 1156 Last data filed at 09/11/16 1000  Gross per 24 hour  Intake          2454.91 ml  Output  1000 ml  Net          1454.91 ml   Filed Weights   09/09/16 0500 09/10/16 0400 09/11/16 0600  Weight: 121 kg (266 lb 12.1 oz) 120.8 kg (266 lb 5.1 oz) 123.2 kg (271 lb 9.7 oz)    Examination:  General exam: Appears calm and comfortable; generalized weakness Respiratory system: Clear to auscultation. Respiratory effort normal. Cardiovascular system: S1 & S2 heard, RRR. No JVD, murmurs, rubs, gallops or clicks. No pedal edema. Gastrointestinal system: Abdomen is nondistended, soft and nontender. No organomegaly or masses felt. Normal  bowel sounds heard. Central nervous system: Alert and oriented. No focal neurological deficits. Extremities: Symmetric 5 x 5 power. Skin: No rashes, lesions or ulcers Psychiatry: Judgement and insight appear normal. Mood & affect appropriate.     Data Reviewed:   CBC:  Recent Labs Lab 09/07/16 1011  09/08/16 1206 09/09/16 0420 09/10/16 0631 09/10/16 2000 09/11/16 0610  WBC 1.6*  < > 4.1 5.3 4.5 4.0 3.6*  NEUTROABS 0.7*  --   --   --   --   --  2.1  HGB 8.4*  < > 7.6* 8.0* 6.7* 7.6* 7.6*  HCT 24.4*  < > 22.5* 23.6* 19.8* 22.4* 22.5*  MCV 92.4  < > 87.9 87.1 87.6 88.9 88.6  PLT 38*  < > 26* 17* 8* 30* 29*  < > = values in this interval not displayed. Basic Metabolic Panel:  Recent Labs Lab 09/07/16 1011 09/08/16 0239 09/08/16 0520 09/08/16 2330 09/09/16 0420 09/10/16 0631 09/11/16 0610  NA 134* 134* 135 136 135 141 140  K 4.6 5.2* 4.8 4.1 4.3 4.0 3.9  CL 102 104 104 109 108 114* 110  CO2 25 21* 21* 20* 19* 21* 21*  GLUCOSE 275* 397* 280* 190* 189* 118* 104*  BUN 17 23* 25* 23* 19 9 9   CREATININE 1.18* 1.49* 1.41* 0.99 0.98 0.80 0.82  CALCIUM 8.7* 8.5* 8.4* 7.8* 8.0* 7.8* 7.7*  MG 1.1* 2.2  --   --   --   --   --   PHOS 3.2 2.7  --   --   --   --   --    GFR: Estimated Creatinine Clearance: 89.2 mL/min (by C-G formula based on SCr of 0.82 mg/dL). Liver Function Tests:  Recent Labs Lab 09/07/16 1011 09/08/16 0239  AST 58* 70*  ALT 46 54  ALKPHOS 80 76  BILITOT 0.5 0.7  PROT 6.6 5.9*  ALBUMIN 3.0* 2.6*   No results for input(s): LIPASE, AMYLASE in the last 168 hours. No results for input(s): AMMONIA in the last 168 hours. Coagulation Profile:  Recent Labs Lab 09/08/16 0239  INR 1.05   Cardiac Enzymes: No results for input(s): CKTOTAL, CKMB, CKMBINDEX, TROPONINI in the last 168 hours. BNP (last 3 results) No results for input(s): PROBNP in the last 8760 hours. HbA1C: No results for input(s): HGBA1C in the last 72 hours. CBG:  Recent Labs Lab  09/10/16 1939 09/10/16 2315 09/11/16 0317 09/11/16 0806 09/11/16 1154  GLUCAP 139* 122* 101* 104* 217*   Lipid Profile:  Recent Labs  09/09/16 0420  TRIG 223*   Thyroid Function Tests: No results for input(s): TSH, T4TOTAL, FREET4, T3FREE, THYROIDAB in the last 72 hours. Anemia Panel:  Recent Labs  09/10/16 2300  FERRITIN 658*   Sepsis Labs:  Recent Labs Lab 09/08/16 0239 09/08/16 0847 09/09/16 1041  PROCALCITON 0.76  --   --   LATICACIDVEN 2.9* 2.6* 0.8  Recent Results (from the past 240 hour(s))  MRSA PCR Screening     Status: None   Collection Time: 09/08/16  1:27 AM  Result Value Ref Range Status   MRSA by PCR NEGATIVE NEGATIVE Final    Comment:        The GeneXpert MRSA Assay (FDA approved for NASAL specimens only), is one component of a comprehensive MRSA colonization surveillance program. It is not intended to diagnose MRSA infection nor to guide or monitor treatment for MRSA infections.   Culture, blood (routine x 2)     Status: None (Preliminary result)   Collection Time: 09/08/16  2:30 AM  Result Value Ref Range Status   Specimen Description BLOOD LEFT ANTECUBITAL  Final   Special Requests BOTTLES DRAWN AEROBIC ONLY 10CC  Final   Culture NO GROWTH 3 DAYS  Final   Report Status PENDING  Incomplete  Culture, blood (routine x 2)     Status: None (Preliminary result)   Collection Time: 09/08/16  2:40 AM  Result Value Ref Range Status   Specimen Description BLOOD LEFT HAND  Final   Special Requests IN PEDIATRIC BOTTLE 3CC  Final   Culture NO GROWTH 3 DAYS  Final   Report Status PENDING  Incomplete  Urine culture     Status: None   Collection Time: 09/08/16  3:35 AM  Result Value Ref Range Status   Specimen Description URINE, CATHETERIZED  Final   Special Requests NONE  Final   Culture NO GROWTH  Final   Report Status 09/09/2016 FINAL  Final  Culture, respiratory (tracheal aspirate)     Status: Abnormal   Collection Time: 09/08/16 10:12  AM  Result Value Ref Range Status   Specimen Description TRACHEAL ASPIRATE  Final   Special Requests NONE  Final   Gram Stain   Final    FEW WBC PRESENT, PREDOMINANTLY PMN FEW GRAM POSITIVE COCCI IN PAIRS RARE GRAM POSITIVE RODS    Culture MULTIPLE ORGANISMS PRESENT, NONE PREDOMINANT (A)  Final   Report Status 09/10/2016 FINAL  Final         Radiology Studies: Dg Chest Port 1 View  Result Date: 09/10/2016 CLINICAL DATA:  Hypertension, seizure, and ovarian cancer. EXAM: PORTABLE CHEST 1 VIEW COMPARISON:  09/09/2016 FINDINGS: Right-sided PowerPort tip overlies the level of the right atrium. The heart is enlarged. Lungs are clear. Endotracheal tube and nasogastric tube have been removed. IMPRESSION: No evidence for acute cardiopulmonary abnormality. Electronically Signed   By: Nolon Nations M.D.   On: 09/10/2016 09:09        Scheduled Meds: . Chlorhexidine Gluconate Cloth  6 each Topical Daily  . darbepoetin (ARANESP) injection - NON-DIALYSIS  200 mcg Subcutaneous Q Fri-1800  . famotidine (PEPCID) IV  20 mg Intravenous Once  . feeding supplement (ENSURE ENLIVE)  1 Bottle Oral BID BM  . insulin aspart  0-15 Units Subcutaneous Q4H  . insulin glargine  10 Units Subcutaneous Q24H  . levETIRAcetam  500 mg Intravenous Q12H  . loratadine  10 mg Oral Once  . pantoprazole (PROTONIX) IV  40 mg Intravenous QHS  . predniSONE  50 mg Oral Q6H   Continuous Infusions: . fentaNYL infusion INTRAVENOUS Stopped (09/08/16 0730)  . propofol (DIPRIVAN) infusion Stopped (09/09/16 1040)     LOS: 3 days    Time spent: 35 mins     Ankit Arsenio Loader, MD [facility] Pager [phone]   If 7PM-7AM, please contact night-coverage [email or web address] Password [email or web address] 09/11/2016, 11:56 AM

## 2016-09-12 ENCOUNTER — Inpatient Hospital Stay (HOSPITAL_COMMUNITY): Payer: Medicare HMO

## 2016-09-12 ENCOUNTER — Encounter (HOSPITAL_COMMUNITY): Payer: Self-pay | Admitting: *Deleted

## 2016-09-12 LAB — TYPE AND SCREEN
ABO/RH(D): A POS
ANTIBODY SCREEN: NEGATIVE
UNIT DIVISION: 0
UNIT DIVISION: 0
Unit division: 0

## 2016-09-12 LAB — CBC
HEMATOCRIT: 29.1 % — AB (ref 36.0–46.0)
HEMATOCRIT: 27 % — AB (ref 36.0–46.0)
HEMOGLOBIN: 9.7 g/dL — AB (ref 12.0–15.0)
Hemoglobin: 9.1 g/dL — ABNORMAL LOW (ref 12.0–15.0)
MCH: 29.7 pg (ref 26.0–34.0)
MCH: 29.5 pg (ref 26.0–34.0)
MCHC: 33.3 g/dL (ref 30.0–36.0)
MCHC: 33.7 g/dL (ref 30.0–36.0)
MCV: 89 fL (ref 78.0–100.0)
MCV: 87.7 fL (ref 78.0–100.0)
Platelets: 27 10*3/uL — CL (ref 150–400)
Platelets: 22 10*3/uL — CL (ref 150–400)
RBC: 3.27 MIL/uL — ABNORMAL LOW (ref 3.87–5.11)
RBC: 3.08 MIL/uL — ABNORMAL LOW (ref 3.87–5.11)
RDW: 14.7 % (ref 11.5–15.5)
RDW: 14.4 % (ref 11.5–15.5)
WBC: 2.7 10*3/uL — ABNORMAL LOW (ref 4.0–10.5)
WBC: 2.6 10*3/uL — ABNORMAL LOW (ref 4.0–10.5)

## 2016-09-12 LAB — GLUCOSE, CAPILLARY
GLUCOSE-CAPILLARY: 198 mg/dL — AB (ref 65–99)
Glucose-Capillary: 216 mg/dL — ABNORMAL HIGH (ref 65–99)
Glucose-Capillary: 327 mg/dL — ABNORMAL HIGH (ref 65–99)

## 2016-09-12 LAB — BPAM RBC
Blood Product Expiration Date: 201803282359
Blood Product Expiration Date: 201803292359
Blood Product Expiration Date: 201803302359
ISSUE DATE / TIME: 201803091220
ISSUE DATE / TIME: 201803101255
ISSUE DATE / TIME: 201803071609
Unit Type and Rh: 6200
Unit Type and Rh: 6200
Unit Type and Rh: 6200

## 2016-09-12 LAB — TRIGLYCERIDES: TRIGLYCERIDES: 108 mg/dL (ref <150)

## 2016-09-12 MED ORDER — HEPARIN SOD (PORK) LOCK FLUSH 100 UNIT/ML IV SOLN
500.0000 [IU] | INTRAVENOUS | Status: DC
  Filled 2016-09-12: qty 5

## 2016-09-12 MED ORDER — HEPARIN SOD (PORK) LOCK FLUSH 100 UNIT/ML IV SOLN
500.0000 [IU] | INTRAVENOUS | Status: DC | PRN
  Administered 2016-09-12: 500 [IU]
  Filled 2016-09-12 (×2): qty 5

## 2016-09-12 MED ORDER — LEVETIRACETAM 500 MG PO TABS: 500.0000 mg | ORAL_TABLET | Freq: Two times a day (BID) | ORAL | 0 refills | Status: DC

## 2016-09-12 MED ORDER — IOPAMIDOL (ISOVUE-300) INJECTION 61%
INTRAVENOUS | Status: AC
  Administered 2016-09-12: 75 mL
  Filled 2016-09-12: qty 75

## 2016-09-12 NOTE — Progress Notes (Signed)
Claudette Head to be D/C'd  Home per MD order.  Discussed with the patient and all questions fully answered.  VSS, Skin clean, dry and intact without evidence of skin break down, no evidence of skin tears noted. IV catheter discontinued intact. Porta Cath deaccessed and heparinized per protocol.Site without signs and symptoms of complications. Dressing and pressure applied.  An After Visit Summary was printed and given to the patient. Patient received prescription.  D/c education completed with patient/family including follow up instructions, medication list, d/c activities limitations if indicated, with other d/c instructions as indicated by MD - patient able to verbalize understanding, all questions fully answered.   Patient instructed to return to ED, call 911, or call MD for any changes in condition.   Patient escorted via [place], and D/C home via private auto.  Milas Hock 09/12/2016 4:01 PM

## 2016-09-12 NOTE — Discharge Summary (Addendum)
Physician Discharge Summary  Kathleen Whitehead HWE:[id number] DOB: [birth year] DOA: 09/08/2016  PCP: [name], MD  Admit date: 09/08/2016 Discharge date: 09/12/2016  Admitted From:Home Disposition: Home  Recommendations for Outpatient Follow-up:  1. Follow up with PCP in 2 weeks 2. Follow up with oncology Dr. Talbert Cage on 09/14/2016 3. Please get BMP and CBC checked for pancytopenia during her visit at your oncology clinic. 4. Continue Keppra 500 mg twice a day. And follow-up neurology in 2-4 weeks.   Home Health:No; patient refused home physical therapy. nt/Devices: None   Discharge Condition:Stable CODE STATUS: Full  Diet recommendation: Heart Healthy / Carb Modified   Brief/Interim Summary:  66 year old female with past medical history of cancer and diabetes comes to the hospital seizure-like activity. Due to status epilepticus she was intubated for airway protection. Neurology was consulted who performed EEG and she was placed on Keppra. EEG did not reveal any significant seizure-like activity and showed diffuse slowing. During her stay she was also noted to be pancytopenic likely from her chemotherapy therefore she was transfused 3 units of PRBC and 1 unit of platelet. Oncology was following the patient as well. She was extubated without any issues and transferred to stepdown unit and subsequently to medical floor. She did not have any further seizure-like activities. We did want to get MRI of the brain but patient was unable to tolerate it therefore it was decided to get CT of the head with and without contrast. CT of the head was negative for any acute pathology and concerns of any malignancy. Prior to getting CT of the head she was premedicated with prednisone and Benadryl given her history of iodine allergy. She was also evaluated by physical therapy who recommended home PT with patient refused. Today she has reached maximum benefit from her inpatient hospital stay he can be discharged with  outpatient follow-up with primary care physician, oncology and neurology. She is also advised to get repeat BMP and CBC when she visits her oncologist on 09/14/2016. She is discharged today in stable condition.    Discharge Diagnoses:  Active Problems:   Status epilepticus ([place])   Acute respiratory failure ([place])   Encounter for orogastric (OG) tube placement   Encounter for intubation   Dyspnea  New-onset seizures-controlled/resolved -Unable to rule out any brain metastases. Creatinine from abnormal electrolytes as patient had low magnesium -Continue Keppra and follow up with outpatient neurology in 2-4 weeks. -CT of the head with and without contrast done and no intracranial metastases or acute pathology is noted. -Monitor electrolytes.  Recurrent uterine and ovarian cancer -Currently on [place] and gemcitabine, her last dose was 08/31/2016. Cont to follow outpatient.  -Follow-up with Dr. Talbert Cage after your discharge. She has an appointment on 09/14/2016.  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 Per Heme/onc -So far patient has received 3 units of PRBC and 1 unit of platelet. -I have advised her to get outpatient CBC done on 09/14/2016 when she follows up with Dr. Talbert Cage.  Diabetes type 2 -Home regimen can be restarted  Discharge Instructions   Allergies as of 09/12/2016      Reactions   Diphenhydramine Palpitations   Iodinated Diagnostic Agents Other (See Comments)   Unknown   Duloxetine Hcl Nausea And Vomiting   Ibuprofen Other (See Comments)   Makes my bones hurt   Penicillins Rash      Medication List    TAKE these medications   aspirin EC 325 MG tablet Take 325 mg  by mouth daily.   cetirizine 10 MG tablet Commonly known as:  ZYRTEC Take 10 mg by mouth daily.   fentaNYL 12 MCG/HR Commonly known as:  DURAGESIC - dosed mcg/hr Place 12 mcg onto the skin every 3 (three) days.   Fish Oil 1000 MG Caps Take  1,000 mg by mouth daily.   glipiZIDE 10 MG tablet Commonly known as:  GLUCOTROL TAKE 1 TABLET BY MOUTH TWICE A DAY   levETIRAcetam 500 MG tablet Commonly known as:  KEPPRA Take 1 tablet (500 mg total) by mouth 2 (two) times daily.   lidocaine-prilocaine cream Commonly known as:  EMLA Apply a quarter size amount to affected area 1 hour prior to coming to chemotherapy.   lisinopril 40 MG tablet Commonly known as:  PRINIVIL,ZESTRIL Take 40 mg by mouth daily.   loperamide 2 MG capsule Commonly known as:  IMODIUM Take 2 mg by mouth as needed for diarrhea or loose stools.   metFORMIN 1000 MG tablet Commonly known as:  GLUCOPHAGE Take 1,000 mg by mouth 2 (two) times daily with a meal.   MIRALAX PO Take 17 g by mouth daily as needed for moderate constipation.   ondansetron 4 MG disintegrating tablet Commonly known as:  ZOFRAN-ODT Take 4 mg by mouth every 8 (eight) hours as needed for nausea or vomiting.   ondansetron 8 MG tablet Commonly known as:  ZOFRAN Take 1 tablet (8 mg total) by mouth every 8 (eight) hours as needed for nausea or vomiting.   oxyCODONE-acetaminophen 10-325 MG tablet Commonly known as:  PERCOCET Take 1 tablet by mouth every 4 (four) hours as needed for pain.   prochlorperazine 10 MG tablet Commonly known as:  COMPAZINE Take 1 tablet (10 mg total) by mouth every 6 (six) hours as needed for nausea or vomiting.      Follow-up [facility] Follow up.   Why:  home health agency Contact information: [address] [address] [phone]        Darrel Reach, MD. Call in 3 week(s).   Specialty:  Neurology Contact information: [place] [address] [phone]        Twana First, MD Follow up in 2 day(s).   Specialty:  Oncology Contact information: [place] [address] [phone]        [name], MD Follow up in 2 week(s).    Specialty:  Family Medicine Contact information: [address] [place] [address] [phone]          Allergies  Allergen Reactions  . Diphenhydramine Palpitations  . Iodinated Diagnostic Agents Other (See Comments)    Unknown  . Duloxetine Hcl Nausea And Vomiting  . Ibuprofen Other (See Comments)    Makes my bones hurt  . Penicillins Rash    Consultations:  Neurology, Dr Shon Hale  Oncology; Dr Burr Medico   Procedures/Studies: Ct Head W & Wo Contrast  Result Date: 09/12/2016 CLINICAL DATA:  66 year old female with recurrent ovarian cancer on chemotherapy. Fall with seizure like activity on 09/07/2016. Transferred from [place]: CT HEAD WITHOUT AND WITH CONTRAST TECHNIQUE: Contiguous axial images were obtained from the base of the skull through the vertex without and with intravenous contrast CONTRAST:  83mL ISOVUE-300 IOPAMIDOL (ISOVUE-300) INJECTION 61% COMPARISON:  [facility] noncontrast head CT 09/07/2016. FINDINGS: Brain: No midline shift, ventriculomegaly, mass effect, evidence of mass lesion, intracranial hemorrhage  or evidence of cortically based acute infarction. Gray-white matter differentiation is within normal limits throughout the brain. No abnormal enhancement identified. Vascular: Calcified atherosclerosis at the skull base. Skull: No osseous abnormality identified. Sinuses/Orbits: Visualized paranasal sinuses and mastoids are stable and well pneumatized. Other: Visualized orbits and scalp soft tissues are within normal limits. IMPRESSION: No metastatic disease or acute intracranial abnormality. Normal for age CT appearance of the brain. Electronically Signed   By: Genevie Ann M.D.   On: 09/12/2016 10:19   Dg Chest Port 1 View  Result Date: 09/10/2016 CLINICAL DATA:  Hypertension, seizure, and ovarian cancer. EXAM: PORTABLE CHEST 1 VIEW COMPARISON:  09/09/2016 FINDINGS: Right-sided PowerPort tip overlies the level of the right atrium. The  heart is enlarged. Lungs are clear. Endotracheal tube and nasogastric tube have been removed. IMPRESSION: No evidence for acute cardiopulmonary abnormality. Electronically Signed   By: Nolon Nations M.D.   On: 09/10/2016 09:09   Dg Chest Port 1 View  Result Date: 09/09/2016 CLINICAL DATA:  Respiratory failure EXAM: PORTABLE CHEST 1 VIEW COMPARISON:  09/08/2016 FINDINGS: Endotracheal tube and nasogastric catheter are again seen and stable. Right-sided chest wall port is again noted and stable. Cardiac shadow is within normal limits. The lungs are well aerated without focal infiltrate or sizable effusion. IMPRESSION: No acute abnormality noted. Electronically Signed   By: Inez Catalina M.D.   On: 09/09/2016 08:21   Dg Chest Port 1 View  Result Date: 09/08/2016 CLINICAL DATA:  Encounter for orogastric tube placement and endotracheal tube position. EXAM: PORTABLE CHEST 1 VIEW COMPARISON:  None. FINDINGS: The patient is slightly rotated on current exam. Endotracheal tube tip terminates 4.2 cm above the carina in satisfactory position. Gastric tube is seen below the left hemidiaphragm in the expected location of the stomach. There is mild elevation of the right hemidiaphragm. No pulmonary consolidation, effusion or pneumothorax. Port catheter is noted with tip at the cavoatrial junction. No acute nor suspicious osseous lesions. IMPRESSION: Satisfactory support line and tube positions.  Clear lungs. Electronically Signed   By: Ashley Royalty M.D.   On: 09/08/2016 02:20   Dg Abd Portable 1v  Result Date: 09/08/2016 CLINICAL DATA:  Gastric tube placement EXAM: PORTABLE ABDOMEN - 1 VIEW COMPARISON:  None. FINDINGS: The tip of a gastric tube projects over the region of the duodenum bulb. The side-port is seen in the pre-pyloric region of the stomach. The bowel gas pattern is unremarkable. Mild elevation the right hemidiaphragm. IMPRESSION: The tip of a gastric tube projects over the region of the duodenal bulb with  side-port in the pre-pyloric region of the stomach. Electronically Signed   By: Ashley Royalty M.D.   On: 09/08/2016 02:23      Subjective:   Discharge Exam: Vitals:   09/11/16 2112 09/12/16 0533  BP: (!) 153/44 (!) 146/45  Pulse: 72 64  Resp: 18 18  Temp: 99.1 F (37.3 C) 98.1 F (36.7 C)   Vitals:   09/11/16 1849 09/11/16 2112 09/12/16 0525 09/12/16 0533  BP: (!) 155/51 (!) 153/44  (!) 146/45  Pulse: 75 72  64  Resp: 17 18  18   Temp: 98.4 F (36.9 C) 99.1 F (37.3 C)  98.1 F (36.7 C)  TempSrc: Oral Oral  Oral  SpO2: 99% 97%  98%  Weight:   121.6 kg (268 lb 1.6 oz)   Height:        General: Pt is alert, awake, not in acute distress Cardiovascular: RRR, S1/S2 +, no rubs, no  gallops Respiratory: CTA bilaterally, no wheezing, no rhonchi Abdominal: Soft, NT, ND, bowel sounds + Extremities: no edema, no cyanosis    The results of significant diagnostics from this hospitalization (including imaging, microbiology, ancillary and laboratory) are listed below for reference.     Microbiology: Recent Results (from the past 240 hour(s))  MRSA PCR Screening     Status: None   Collection Time: 09/08/16  1:27 AM  Result Value Ref Range Status   MRSA by PCR NEGATIVE NEGATIVE Final    Comment:        The GeneXpert MRSA Assay (FDA approved for NASAL specimens only), is one component of a comprehensive MRSA colonization surveillance program. It is not intended to diagnose MRSA infection nor to guide or monitor treatment for MRSA infections.   Culture, blood (routine x 2)     Status: None (Preliminary result)   Collection Time: 09/08/16  2:30 AM  Result Value Ref Range Status   Specimen Description BLOOD LEFT ANTECUBITAL  Final   Special Requests BOTTLES DRAWN AEROBIC ONLY 10CC  Final   Culture NO GROWTH 3 DAYS  Final   Report Status PENDING  Incomplete  Culture, blood (routine x 2)     Status: None (Preliminary result)   Collection Time: 09/08/16  2:40 AM  Result  Value Ref Range Status   Specimen Description BLOOD LEFT HAND  Final   Special Requests IN PEDIATRIC BOTTLE 3CC  Final   Culture NO GROWTH 3 DAYS  Final   Report Status PENDING  Incomplete  Urine culture     Status: None   Collection Time: 09/08/16  3:35 AM  Result Value Ref Range Status   Specimen Description URINE, CATHETERIZED  Final   Special Requests NONE  Final   Culture NO GROWTH  Final   Report Status 09/09/2016 FINAL  Final  Culture, respiratory (tracheal aspirate)     Status: Abnormal   Collection Time: 09/08/16 10:12 AM  Result Value Ref Range Status   Specimen Description TRACHEAL ASPIRATE  Final   Special Requests NONE  Final   Gram Stain   Final    FEW WBC PRESENT, PREDOMINANTLY PMN FEW GRAM POSITIVE COCCI IN PAIRS RARE GRAM POSITIVE RODS    Culture MULTIPLE ORGANISMS PRESENT, NONE PREDOMINANT (A)  Final   Report Status 09/10/2016 FINAL  Final     Labs: BNP (last 3 results)  Recent Labs  09/08/16 0239  BNP 65.4   Basic Metabolic Panel:  Recent Labs Lab 09/07/16 1011 09/08/16 0239 09/08/16 0520 09/08/16 2330 09/09/16 0420 09/10/16 0631 09/11/16 0610  NA 134* 134* 135 136 135 141 140  K 4.6 5.2* 4.8 4.1 4.3 4.0 3.9  CL 102 104 104 109 108 114* 110  CO2 25 21* 21* 20* 19* 21* 21*  GLUCOSE 275* 397* 280* 190* 189* 118* 104*  BUN 17 23* 25* 23* 19 9 9   CREATININE 1.18* 1.49* 1.41* 0.99 0.98 0.80 0.82  CALCIUM 8.7* 8.5* 8.4* 7.8* 8.0* 7.8* 7.7*  MG 1.1* 2.2  --   --   --   --   --   PHOS 3.2 2.7  --   --   --   --   --    Liver Function Tests:  Recent Labs Lab 09/07/16 1011 09/08/16 0239  AST 58* 70*  ALT 46 54  ALKPHOS 80 76  BILITOT 0.5 0.7  PROT 6.6 5.9*  ALBUMIN 3.0* 2.6*   No results for input(s): LIPASE, AMYLASE in the last 168  hours. No results for input(s): AMMONIA in the last 168 hours. CBC:  Recent Labs Lab 09/07/16 1011  09/10/16 0631 09/10/16 2000 09/11/16 0610 09/11/16 1802 09/12/16 0450  WBC 1.6*  < > 4.5 4.0 3.6*  2.7* 2.6*  NEUTROABS 0.7*  --   --   --  2.1  --   --   HGB 8.4*  < > 6.7* 7.6* 7.6* 9.7* 9.1*  HCT 24.4*  < > 19.8* 22.4* 22.5* 29.1* 27.0*  MCV 92.4  < > 87.6 88.9 88.6 89.0 87.7  PLT 38*  < > 8* 30* 29* 27* 22*  < > = values in this interval not displayed. Cardiac Enzymes: No results for input(s): CKTOTAL, CKMB, CKMBINDEX, TROPONINI in the last 168 hours. BNP: Invalid input(s): POCBNP CBG:  Recent Labs Lab 09/11/16 2115 09/11/16 2347 09/12/16 0432 09/12/16 0830 09/12/16 1230  GLUCAP 324* 289* 198* 216* 327*   D-Dimer No results for input(s): DDIMER in the last 72 hours. Hgb A1c No results for input(s): HGBA1C in the last 72 hours. Lipid Profile  Recent Labs  09/12/16 0450  TRIG 108   Thyroid function studies No results for input(s): TSH, T4TOTAL, T3FREE, THYROIDAB in the last 72 hours.  Invalid input(s): FREET3 Anemia work up  Recent Labs  09/10/16 2300  FERRITIN 658*   Urinalysis    Component Value Date/Time   COLORURINE YELLOW 09/08/2016 1743   APPEARANCEUR TURBID (A) 09/08/2016 1743   LABSPEC 1.030 09/08/2016 1743   PHURINE 5.0 09/08/2016 1743   GLUCOSEU NEGATIVE 09/08/2016 1743   HGBUR LARGE (A) 09/08/2016 1743   BILIRUBINUR SMALL (A) 09/08/2016 1743   KETONESUR NEGATIVE 09/08/2016 1743   PROTEINUR 100 (A) 09/08/2016 1743   NITRITE POSITIVE (A) 09/08/2016 1743   LEUKOCYTESUR TRACE (A) 09/08/2016 1743   Sepsis Labs Invalid input(s): PROCALCITONIN,  WBC,  LACTICIDVEN Microbiology Recent Results (from the past 240 hour(s))  MRSA PCR Screening     Status: None   Collection Time: 09/08/16  1:27 AM  Result Value Ref Range Status   MRSA by PCR NEGATIVE NEGATIVE Final    Comment:        The GeneXpert MRSA Assay (FDA approved for NASAL specimens only), is one component of a comprehensive MRSA colonization surveillance program. It is not intended to diagnose MRSA infection nor to guide or monitor treatment for MRSA infections.   Culture,  blood (routine x 2)     Status: None (Preliminary result)   Collection Time: 09/08/16  2:30 AM  Result Value Ref Range Status   Specimen Description BLOOD LEFT ANTECUBITAL  Final   Special Requests BOTTLES DRAWN AEROBIC ONLY 10CC  Final   Culture NO GROWTH 3 DAYS  Final   Report Status PENDING  Incomplete  Culture, blood (routine x 2)     Status: None (Preliminary result)   Collection Time: 09/08/16  2:40 AM  Result Value Ref Range Status   Specimen Description BLOOD LEFT HAND  Final   Special Requests IN PEDIATRIC BOTTLE 3CC  Final   Culture NO GROWTH 3 DAYS  Final   Report Status PENDING  Incomplete  Urine culture     Status: None   Collection Time: 09/08/16  3:35 AM  Result Value Ref Range Status   Specimen Description URINE, CATHETERIZED  Final   Special Requests NONE  Final   Culture NO GROWTH  Final   Report Status 09/09/2016 FINAL  Final  Culture, respiratory (tracheal aspirate)     Status: Abnormal  Collection Time: 09/08/16 10:12 AM  Result Value Ref Range Status   Specimen Description TRACHEAL ASPIRATE  Final   Special Requests NONE  Final   Gram Stain   Final    FEW WBC PRESENT, PREDOMINANTLY PMN FEW GRAM POSITIVE COCCI IN PAIRS RARE GRAM POSITIVE RODS    Culture MULTIPLE ORGANISMS PRESENT, NONE PREDOMINANT (A)  Final   Report Status 09/10/2016 FINAL  Final     Time coordinating discharge: Over 30 minutes  SIGNED:   Damita Lack, MD  [facility] 09/12/2016, 3:18 PM Pager   If 7PM-7AM, please contact night-coverage [email or web address] Password [email or web address]

## 2016-09-12 NOTE — Progress Notes (Signed)
CTH with and without contrast finally done today. I have reviewed the radiologist's interpretation. I have also independently reviewed the scan myself. This shows mild diffuse atrophy with mild chronic small vessel ischemic disease. No masses, no abnormal enhancement. No acute pathology.   No further seizures reported.   Impression: 1.  New onset seizure, etiology uncertain. Had mildly low magnesium and mild hyperglycemia on presentation but derangements not particularly severe. No structural pathology on CTH.   Recs: 1. Continue Keppra 2. F/u with outpatient neuro 2-4 weeks after discharge.   No additional recs, will sign off. Please call if any new issues arise.

## 2016-09-12 NOTE — Care Management Note (Signed)
Case Management Note  Patient Details  Name: Joretta Eads MRN: [id number] Date of Birth: 1950/08/06  Subjective/Objective:                  New onset seizure, Action/Plan: discharge planning Expected Discharge Date:  09/12/16               Expected Discharge Plan:  [place]  In-House Referral:     Discharge planning Services  CM Consult  Post Acute Care Choice:  Home Health Choice offered to:  Patient  DME Arranged:  N/A DME Agency:  NA  HH Arranged:  PT, Patient Refused [facility] Agency:  NA  Status of Service:  Completed, signed off  If discussed at [place] of Stay Meetings, dates discussed:    Additional Comments: Cm met with pt in room to offer choice of home health agency. Pt refuses HHPT recommendation.  No other CM needs were communicated. Dellie Catholic, RN 09/12/2016, 2:09 PM

## 2016-09-12 NOTE — Progress Notes (Signed)
PROGRESS NOTE    Kathleen Whitehead  HYQ:[id number] DOB: Nov 19, 1950 DOA: 09/08/2016 PCP: Juliette Alcide, MD   Brief Narrative:  66 year old female with history of ovarian cancer currently on chemotherapy and diabetes came to the hospital after having seizure-like activity at home. Apparently she had 3 more seizures in the ER and was intubated for airway protection. Neurology was consulted who suggested getting EEG and starting her on Keppra. EEG showed diffuse slowing with possible absence seizure. He was recommended to continue Keppra. During her stay she was noted to be pancytopenic and was decided to transfuse her if her hemoglobin drops below 7 and platelets drops below 10,000. MRI of the brain was ordered to rule out any metastases with patient was unable to lay flat therefore CT scan with and without contrast was ordered. Patient is allergic to iodine therefore she is going to require prep.   Assessment & Plan:   Active Problems:   Status epilepticus (HCC)   Acute respiratory failure (HCC)   Encounter for orogastric (OG) tube placement   Encounter for intubation   Dyspnea  New-onset seizures-controlled/resolved -Unable to rule out any brain metastases. Creatinine from abnormal electrolytes as patient had low magnesium -Continue Keppra and follow up with outpatient neurology in 2-4 weeks. -CT of the head with and without contrast done and no intracranial metastases or acute pathology is noted. -Monitor electrolytes.  Recurrent uterine and ovarian cancer -Currently on [place] and gemcitabine, her last dose was 08/31/2016 -Follow-up with Dr. Janyth Contes after your discharge. She has an appointment on 09/14/2016.  Pancytopenia likely secondary to chemotherapy -Continue to monitor her hemoglobin and platelets at this time. Keep hemoglobin above 8 and platelets above 10,000. Low threshold for starting antibiotics given she is neutropenic. -No growth colony-stimulating factor at this time. Per  Heme/onc -So far patient has received 3 units of PRBC and 1 unit of platelet. -I have advised her to get outpatient CBC done on 09/14/2016 when she follows up with Dr. Janyth Contes.   Diabetes type 2 -Accu-Cheks, sliding scale. -Home regimen can be started  Patient was evaluated by physical therapy and recommended home PT but patient refused at this time and wants to go home. Her husband states to have available equipment for her needs at home  DVT prophylaxis: SCDS Code Status: Full Family Communication:  Patient comprehends well Disposition Plan: We'll discharge today  Consultants:   onc  Neurology  Procedures:   none  Antimicrobials:   none   Subjective: Patient appears much more energetic today. She sitting up and eating her lunch without any complaints. I offered her home physical therapy but she is refusing at this time. She states she'll follow up with her oncology on 09/14/2016 showed given an appointment on that day. She understands she needs to get a CBC checked at that time.  Objective: Vitals:   09/11/16 1849 09/11/16 2112 09/12/16 0525 09/12/16 0533  BP: (!) 155/51 (!) 153/44  (!) 146/45  Pulse: 75 72  64  Resp: 17 18  18   Temp: 98.4 F (36.9 C) 99.1 F (37.3 C)  98.1 F (36.7 C)  TempSrc: Oral Oral  Oral  SpO2: 99% 97%  98%  Weight:   121.6 kg (268 lb 1.6 oz)   Height:        Intake/Output Summary (Last 24 hours) at 09/12/16 1353 Last data filed at 09/11/16 1800  Gross per 24 hour  Intake              625 ml  Output              575 ml  Net               50 ml   Filed Weights   09/11/16 0600 09/11/16 1846 09/12/16 0525  Weight: 123.2 kg (271 lb 9.7 oz) 120.8 kg (266 lb 4.8 oz) 121.6 kg (268 lb 1.6 oz)    Examination:  General exam: Appears calm and comfortable; generalized weakness Respiratory system: Clear to auscultation. Respiratory effort normal. Cardiovascular system: S1 & S2 heard, RRR. No JVD, murmurs, rubs, gallops or clicks. No pedal  edema. Gastrointestinal system: Abdomen is nondistended, soft and nontender. No organomegaly or masses felt. Normal bowel sounds heard. Central nervous system: Alert and oriented. No focal neurological deficits. Extremities: Symmetric 5 x 5 power. Skin: No rashes, lesions or ulcers Psychiatry: Judgement and insight appear normal. Mood & affect appropriate.     Data Reviewed:   CBC:  Recent Labs Lab 09/07/16 1011  09/10/16 0631 09/10/16 2000 09/11/16 0610 09/11/16 1802 09/12/16 0450  WBC 1.6*  < > 4.5 4.0 3.6* 2.7* 2.6*  NEUTROABS 0.7*  --   --   --  2.1  --   --   HGB 8.4*  < > 6.7* 7.6* 7.6* 9.7* 9.1*  HCT 24.4*  < > 19.8* 22.4* 22.5* 29.1* 27.0*  MCV 92.4  < > 87.6 88.9 88.6 89.0 87.7  PLT 38*  < > 8* 30* 29* 27* 22*  < > = values in this interval not displayed. Basic Metabolic Panel:  Recent Labs Lab 09/07/16 1011 09/08/16 0239 09/08/16 0520 09/08/16 2330 09/09/16 0420 09/10/16 0631 09/11/16 0610  NA 134* 134* 135 136 135 141 140  K 4.6 5.2* 4.8 4.1 4.3 4.0 3.9  CL 102 104 104 109 108 114* 110  CO2 25 21* 21* 20* 19* 21* 21*  GLUCOSE 275* 397* 280* 190* 189* 118* 104*  BUN 17 23* 25* 23* 19 9 9   CREATININE 1.18* 1.49* 1.41* 0.99 0.98 0.80 0.82  CALCIUM 8.7* 8.5* 8.4* 7.8* 8.0* 7.8* 7.7*  MG 1.1* 2.2  --   --   --   --   --   PHOS 3.2 2.7  --   --   --   --   --    GFR: Estimated Creatinine Clearance: 92.9 mL/min (by C-G formula based on SCr of 0.82 mg/dL). Liver Function Tests:  Recent Labs Lab 09/07/16 1011 09/08/16 0239  AST 58* 70*  ALT 46 54  ALKPHOS 80 76  BILITOT 0.5 0.7  PROT 6.6 5.9*  ALBUMIN 3.0* 2.6*   No results for input(s): LIPASE, AMYLASE in the last 168 hours. No results for input(s): AMMONIA in the last 168 hours. Coagulation Profile:  Recent Labs Lab 09/08/16 0239  INR 1.05   Cardiac Enzymes: No results for input(s): CKTOTAL, CKMB, CKMBINDEX, TROPONINI in the last 168 hours. BNP (last 3 results) No results for input(s):  PROBNP in the last 8760 hours. HbA1C: No results for input(s): HGBA1C in the last 72 hours. CBG:  Recent Labs Lab 09/11/16 2115 09/11/16 2347 09/12/16 0432 09/12/16 0830 09/12/16 1230  GLUCAP 324* 289* 198* 216* 327*   Lipid Profile:  Recent Labs  09/12/16 0450  TRIG 108   Thyroid Function Tests: No results for input(s): TSH, T4TOTAL, FREET4, T3FREE, THYROIDAB in the last 72 hours. Anemia Panel:  Recent Labs  09/10/16 2300  FERRITIN 658*   Sepsis Labs:  Recent Labs Lab 09/08/16 0239 09/08/16  2355 09/09/16 1041  PROCALCITON 0.76  --   --   LATICACIDVEN 2.9* 2.6* 0.8    Recent Results (from the past 240 hour(s))  MRSA PCR Screening     Status: None   Collection Time: 09/08/16  1:27 AM  Result Value Ref Range Status   MRSA by PCR NEGATIVE NEGATIVE Final    Comment:        The GeneXpert MRSA Assay (FDA approved for NASAL specimens only), is one component of a comprehensive MRSA colonization surveillance program. It is not intended to diagnose MRSA infection nor to guide or monitor treatment for MRSA infections.   Culture, blood (routine x 2)     Status: None (Preliminary result)   Collection Time: 09/08/16  2:30 AM  Result Value Ref Range Status   Specimen Description BLOOD LEFT ANTECUBITAL  Final   Special Requests BOTTLES DRAWN AEROBIC ONLY 10CC  Final   Culture NO GROWTH 3 DAYS  Final   Report Status PENDING  Incomplete  Culture, blood (routine x 2)     Status: None (Preliminary result)   Collection Time: 09/08/16  2:40 AM  Result Value Ref Range Status   Specimen Description BLOOD LEFT HAND  Final   Special Requests IN PEDIATRIC BOTTLE 3CC  Final   Culture NO GROWTH 3 DAYS  Final   Report Status PENDING  Incomplete  Urine culture     Status: None   Collection Time: 09/08/16  3:35 AM  Result Value Ref Range Status   Specimen Description URINE, CATHETERIZED  Final   Special Requests NONE  Final   Culture NO GROWTH  Final   Report Status  09/09/2016 FINAL  Final  Culture, respiratory (tracheal aspirate)     Status: Abnormal   Collection Time: 09/08/16 10:12 AM  Result Value Ref Range Status   Specimen Description TRACHEAL ASPIRATE  Final   Special Requests NONE  Final   Gram Stain   Final    FEW WBC PRESENT, PREDOMINANTLY PMN FEW GRAM POSITIVE COCCI IN PAIRS RARE GRAM POSITIVE RODS    Culture MULTIPLE ORGANISMS PRESENT, NONE PREDOMINANT (A)  Final   Report Status 09/10/2016 FINAL  Final         Radiology Studies: Ct Head W & Wo Contrast  Result Date: 09/12/2016 CLINICAL DATA:  66 year old female with recurrent ovarian cancer on chemotherapy. Fall with seizure like activity on 09/07/2016. Transferred from [facility]. EXAM: CT HEAD WITHOUT AND WITH CONTRAST TECHNIQUE: Contiguous axial images were obtained from the base of the skull through the vertex without and with intravenous contrast CONTRAST:  75mL ISOVUE-300 IOPAMIDOL (ISOVUE-300) INJECTION 61% COMPARISON:  [facility] noncontrast head CT 09/07/2016. FINDINGS: Brain: No midline shift, ventriculomegaly, mass effect, evidence of mass lesion, intracranial hemorrhage or evidence of cortically based acute infarction. Gray-white matter differentiation is within normal limits throughout the brain. No abnormal enhancement identified. Vascular: Calcified atherosclerosis at the skull base. Skull: No osseous abnormality identified. Sinuses/Orbits: Visualized paranasal sinuses and mastoids are stable and well pneumatized. Other: Visualized orbits and scalp soft tissues are within normal limits. IMPRESSION: No metastatic disease or acute intracranial abnormality. Normal for age CT appearance of the brain. Electronically Signed   By: Odessa Fleming M.D.   On: 09/12/2016 10:19        Scheduled Meds: . sodium chloride   Intravenous Once  . Chlorhexidine Gluconate Cloth  6 each Topical Daily  . darbepoetin (ARANESP) injection - NON-DIALYSIS  200 mcg  Subcutaneous Q Fri-1800  .  feeding supplement (ENSURE ENLIVE)  1 Bottle Oral BID BM  . insulin aspart  0-15 Units Subcutaneous Q4H  . insulin glargine  10 Units Subcutaneous Q24H  . levETIRAcetam  500 mg Intravenous Q12H  . pantoprazole  40 mg Oral QHS   Continuous Infusions: . fentaNYL infusion INTRAVENOUS Stopped (09/08/16 0730)     LOS: 4 days    Time spent: 35 mins     Kathleen Jeppsen Joline Maxcy, MD [facility] Pager [phone]   If 7PM-7AM, please contact night-coverage [email or web address] Password [email or web address] 09/12/2016, 1:53 PM

## 2016-09-13 LAB — CULTURE, BLOOD (ROUTINE X 2)
CULTURE: NO GROWTH
Culture: NO GROWTH

## 2016-09-14 ENCOUNTER — Ambulatory Visit (HOSPITAL_COMMUNITY): Payer: Medicare HMO | Admitting: Oncology

## 2016-09-14 ENCOUNTER — Ambulatory Visit (HOSPITAL_COMMUNITY): Payer: Medicare HMO

## 2016-09-14 NOTE — Progress Notes (Unsigned)
Kathleen Labrum, MD [place] [id number]  No diagnosis found.  CURRENT THERAPY: Carboplatin/Gemcitabine in a day 1, 8 every 21 day fashion beginning on 4/43/1540, complicated by cytopenias.  INTERVAL HISTORY: Kathleen Whitehead 66 y.o. female returns for followup of Ovarian Cancer/Endometrial cancer, S/P TAH-BSO by Dr. [name] with pre-operative CA-125 elevation and inconclusive pathology regarding one primary with metastatic disease versus two separate primaries followed by Carboplatin/Taxol x 5/6 cycles (last cycle cancelled due to intolerance and thrombocytopenia), followed by EBRT and intravaginal brachytherapy. She did have a recurrence in October 2015 on PET imaging requiring another treatment with EBRT. PET imaging on 06/11/2016 demonstrated progression/recurrence of disease resulting in palliative systemic chemotherapy consisting of Carboplatin/Gemcitabine beginning on 08/03/2016.    Malignant neoplasm of ovary ([place])   04/30/2013 Initial Diagnosis    Ovarian cancer/uterine cancer status post TAH-BSO by Dr. [name] and associates, pathology could not be certain that these were not 2 separate primaries rather than one metastatic process from the uterus to the ovary.      08/15/2013 - 12/06/2013 Chemotherapy    Carboplatin/Taxol with dose reduction of 50% and Taxol due to grade 2+ peripheral neuropathy and gabapentin induced nightmare/hallucinations. 5 out of 6 cycles given with the final cycle being cancelled secondary to significant thrombocytopenia/intolerance.      09/10/2013 - 10/17/2013 Radiation Therapy    5040 cGy delivered by EBRT followed by intravaginal brachytherapy.      04/30/2014 PET scan    PET scan consistent with recurrent disease of either ovarian cancer or endometrial cancer.      06/03/2014 - 07/15/2014 Radiation Therapy    5040 cGy over 28 fractions delivered to the low periaortic area lymph node either metastatic endometrial or metastatic  or ovarian cancer.      07/19/2014 Imaging    Imaging concerning for new mesenteric disease and subdiaphragmatic disease.      08/05/2014 PET scan    No evidence of progression of disease.      08/05/2014 Remission    Negative PET scan.      06/11/2016 PET scan    1. Hypermetabolic aortocaval and right common iliac adenopathy, maximum SUV 17.1, compatible with malignancy. 2. Calcified left mesenteric mass has only low-grade activity, maximum SUV 5.3, merit surveillance. 3. There several small calcifications along the omentum which are not currently metabolically active. 4. Along the scarring in operative site from prior laparotomy there some faintly accentuated metabolic activity which is probably related to the original wound and unlikely to be from tumor. 5. Coronary, aortic arch, and branch vessel atherosclerotic vascular disease.      06/11/2016 Relapse/Recurrence    PET scan with aortocaval & right iliac adenopathy consistent with malignancy.       08/03/2016 -  Chemotherapy    Carboplatin Day 1/Gemzar Days 1&8 every 21 days       ***  ROS  Past Medical History:  Diagnosis Date  . Anemia   . Chemotherapy induced neutropenia ([place])   . Chemotherapy induced thrombocytopenia   . Diabetes mellitus without complication ([place])   . Endometrial cancer ([place])   . Hot flashes   . Hypertension   . Malignant neoplasm ([place])   . Malignant neoplasm of ovary ([place]) 04/30/2013  . Obesity   . Ovarian cancer ([place])   . Pancytopenia ([place])   . Peripheral artery disease ([place])   . Peripheral neuropathy ([place])   . Port-a-cath in place  No past surgical history on file.  Family History  Problem Relation Age of Onset  . Heart failure Mother   . Stroke Sister   . Cancer Sister   . Diabetes Sister   . Leukemia Sister   . Diabetes Brother     Social History   Social History  . Marital status: Married    Spouse name: N/A  . Number of children: N/A  . Years of education:  N/A   Social History Main Topics  . Smoking status: Never Smoker  . Smokeless tobacco: Never Used  . Alcohol use No  . Drug use: No  . Sexual activity: Not on file   Other Topics Concern  . Not on file   Social History Narrative  . No narrative on file     PHYSICAL EXAMINATION  ECOG PERFORMANCE STATUS: {CHL ONC ECOG [phone]}  There were no vitals filed for this visit.  GENERAL:{CHL ONC PE GENERAL:[phone]} SKIN: {CHL ONC PE WHQP:[id number]} HEAD: {CHL ONC PE LDJT:[id number]} EYES: {CHL ONC PE ESPQ:3300762263} EARS: {CHL ONC PE FHLK:5625638937} OROPHARYNX:{CHL ONC PE OROPHARYNX:[id number]}  NECK: {CHL ONC PE DSKA:[id number]} LYMPH:  {CHL ONC PE MBTDH:7416384536} BREAST:{CHL ONC PE BREAST:6780259048} LUNGS: {CHL ONC PE IWOEH:2122482500} HEART: {CHL ONC PE BBCWU:8891694503} ABDOMEN:{CHL ONC PE [phone]} BACK: {CHL ONC PE UUEK:[id number]} EXTREMITIES:{CHL ONC PE [phone]}  NEURO: {CHL ONC PE NEURO:[phone]} PELVIC:{CHL ONC PE PELVIC:[phone]} RECTAL: {CHL ONC PE [phone]}   LABORATORY DATA: CBC    Component Value Date/Time   WBC 2.6 (L) 09/12/2016 0450   RBC 3.08 (L) 09/12/2016 0450   HGB 9.1 (L) 09/12/2016 0450   HCT 27.0 (L) 09/12/2016 0450   PLT 22 (LL) 09/12/2016 0450   MCV 87.7 09/12/2016 0450   MCH 29.5 09/12/2016 0450   MCHC 33.7 09/12/2016 0450   RDW 14.4 09/12/2016 0450   LYMPHSABS 1.0 09/11/2016 0610   MONOABS 0.4 09/11/2016 0610   EOSABS 0.0 09/11/2016 0610   BASOSABS 0.0 09/11/2016 0610      Chemistry      Component Value Date/Time   NA 140 09/11/2016 0610   K 3.9 09/11/2016 0610   CL 110 09/11/2016 0610   CO2 21 (L) 09/11/2016 0610   BUN 9 09/11/2016 0610   CREATININE 0.82 09/11/2016 0610      Component Value Date/Time   CALCIUM 7.7 (L) 09/11/2016 0610   ALKPHOS 76 09/08/2016 0239   AST 70 (H) 09/08/2016 0239   ALT 54 09/08/2016 0239   BILITOT 0.7 09/08/2016 0239      Results for  Kathleen Whitehead, Kathleen Whitehead (MRN [id number]) as of 09/14/2016 08:03  Ref. Range 08/31/2016 08:46  CA 125 Latest Ref Range: 0.0 - 38.1 U/mL 76.7 (H)    PENDING LABS:   RADIOGRAPHIC STUDIES:  Ct Head W & Wo Contrast  Result Date: 09/12/2016 CLINICAL DATA:  66 year old female with recurrent ovarian cancer on chemotherapy. Fall with seizure like activity on 09/07/2016. Transferred from [place]: CT HEAD WITHOUT AND WITH CONTRAST TECHNIQUE: Contiguous axial images were obtained from the base of the skull through the vertex without and with intravenous contrast CONTRAST:  16mL ISOVUE-300 IOPAMIDOL (ISOVUE-300) INJECTION 61% COMPARISON:  [facility] noncontrast head CT 09/07/2016. FINDINGS: Brain: No midline shift, ventriculomegaly, mass effect, evidence of mass lesion, intracranial hemorrhage or evidence of cortically based acute infarction. Gray-white matter differentiation is within normal limits throughout the brain. No abnormal enhancement identified. Vascular: Calcified atherosclerosis at the skull base. Skull: No osseous abnormality identified. Sinuses/Orbits: Visualized paranasal sinuses and mastoids are  stable and well pneumatized. Other: Visualized orbits and scalp soft tissues are within normal limits. IMPRESSION: No metastatic disease or acute intracranial abnormality. Normal for age CT appearance of the brain. Electronically Signed   By: Genevie Ann M.D.   On: 09/12/2016 10:19   Dg Chest Port 1 View  Result Date: 09/10/2016 CLINICAL DATA:  Hypertension, seizure, and ovarian cancer. EXAM: PORTABLE CHEST 1 VIEW COMPARISON:  09/09/2016 FINDINGS: Right-sided PowerPort tip overlies the level of the right atrium. The heart is enlarged. Lungs are clear. Endotracheal tube and nasogastric tube have been removed. IMPRESSION: No evidence for acute cardiopulmonary abnormality. Electronically Signed   By: Nolon Nations M.D.   On: 09/10/2016 09:09   Dg Chest Port 1 View  Result  Date: 09/09/2016 CLINICAL DATA:  Respiratory failure EXAM: PORTABLE CHEST 1 VIEW COMPARISON:  09/08/2016 FINDINGS: Endotracheal tube and nasogastric catheter are again seen and stable. Right-sided chest wall port is again noted and stable. Cardiac shadow is within normal limits. The lungs are well aerated without focal infiltrate or sizable effusion. IMPRESSION: No acute abnormality noted. Electronically Signed   By: Inez Catalina M.D.   On: 09/09/2016 08:21   Dg Chest Port 1 View  Result Date: 09/08/2016 CLINICAL DATA:  Encounter for orogastric tube placement and endotracheal tube position. EXAM: PORTABLE CHEST 1 VIEW COMPARISON:  None. FINDINGS: The patient is slightly rotated on current exam. Endotracheal tube tip terminates 4.2 cm above the carina in satisfactory position. Gastric tube is seen below the left hemidiaphragm in the expected location of the stomach. There is mild elevation of the right hemidiaphragm. No pulmonary consolidation, effusion or pneumothorax. Port catheter is noted with tip at the cavoatrial junction. No acute nor suspicious osseous lesions. IMPRESSION: Satisfactory support line and tube positions.  Clear lungs. Electronically Signed   By: Ashley Royalty M.D.   On: 09/08/2016 02:20   Dg Abd Portable 1v  Result Date: 09/08/2016 CLINICAL DATA:  Gastric tube placement EXAM: PORTABLE ABDOMEN - 1 VIEW COMPARISON:  None. FINDINGS: The tip of a gastric tube projects over the region of the duodenum bulb. The side-port is seen in the pre-pyloric region of the stomach. The bowel gas pattern is unremarkable. Mild elevation the right hemidiaphragm. IMPRESSION: The tip of a gastric tube projects over the region of the duodenal bulb with side-port in the pre-pyloric region of the stomach. Electronically Signed   By: Ashley Royalty M.D.   On: 09/08/2016 02:23     PATHOLOGY:    ASSESSMENT AND PLAN:  No problem-specific Assessment & Plan notes found for this encounter.   ORDERS PLACED FOR THIS  ENCOUNTER: No orders of the defined types were placed in this encounter.   MEDICATIONS PRESCRIBED THIS ENCOUNTER: No orders of the defined types were placed in this encounter.   THERAPY PLAN:  ***  All questions were answered. The patient knows to call the clinic with any problems, questions or concerns. We can certainly see the patient much sooner if necessary.  Patient and plan discussed with Dr. Twana First and she is in agreement with the aforementioned.   This note is electronically signed by: Doy Mince 09/14/2016 7:54 AM

## 2016-09-14 NOTE — Assessment & Plan Note (Deleted)
Ovarian Cancer/Endometrial cancer, S/P TAH-BSO by Dr. [name] with pre-operative CA-125 elevation and inconclusive pathology regarding one primary with metastatic disease versus two separate primaries followed by Carboplatin/Taxol x 5/6 cycles (last cycle cancelled due to intolerance and thrombocytopenia), followed by EBRT and intravaginal brachytherapy. She did have a recurrence in October 2015 on PET imaging requiring another treatment with EBRT. PET imaging on 06/11/2016 demonstrated progression/recurrence of disease resulting in palliative systemic chemotherapy consisting of Carboplatin/Gemcitabine beginning on 08/03/2016.  Treatment course has been complicated by cytopenias requiring delay in therapy and hospitalization in March 2018 with seizures.  Oncology history updated.  Pre-treatment labs today: CBC diff, CMET, Magnesium.  I personally reviewed and went over laboratory results with the patient.  The results are noted within this dictation.  Chart is reviewed and hospitalization is noted with seizure.  EEG performed with nonspecific findings.  Neurology is following patient as an outpatient.  Hospitalization course requiring intubation on 09/08/2016 to protect airway with extubation on 09/09/2016.    I personally reviewed and went over radiographic studies with the patient.  The results are noted within this dictation.  CT imaging of head was negative for intracranial metastases.

## 2016-09-20 NOTE — Assessment & Plan Note (Addendum)
Ovarian Cancer/Endometrial cancer, S/P TAH-BSO by Dr. [name] with pre-operative CA-125 elevation and inconclusive pathology regarding one primary with metastatic disease versus two separate primaries followed by Carboplatin/Taxol x 5/6 cycles (last cycle cancelled due to intolerance and thrombocytopenia), followed by EBRT and intravaginal brachytherapy. She did have a recurrence in October 2015 on PET imaging requiring another treatment with EBRT. PET imaging on 06/11/2016 demonstrated progression/recurrence of disease resulting in palliative systemic chemotherapy consisting of Carboplatin/Gemcitabine beginning on 08/03/2016.  Treatment course has been complicated by cytopenias secondary to heavily pre-treated disease requiring delay in therapy and hospitalization in March 2018 with seizures (secondary to hypomagnesemia).  Oncology history updated.  Pre-treatment labs today: CBC diff, CMET, Magnesium.  I personally reviewed and went over laboratory results with the patient.  The results are noted within this dictation.  Labs DO NOT meet treatment parameters.  Platelet count is 14,000.  Given her distance from the clinic, [place], [place], I will get her set-up for 1 pheresed unit of platelets.  Will pre-medicate with Solu-medrol and Tylenol.  She has an allergy to Benadryl.  I have ordered additional labs due to her pancytopenia: anemia panel.  Hypomagnesemia is noted.  Order placed for IV Magnesium sulfate 4 grams.  She will be re-called for this infusion.  Chart is reviewed and hospitalization is noted with seizure.  EEG performed with nonspecific findings.  Neurology is following patient as an outpatient.  Hospitalization course requiring intubation on 09/08/2016 to protect airway with extubation on 09/09/2016.    I have refilled her Keppra.  She is to have neurology follow-up in the near future.  Given her prolonged cytopenias, I have altered the treatment plan significantly.  Her Gemcitabine has been  reduced by 20%.  I have also discontinued her Carboplatin.  She will therefore be on single-agent Gemcitabine in a day 1, 8 fashion every 21 days.  In the future, we can increase her gemcitabine dose (if tolerated) and add Carboplatin back at a lower AUC (ie 3).  Given her prolonged pancytopenia, I am concerned for MDS/myelophthsis.  If persistent at return appt, I would consider a bone marrow aspiration and biopsy.  We discussed goals of care and started those conversations.  I broached CODE STATUS, but not change in code status established today.  Will continue this conversation moving forward in the future.   I personally reviewed and went over radiographic studies with the patient.  The results are noted within this dictation.  CT imaging of head was negative for intracranial metastases.  Labs in 2 weeks: CBC diff, CMET.  Will plan on chemotherapy in 2 weeks if labs satisfy treatment parameters.  Will refer patient to [facility] for evaluation as this may help with treatment options moving forward (ie BRCA status).  Return in 2 weeks for follow-up.

## 2016-09-20 NOTE — Progress Notes (Addendum)
Kathleen Labrum, MD [address] [place]  [address]  Malignant neoplasm of ovary, unspecified laterality ([place]) - Plan: Practitioner attestation of consent, Complete patient signature process for consent form, Care order/instruction, heparin lock flush 100 unit/mL, sodium chloride flush (NS) 0.9 % injection 3 mL, Type and screen, Vitamin B12, Folate, Iron and TIBC, Ferritin, Magnesium, Magnesium, DISCONTINUED: 0.9 %  sodium chloride infusion, DISCONTINUED: sodium chloride flush (NS) 0.9 % injection 10 mL, DISCONTINUED: heparin lock flush 100 unit/mL, CANCELED: Prepare Pheresed Platelets, CANCELED: Transfuse platelet pheresis  Status epilepticus ([place]) - Plan: levETIRAcetam (KEPPRA) 500 MG tablet  Goals of care, counseling/discussion  Hypomagnesemia - Plan: magnesium sulfate IVPB 4 g 100 mL  CURRENT THERAPY: Carboplatin/Gemcitabine in a day 1, 8 every 21 day fashion beginning on 03/18/7828, complicated by cytopenias.  INTERVAL HISTORY: Kathleen Whitehead 66 y.o. female returns for followup of Ovarian Cancer/Endometrial cancer, S/P TAH-BSO by Dr. [name] with pre-operative CA-125 elevation and inconclusive pathology regarding one primary with metastatic disease versus two separate primaries followed by Carboplatin/Taxol x 5/6 cycles (last cycle cancelled due to intolerance and thrombocytopenia), followed by EBRT and intravaginal brachytherapy. She did have a recurrence in October 2015 on PET imaging requiring another treatment with EBRT. PET imaging on 06/11/2016 demonstrated progression/recurrence of disease resulting in palliative systemic chemotherapy consisting of Carboplatin/Gemcitabine beginning on 08/03/2016.  Complicated by cytopenias requiring dose-reduction in Gemcitabine and the discontinuation of Carboplatin for cycle #3.    Malignant neoplasm of ovary ([place])   04/30/2013 Initial Diagnosis    Ovarian cancer/uterine cancer status post TAH-BSO by Dr. [name] and associates,  pathology could not be certain that these were not 2 separate primaries rather than one metastatic process from the uterus to the ovary.      08/15/2013 - 12/06/2013 Chemotherapy    Carboplatin/Taxol with dose reduction of 50% and Taxol due to grade 2+ peripheral neuropathy and gabapentin induced nightmare/hallucinations. 5 out of 6 cycles given with the final cycle being cancelled secondary to significant thrombocytopenia/intolerance.      09/10/2013 - 10/17/2013 Radiation Therapy    5040 cGy delivered by EBRT followed by intravaginal brachytherapy.      04/30/2014 PET scan    PET scan consistent with recurrent disease of either ovarian cancer or endometrial cancer.      06/03/2014 - 07/15/2014 Radiation Therapy    5040 cGy over 28 fractions delivered to the low periaortic area lymph node either metastatic endometrial or metastatic or ovarian cancer.      07/19/2014 Imaging    Imaging concerning for new mesenteric disease and subdiaphragmatic disease.      08/05/2014 PET scan    No evidence of progression of disease.      08/05/2014 Remission    Negative PET scan.      06/11/2016 PET scan    1. Hypermetabolic aortocaval and right common iliac adenopathy, maximum SUV 17.1, compatible with malignancy. 2. Calcified left mesenteric mass has only low-grade activity, maximum SUV 5.3, merit surveillance. 3. There several small calcifications along the omentum which are not currently metabolically active. 4. Along the scarring in operative site from prior laparotomy there some faintly accentuated metabolic activity which is probably related to the original wound and unlikely to be from tumor. 5. Coronary, aortic arch, and branch vessel atherosclerotic vascular disease.      06/11/2016 Relapse/Recurrence    PET scan with aortocaval & right iliac adenopathy consistent with malignancy.       08/03/2016 -  Chemotherapy    Carboplatin Day 1/Gemzar Days 1&8 every 21 days       08/10/2016  Treatment Plan Change    Day 8 cycle 1 cancelled due to cytopenia      09/07/2016 Treatment Plan Change    Treatment delayed x 1 week due to thrombocytopenia.  Hypomagnesemia noted and replaced IV.      09/08/2016 - 09/12/2016 Hospital Admission    Admit date: 09/08/2016  Admission diagnosis: Seizure Additional comments: Intubated on 3/7 for airway protection and extubated on 09/09/2016.  Hospital course complicated by pancytopenia secondary to systemic chemotherapy.      09/08/2016 Imaging    EEG- This sedated EEG is abnormal due to diffuse slowing of the waking background.  Clinical Correlation of the above findings indicates diffuse cerebral dysfunction that is non-specific in etiology and can be seen with hypoxic/ischemic injury, toxic/metabolic encephalopathies, neurodegenerative disorders, or medication effect.  The absence of epileptiform discharges does not rule out a clinical diagnosis of epilepsy.  Clinical correlation is advised.      09/12/2016 Imaging    CT head- No metastatic disease or acute intracranial abnormality. Normal for age CT appearance of the brain.      09/21/2016 Treatment Plan Change    Carboplatin deleted from treatment plan.  Will pursue single-agent Gemcitabine at reduced dose.      Hospital chart is reviewed.  Oncology history is updated accordingly.  She has recovered from her seizure activity and intubation/extubation.  She is here today to restart chemotherapy.  She denies any specific complaints.  She reports that she is ready to restart chemotherapy today.  Her labs unfortunately, do not satisfy treatment parameters today.  She denies any abnormal bruising or bleeding.  We did start discussions regarding goals of care and CODE STATUS.  No significant progress has been made in either 1 of these arenas but we will continue with these discussions moving forward.    Review of Systems  Constitutional: Positive for malaise/fatigue. Negative for chills,  fever and weight loss.  HENT: Negative.   Eyes: Negative.   Respiratory: Negative.  Negative for cough.   Cardiovascular: Negative.  Negative for chest pain.  Gastrointestinal: Negative.  Negative for blood in stool, constipation, diarrhea, melena, nausea and vomiting.  Genitourinary: Negative.   Musculoskeletal: Negative.   Skin: Negative.   Neurological: Negative.  Negative for weakness.  Endo/Heme/Allergies: Negative.   Psychiatric/Behavioral: Negative.     Past Medical History:  Diagnosis Date  . Anemia   . Chemotherapy induced neutropenia ([place])   . Chemotherapy induced thrombocytopenia   . Diabetes mellitus without complication ([place])   . Endometrial cancer ([place])   . Hot flashes   . Hypertension   . Malignant neoplasm ([place])   . Malignant neoplasm of ovary ([place]) 04/30/2013  . Obesity   . Ovarian cancer ([place])   . Pancytopenia ([place])   . Peripheral artery disease ([place])   . Peripheral neuropathy ([place])   . Port-a-cath in place     History reviewed. No pertinent surgical history.  Family History  Problem Relation Age of Onset  . Heart failure Mother   . Stroke Sister   . Cancer Sister   . Diabetes Sister   . Leukemia Sister   . Diabetes Brother     Social History   Social History  . Marital status: Married    Spouse name: N/A  . Number of children: N/A  . Years of education: N/A   Social History Main Topics  .  Smoking status: Never Smoker  . Smokeless tobacco: Never Used  . Alcohol use No  . Drug use: No  . Sexual activity: Not Asked   Other Topics Concern  . None   Social History Narrative  . None     PHYSICAL EXAMINATION  ECOG PERFORMANCE STATUS: 2 - Symptomatic, <50% confined to bed  Vitals:   09/21/16 0940  BP: (!) 159/63  Pulse: 89  Resp: 16  Temp: 98.9 F (37.2 C)    GENERAL:alert, well nourished, well developed, comfortable, cooperative, obese, pale, smiling and in wheelchair, accompanied by husband. SKIN: skin color, texture,  turgor are normal, no rashes or significant lesions HEAD: Normocephalic, No masses, lesions, tenderness or abnormalities EYES: normal, EOMI, Conjunctiva are pink and non-injected EARS: External ears normal OROPHARYNX:lips, buccal mucosa, and tongue normal and mucous membranes are moist  NECK: supple, trachea midline LYMPH:  no palpable lymphadenopathy BREAST:not examined LUNGS: clear to auscultation  HEART: regular rate & rhythm ABDOMEN:abdomen soft BACK: Back symmetric, no curvature. EXTREMITIES:less then 2 second capillary refill, no joint deformities, effusion, or inflammation  NEURO: alert & oriented x 3 with fluent speech, no focal motor/sensory deficits, in wheelchair.   LABORATORY DATA: CBC    Component Value Date/Time   WBC 2.8 (L) 09/21/2016 1004   RBC 2.79 (L) 09/21/2016 1004   HGB 8.7 (L) 09/21/2016 1004   HCT 25.9 (L) 09/21/2016 1004   PLT 14 (LL) 09/21/2016 1004   MCV 92.8 09/21/2016 1004   MCH 31.2 09/21/2016 1004   MCHC 33.6 09/21/2016 1004   RDW 16.0 (H) 09/21/2016 1004   LYMPHSABS 0.7 09/21/2016 1004   MONOABS 0.2 09/21/2016 1004   EOSABS 0.0 09/21/2016 1004   BASOSABS 0.0 09/21/2016 1004      Chemistry      Component Value Date/Time   NA 137 09/21/2016 1004   K 4.2 09/21/2016 1004   CL 103 09/21/2016 1004   CO2 26 09/21/2016 1004   BUN 12 09/21/2016 1004   CREATININE 0.94 09/21/2016 1004      Component Value Date/Time   CALCIUM 8.8 (L) 09/21/2016 1004   ALKPHOS 81 09/21/2016 1004   AST 26 09/21/2016 1004   ALT 16 09/21/2016 1004   BILITOT 1.0 09/21/2016 1004      Results for TELISSA, PALMISANO (MRN [id number]) as of 09/21/2016 11:58  Ref. Range 08/03/2016 11:16 08/31/2016 08:46  CA 125 Latest Ref Range: 0.0 - 38.1 U/mL 73.7 (H) 76.7 (H)    PENDING LABS:   RADIOGRAPHIC STUDIES:  Ct Head W & Wo Contrast  Result Date: 09/12/2016 CLINICAL DATA:  66 year old female with recurrent ovarian cancer on chemotherapy. Fall with seizure like activity on  09/07/2016. Transferred from [place]: CT HEAD WITHOUT AND WITH CONTRAST TECHNIQUE: Contiguous axial images were obtained from the base of the skull through the vertex without and with intravenous contrast CONTRAST:  43m ISOVUE-300 IOPAMIDOL (ISOVUE-300) INJECTION 61% [facility]. FINDINGS: Brain: No midline shift, ventriculomegaly, mass effect, evidence of mass lesion, intracranial hemorrhage or evidence of cortically based acute infarction. Gray-white matter differentiation is within normal limits throughout the brain. No abnormal enhancement identified. Vascular: Calcified atherosclerosis at the skull base. Skull: No osseous abnormality identified. Sinuses/Orbits: Visualized paranasal sinuses and mastoids are stable and well pneumatized. Other: Visualized orbits and scalp soft tissues are within normal limits. IMPRESSION: No metastatic disease or acute intracranial abnormality. Normal for age CT appearance of the brain. Electronically Signed   By: HLemmie Evens  [place] Crane M.D.   On: 09/12/2016 10:19   Dg Chest Port 1 View  Result Date: 09/10/2016 CLINICAL DATA:  Hypertension, seizure, and ovarian cancer. EXAM: PORTABLE CHEST 1 VIEW COMPARISON:  09/09/2016 FINDINGS: Right-sided PowerPort tip overlies the level of the right atrium. The heart is enlarged. Lungs are clear. Endotracheal tube and nasogastric tube have been removed. IMPRESSION: No evidence for acute cardiopulmonary abnormality. Electronically Signed   By: Nolon Nations M.D.   On: 09/10/2016 09:09   Dg Chest Port 1 View  Result Date: 09/09/2016 CLINICAL DATA:  Respiratory failure EXAM: PORTABLE CHEST 1 VIEW COMPARISON:  09/08/2016 FINDINGS: Endotracheal tube and nasogastric catheter are again seen and stable. Right-sided chest wall port is again noted and stable. Cardiac shadow is within normal limits. The lungs are well aerated without focal infiltrate or sizable effusion.  IMPRESSION: No acute abnormality noted. Electronically Signed   By: Inez Catalina M.D.   On: 09/09/2016 08:21   Dg Chest Port 1 View  Result Date: 09/08/2016 CLINICAL DATA:  Encounter for orogastric tube placement and endotracheal tube position. EXAM: PORTABLE CHEST 1 VIEW COMPARISON:  None. FINDINGS: The patient is slightly rotated on current exam. Endotracheal tube tip terminates 4.2 cm above the carina in satisfactory position. Gastric tube is seen below the left hemidiaphragm in the expected location of the stomach. There is mild elevation of the right hemidiaphragm. No pulmonary consolidation, effusion or pneumothorax. Port catheter is noted with tip at the cavoatrial junction. No acute nor suspicious osseous lesions. IMPRESSION: Satisfactory support line and tube positions.  Clear lungs. Electronically Signed   By: Ashley Royalty M.D.   On: 09/08/2016 02:20   Dg Abd Portable 1v  Result Date: 09/08/2016 CLINICAL DATA:  Gastric tube placement EXAM: PORTABLE ABDOMEN - 1 VIEW COMPARISON:  None. FINDINGS: The tip of a gastric tube projects over the region of the duodenum bulb. The side-port is seen in the pre-pyloric region of the stomach. The bowel gas pattern is unremarkable. Mild elevation the right hemidiaphragm. IMPRESSION: The tip of a gastric tube projects over the region of the duodenal bulb with side-port in the pre-pyloric region of the stomach. Electronically Signed   By: Ashley Royalty M.D.   On: 09/08/2016 02:23     PATHOLOGY:    ASSESSMENT AND PLAN:  Malignant neoplasm of ovary ([facility]) Ovarian Cancer/Endometrial cancer, S/P TAH-BSO by Dr. [name] with pre-operative CA-125 elevation and inconclusive pathology regarding one primary with metastatic disease versus two separate primaries followed by Carboplatin/Taxol x 5/6 cycles (last cycle cancelled due to intolerance and thrombocytopenia), followed by EBRT and intravaginal brachytherapy. She did have a recurrence in October 2015 on PET  imaging requiring another treatment with EBRT. PET imaging on 06/11/2016 demonstrated progression/recurrence of disease resulting in palliative systemic chemotherapy consisting of Carboplatin/Gemcitabine beginning on 08/03/2016.  Treatment course has been complicated by cytopenias secondary to heavily pre-treated disease requiring delay in therapy and hospitalization in March 2018 with seizures (secondary to hypomagnesemia).  Oncology history updated.  Pre-treatment labs today: CBC diff, CMET, Magnesium.  I personally reviewed and went over laboratory results with the patient.  The results are noted within this dictation.  Labs DO NOT meet treatment parameters.  Platelet count is 14,000.  Given her distance from the clinic, [place], [place], I will get her set-up for 1 pheresed unit of platelets.  Will pre-medicate with Solu-medrol and Tylenol.  She has an allergy to Benadryl.  I have ordered additional labs due to her pancytopenia: anemia panel.  Hypomagnesemia is noted.  Order placed for IV Magnesium sulfate 4 grams.  She will be re-called for this infusion.  Chart is reviewed and hospitalization is noted with seizure.  EEG performed with nonspecific findings.  Neurology is following patient as an outpatient.  Hospitalization course requiring intubation on 09/08/2016 to protect airway with extubation on 09/09/2016.    I have refilled her Keppra.  She is to have neurology follow-up in the near future.  Given her prolonged cytopenias, I have altered the treatment plan significantly.  Her Gemcitabine has been reduced by 20%.  I have also discontinued her Carboplatin.  She will therefore be on single-agent Gemcitabine in a day 1, 8 fashion every 21 days.  In the future, we can increase her gemcitabine dose (if tolerated) and add Carboplatin back at a lower AUC (ie 3).  Given her prolonged pancytopenia, I am concerned for MDS/myelophthsis.  If persistent at return appt, I would consider a bone marrow aspiration  and biopsy.  We discussed goals of care and started those conversations.  I broached CODE STATUS, but not change in code status established today.  Will continue this conversation moving forward in the future.   I personally reviewed and went over radiographic studies with the patient.  The results are noted within this dictation.  CT imaging of head was negative for intracranial metastases.  Labs in 2 weeks: CBC diff, CMET.  Will plan on chemotherapy in 2 weeks if labs satisfy treatment parameters.  Will refer patient to [facility] for evaluation as this may help with treatment options moving forward (ie BRCA status).  Return in 2 weeks for follow-up.     ORDERS PLACED FOR THIS ENCOUNTER: Orders Placed This Encounter  Procedures  . Vitamin B12  . Folate  . Iron and TIBC  . Ferritin  . Magnesium  . Practitioner attestation of consent  . Complete patient signature process for consent form  . Care order/instruction  . Type and screen    MEDICATIONS PRESCRIBED THIS ENCOUNTER: Meds ordered this encounter  Medications  . levETIRAcetam (KEPPRA) 500 MG tablet    Sig: Take 1 tablet (500 mg total) by mouth 2 (two) times daily.    Dispense:  60 tablet    Refill:  1    Order Specific Question:   Supervising Provider    Answer:   Brunetta Genera [[id number]]    THERAPY PLAN:  Carboplatin deleted from treatment plan.  Gemcitabine single-agent at 20% dose-reduction will continue.  If tolerated, will increase dose of gemcitabine and possibly the addition of Carboplatin at AUC of 3 in the future.  All questions were answered. The patient knows to call the clinic with any problems, questions or concerns. We can certainly see the patient much sooner if necessary.  Patient and plan discussed with Dr. Twana First and she is in agreement with the aforementioned.   This note is electronically signed by: Doy Mince 09/21/2016 3:09 PM

## 2016-09-21 ENCOUNTER — Encounter (HOSPITAL_COMMUNITY): Payer: Self-pay | Admitting: Oncology

## 2016-09-21 ENCOUNTER — Encounter (HOSPITAL_BASED_OUTPATIENT_CLINIC_OR_DEPARTMENT_OTHER): Payer: Medicare HMO

## 2016-09-21 ENCOUNTER — Encounter (HOSPITAL_BASED_OUTPATIENT_CLINIC_OR_DEPARTMENT_OTHER): Payer: Medicare HMO | Admitting: Oncology

## 2016-09-21 VITALS — BP 159/63 | HR 89 | Temp 98.9°F | Resp 16 | Wt 261.0 lb

## 2016-09-21 DIAGNOSIS — D759 Disease of blood and blood-forming organs, unspecified: Secondary | ICD-10-CM | POA: Diagnosis not present

## 2016-09-21 DIAGNOSIS — D61818 Other pancytopenia: Secondary | ICD-10-CM | POA: Diagnosis not present

## 2016-09-21 DIAGNOSIS — C569 Malignant neoplasm of unspecified ovary: Secondary | ICD-10-CM

## 2016-09-21 DIAGNOSIS — D649 Anemia, unspecified: Secondary | ICD-10-CM

## 2016-09-21 DIAGNOSIS — G40901 Epilepsy, unspecified, not intractable, with status epilepticus: Secondary | ICD-10-CM

## 2016-09-21 DIAGNOSIS — Z7189 Other specified counseling: Secondary | ICD-10-CM

## 2016-09-21 DIAGNOSIS — C541 Malignant neoplasm of endometrium: Secondary | ICD-10-CM | POA: Diagnosis not present

## 2016-09-21 DIAGNOSIS — D696 Thrombocytopenia, unspecified: Secondary | ICD-10-CM | POA: Diagnosis not present

## 2016-09-21 DIAGNOSIS — Z95828 Presence of other vascular implants and grafts: Secondary | ICD-10-CM | POA: Diagnosis present

## 2016-09-21 LAB — COMPREHENSIVE METABOLIC PANEL
ALK PHOS: 81 U/L (ref 38–126)
ALT: 16 U/L (ref 14–54)
ANION GAP: 8 (ref 5–15)
AST: 26 U/L (ref 15–41)
Albumin: 2.8 g/dL — ABNORMAL LOW (ref 3.5–5.0)
BUN: 12 mg/dL (ref 6–20)
CALCIUM: 8.8 mg/dL — AB (ref 8.9–10.3)
CO2: 26 mmol/L (ref 22–32)
Chloride: 103 mmol/L (ref 101–111)
Creatinine, Ser: 0.94 mg/dL (ref 0.44–1.00)
Glucose, Bld: 285 mg/dL — ABNORMAL HIGH (ref 65–99)
Potassium: 4.2 mmol/L (ref 3.5–5.1)
SODIUM: 137 mmol/L (ref 135–145)
TOTAL PROTEIN: 6.2 g/dL — AB (ref 6.5–8.1)
Total Bilirubin: 1 mg/dL (ref 0.3–1.2)

## 2016-09-21 LAB — IRON AND TIBC
Iron: 99 ug/dL (ref 28–170)
SATURATION RATIOS: 43 % — AB (ref 10.4–31.8)
TIBC: 231 ug/dL — AB (ref 250–450)
UIBC: 132 ug/dL

## 2016-09-21 LAB — CBC WITH DIFFERENTIAL/PLATELET
BASOS PCT: 0 %
Basophils Absolute: 0 10*3/uL (ref 0.0–0.1)
EOS ABS: 0 10*3/uL (ref 0.0–0.7)
EOS PCT: 0 %
HCT: 25.9 % — ABNORMAL LOW (ref 36.0–46.0)
HEMOGLOBIN: 8.7 g/dL — AB (ref 12.0–15.0)
LYMPHS PCT: 26 %
Lymphs Abs: 0.7 10*3/uL (ref 0.7–4.0)
MCH: 31.2 pg (ref 26.0–34.0)
MCHC: 33.6 g/dL (ref 30.0–36.0)
MCV: 92.8 fL (ref 78.0–100.0)
Monocytes Absolute: 0.2 10*3/uL (ref 0.1–1.0)
Monocytes Relative: 7 %
NEUTROS ABS: 1.9 10*3/uL (ref 1.7–7.7)
NEUTROS PCT: 67 %
Platelets: 14 10*3/uL — CL (ref 150–400)
RBC: 2.79 MIL/uL — ABNORMAL LOW (ref 3.87–5.11)
RDW: 16 % — ABNORMAL HIGH (ref 11.5–15.5)
WBC: 2.8 10*3/uL — ABNORMAL LOW (ref 4.0–10.5)

## 2016-09-21 LAB — FERRITIN: FERRITIN: 338 ng/mL — AB (ref 11–307)

## 2016-09-21 LAB — MAGNESIUM: MAGNESIUM: 1 mg/dL — AB (ref 1.7–2.4)

## 2016-09-21 LAB — VITAMIN B12: Vitamin B-12: 381 pg/mL (ref 180–914)

## 2016-09-21 LAB — FOLATE: Folate: 35.2 ng/mL (ref >5.9)

## 2016-09-21 MED ORDER — HEPARIN SOD (PORK) LOCK FLUSH 100 UNIT/ML IV SOLN
500.0000 [IU] | Freq: Every day | INTRAVENOUS | Status: AC | PRN
Start: 2016-09-21 — End: 2016-09-21
  Administered 2016-09-21: 500 [IU]
  Filled 2016-09-21: qty 5

## 2016-09-21 MED ORDER — SODIUM CHLORIDE 0.9 % IV SOLN
250.0000 mL | Freq: Once | INTRAVENOUS | Status: AC
Start: 2016-09-21 — End: 2016-09-21
  Administered 2016-09-21: 250 mL via INTRAVENOUS

## 2016-09-21 MED ORDER — ACETAMINOPHEN 325 MG PO TABS
650.0000 mg | ORAL_TABLET | Freq: Once | ORAL | Status: AC
  Administered 2016-09-21: 650 mg via ORAL
  Filled 2016-09-21: qty 2

## 2016-09-21 MED ORDER — LEVETIRACETAM 500 MG PO TABS: 500.0000 mg | ORAL_TABLET | Freq: Two times a day (BID) | ORAL | 1 refills | Status: DC

## 2016-09-21 MED ORDER — SODIUM CHLORIDE 0.9% FLUSH
10.0000 mL | INTRAVENOUS | Status: AC | PRN
  Administered 2016-09-21: 10 mL

## 2016-09-21 MED ORDER — METHYLPREDNISOLONE SODIUM SUCC 40 MG IJ SOLR
40.0000 mg | Freq: Once | INTRAMUSCULAR | Status: AC
  Administered 2016-09-21: 40 mg via INTRAVENOUS
  Filled 2016-09-21: qty 1

## 2016-09-21 NOTE — Patient Instructions (Signed)
[facility] Discharge Instructions  RECOMMENDATIONS MADE BY THE CONSULTANT AND ANY TEST RESULTS WILL BE SENT TO YOUR REFERRING PHYSICIAN.  Received 1 unit of Platelets today. Follow-up as scheduled. Call clinic for any questions or concerns  Thank you for choosing [facility] at [facility] to provide your oncology and hematology care.  To afford each patient quality time with our provider, please arrive at least 15 minutes before your scheduled appointment time.    If you have a lab appointment with the Cancer Center please come in thru the  Main Entrance and check in at the main information desk  You need to re-schedule your appointment should you arrive 10 or more minutes late.  We strive to give you quality time with our providers, and arriving late affects you and other patients whose appointments are after yours.  Also, if you no show three or more times for appointments you may be dismissed from the clinic at the providers discretion.     Again, thank you for choosing [facility].  Our hope is that these requests will decrease the amount of time that you wait before being seen by our physicians.       _____________________________________________________________  Should you have questions after your visit to [facility], please contact our office at [phone] between the hours of 8:30 a.m. and 4:30 p.m.  Voicemails left after 4:30 p.m. will not be returned until the following business day.  For prescription refill requests, have your pharmacy contact our office.       Resources For Cancer Patients and their Caregivers ? American Cancer Society: Can assist with transportation, wigs, general needs, runs Look Good Feel Better.        1-888-227-6333 ? Cancer Care: Provides financial assistance, online support groups, medication/co-pay assistance.  1-800-813-HOPE (4673) ? [facility] Assists [place] cancer patients and their families through emotional , educational and financial support.  336-427-4357 ? [place] DSS Where to apply for food stamps, Medicaid and utility assistance. 336-342-1394 ? [facility]: Transportation to medical appointments. 336-347-2287 ? Social Security Administration: May apply for disability if have a Stage IV cancer. 336-342-7796 1-800-772-1213 ? [place] Aging, Disability and Transit Services: Assists with nutrition, care and transit needs. 336-349-2343  Cancer Center Support Programs: @10RELATIVEDAYS@ > Cancer Support Group  2nd Tuesday of the month 1pm-2pm, Journey Room  > Creative Journey  3rd Tuesday of the month 1130am-1pm, Journey Room  > Look Good Feel Better  1st Wednesday of the month 10am-12 noon, Journey Room (Call American Cancer Society to register 1-800-395-5775)   

## 2016-09-21 NOTE — Progress Notes (Signed)
Claudette Head tolerated Platelet infusion well without complaints or incident. VSS upon discharge. Pt discharged via wheelchair in satisfactory condition accompanied by her husband

## 2016-09-21 NOTE — Addendum Note (Signed)
Addended by: Baird [facility] on: 09/21/2016 03:09 PM   Modules accepted: Orders

## 2016-09-21 NOTE — Progress Notes (Signed)
CRITICAL VALUE ALERT Critical value received:  Platelets-14 Date of notification:  09/21/16 Time of notification: 1032 Critical value read back:  Yes.   Nurse who received alert:  M.Vere Diantonio, LPN MD notified (1st page):  T.Kefalas, PA-C

## 2016-09-21 NOTE — Patient Instructions (Signed)
[place] at [facility] Discharge Instructions  RECOMMENDATIONS MADE BY THE CONSULTANT AND ANY TEST RESULTS WILL BE SENT TO YOUR REFERRING PHYSICIAN.  You were seen today by Kirby Crigler PA-C. Labs in 1-2 weeks . Follow up in 2 weeks.   Thank you for choosing [place] at [facility] to provide your oncology and hematology care.  To afford each patient quality time with our provider, please arrive at least 15 minutes before your scheduled appointment time.    If you have a lab appointment with the [facility] please come in thru the  Main Entrance and check in at the main information desk  You need to re-schedule your appointment should you arrive 10 or more minutes late.  We strive to give you quality time with our providers, and arriving late affects you and other patients whose appointments are after yours.  Also, if you no show three or more times for appointments you may be dismissed from the clinic at the providers discretion.     Again, thank you for choosing [facility].  Our hope is that these requests will decrease the amount of time that you wait before being seen by our physicians.       _____________________________________________________________  Should you have questions after your visit to [facility], please contact our office at [phone] between the hours of 8:30 a.m. and 4:30 p.m.  Voicemails left after 4:30 p.m. will not be returned until the following business day.  For prescription refill requests, have your pharmacy contact our office.       Resources For Cancer Patients and their Caregivers ? American Cancer Society: Can assist with transportation, wigs, general needs, runs Look Good Feel Better.        321-025-3166 ? Cancer Care: Provides financial assistance, online support groups, medication/co-pay assistance.  1-800-813-HOPE 218-734-1078) ? [place] Assists [place] cancer patients and their families through emotional , educational and financial support.  819-171-5408 ? [place] DSS Where to apply for food stamps, Medicaid and utility assistance. 418-888-9531 ? [employer]: Transportation to medical appointments. 918-804-0126 ? Social Security Administration: May apply for disability if have a Stage IV cancer. 8568641644 636-209-4696 ? [employer], Disability and Transit Services: Assists with nutrition, care and transit needs. [place] Support Programs: @10RELATIVEDAYS @ > Cancer Support Group  2nd Tuesday of the month 1pm-2pm, Journey Room  > Creative Journey  3rd Tuesday of the month 1130am-1pm, Journey Room  > Look Good Feel Better  1st Wednesday of the month 10am-12 noon, Journey Room (Call [place] to register [phone])

## 2016-09-22 ENCOUNTER — Other Ambulatory Visit (HOSPITAL_COMMUNITY): Payer: Self-pay

## 2016-09-22 ENCOUNTER — Other Ambulatory Visit (HOSPITAL_COMMUNITY): Payer: Self-pay | Admitting: Oncology

## 2016-09-22 ENCOUNTER — Telehealth (HOSPITAL_COMMUNITY): Payer: Self-pay

## 2016-09-22 ENCOUNTER — Other Ambulatory Visit (HOSPITAL_COMMUNITY): Payer: Self-pay | Admitting: Emergency Medicine

## 2016-09-22 LAB — PREPARE PLATELET PHERESIS: Unit division: 0

## 2016-09-22 LAB — BPAM PLATELET PHERESIS
Blood Product Expiration Date: 201803212359
ISSUE DATE / TIME: 201803201255
UNIT TYPE AND RH: 8400

## 2016-09-22 NOTE — Telephone Encounter (Signed)
Called patient to try to schedule magnesium appt. She refuses to come for mag infusion. She states the PA-C didn't say anything about her magnesium being low yesterday. I explained that we got the results after she left. She also said that the last time she received magnesium was 09/07/16. She states she told the nursing staff that her hand felt funny but it stopped. Then when she got home that evening her hand felt funny again. Then that night she had a seizure and went to [facility] then to [facility]. She states she was in the ICU 4 days at [facility] and was told that her magnesium level was normal. I explained to her that I was sure it was normal because she had received a magnesium infusion. Then she states that she was told by MD at [facility] that the magnesium infusion caused her seizures. I tried to explain to the patient that low magnesium causes seizures and certain medical conditions can upset your body's balance of magnesium and lead to adverse effects, such as seizures. She still refuses to come for infusion. She has labs, treatment and follow up on 10/05/16 and wants to wait until then and recheck her magnesium. PA-C notified. Orders entered for magnesium for 4/3.

## 2016-09-22 NOTE — Progress Notes (Signed)
Patient's labs yesterday demonstrated a magnesium level of 1.0.  She was there re-called for scheduling of IV magnesium replacement.  Nursing called patient and this was the patient's response:  "Called patient to try to schedule magnesium appt. She refuses to come for mag infusion. She states you didn't say anything about it being low yesterday. I explained that we got the results after she left. She also said that the last time she received magnesium was 09/07/16. She states she told the nursing staff that her hand felt funny but it stopped. Then when she got home that evening her hand felt funny again. Then that night she had a seizure and went to [facility] then to [facility]. She states she was in the ICU 4 days at [facility] and was told that her magnesium level was normal. I told her that I was sure it was normal because she had received a magnesium infusion. Then she states that she was told by MD at [facility] that the magnesium infusion caused her seizure. I tried to explain to the patient that low magnesium causes seizures and certain medical conditions can upset your body's balance of magnesium and lead to adverse effects, such as seizures. She still refuses to come for infusion. She has labs, treatment and follow up on 10/05/16 and wants to wait until then and recheck her magnesium. Orders entered for magnesium for 4/3."  I have informed nursing about the potential complications associated with hypomagnesemia:  1. Tetany  2. Seizures  3. Involuntary movements  4. EKG changes, widening of QRS complex, prolongation of PR interval, peaking of T-waves.  5. Atrial fibrillation  6. Hypokalemia  KEFALAS,THOMAS, PA-C 09/22/2016 1:47 PM

## 2016-09-23 ENCOUNTER — Ambulatory Visit (HOSPITAL_COMMUNITY): Payer: Medicare HMO

## 2016-10-04 ENCOUNTER — Other Ambulatory Visit (HOSPITAL_COMMUNITY): Payer: Self-pay | Admitting: Oncology

## 2016-10-04 DIAGNOSIS — C569 Malignant neoplasm of unspecified ovary: Secondary | ICD-10-CM

## 2016-10-04 MED ORDER — OXYCODONE-ACETAMINOPHEN 10-325 MG PO TABS: 1.0000 | ORAL_TABLET | ORAL | 0 refills | Status: DC | PRN

## 2016-10-04 NOTE — Progress Notes (Signed)
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 [name] and associates, pathology could not be certain that these were not 2 separate primaries rather than one metastatic process from the uterus to the ovary.      08/15/2013 - 12/06/2013 Chemotherapy    Carboplatin/Taxol with dose reduction of 50% and Taxol due to grade 2+ peripheral neuropathy and gabapentin induced nightmare/hallucinations. 5 out of 6 cycles given with the final cycle being cancelled secondary to significant thrombocytopenia/intolerance.      09/10/2013 - 10/17/2013 Radiation Therapy    5040 cGy delivered by EBRT followed by intravaginal brachytherapy.      04/30/2014 PET scan    PET scan consistent with recurrent disease of either ovarian cancer or endometrial cancer.      06/03/2014 - 07/15/2014 Radiation Therapy    5040 cGy over 28 fractions delivered to the low periaortic area lymph node either metastatic endometrial or metastatic or ovarian cancer.      07/19/2014 Imaging    Imaging concerning for new mesenteric disease and subdiaphragmatic disease.      08/05/2014 PET scan    No evidence of progression of disease.      08/05/2014 Remission    Negative PET scan.      06/11/2016 PET scan    1. Hypermetabolic aortocaval and right common iliac adenopathy, maximum SUV 17.1, compatible with malignancy. 2. Calcified left mesenteric mass has only low-grade activity, maximum SUV 5.3, merit surveillance. 3. There several small calcifications along the omentum which are not currently metabolically active. 4.  Along the scarring in operative site from prior laparotomy there some faintly accentuated metabolic activity which is probably related to the original wound and unlikely to be from tumor. 5. Coronary, aortic arch, and branch vessel atherosclerotic vascular disease.      06/11/2016 Relapse/Recurrence    PET scan with aortocaval & right iliac adenopathy consistent with malignancy.       08/03/2016 -  Chemotherapy    Carboplatin Day 1/Gemzar Days 1&8 every 21 days       08/10/2016 Treatment Plan Change    Day 8 cycle 1 cancelled due to cytopenia      09/07/2016 Treatment Plan Change    Treatment delayed x 1 week due to thrombocytopenia.  Hypomagnesemia noted and replaced IV.      09/08/2016 - 09/12/2016 Hospital Admission    Admit date: 09/08/2016  Admission diagnosis: Seizure Additional comments: Intubated on 3/7 for airway protection and extubated on 09/09/2016.  Hospital course complicated by pancytopenia secondary to systemic chemotherapy.      09/08/2016 Imaging    EEG- This sedated EEG is abnormal due to diffuse slowing of the waking background.  Clinical Correlation of the above findings indicates diffuse cerebral dysfunction that is non-specific in etiology and can be seen with hypoxic/ischemic injury, toxic/metabolic encephalopathies, neurodegenerative disorders, or medication effect.  The absence of epileptiform discharges does not rule out a clinical diagnosis of epilepsy.  Clinical correlation is advised.      09/12/2016 Imaging    CT head- No metastatic disease or acute intracranial abnormality. Normal for  age CT appearance of the brain.      09/21/2016 Treatment Plan Change    Carboplatin deleted from treatment plan.  Will pursue single-agent Gemcitabine at reduced dose.         INTERVAL HISTORY:  Mrs. Mumford presents today for consideration for next cycle chemotherapy.   Since her recent hospitalization for seizures and pancytopenia from 09/08/16-09/12/16, she has not  resumed chemotherapy given continued cytopenias.  Carboplatin was discontinued from treatment plan and remaining single-agent Gemcitabine has been dose-reduced.    At her last visit to the cancer center on 09/21/16, her platelets were insufficient for chemotherapy; she received 1 unit platelet transfusion for platelet count of 14,000. Her magnesium was also noted to be very low at 1 mg/dL; she received 4 gm IV magnesium sulfate in clinic as well.    Overall, she feels like her usual self; largely no new complaints. Remains on Keppra as prescribed; no seizure activity noted by patient or her husband.  She continues to struggle with pain to her back/hands/legs/feet. She has not been taking the Fentanyl patches; she has a box of 25 mcg Fentanyl patches that she has not started yet. She continues to take Percocet 10/325 as needed for pain; "my pain is always about the same."   She continues to have periodic nausea; no vomiting. Denies fever or chills; "I just stay cold all the time."       REVIEW OF SYSTEMS:  Review of Systems  Constitutional: Positive for fatigue. Negative for chills and fever.  HENT:  Negative.  Negative for mouth sores and nosebleeds.   Eyes: Negative.   Respiratory: Negative.  Negative for cough and shortness of breath.   Cardiovascular: Negative.   Gastrointestinal: Positive for nausea (alleviated with zofran). Negative for abdominal pain, blood in stool and diarrhea.       Denies melena   Endocrine: Negative.   Genitourinary: Negative.  Negative for dysuria, hematuria and vaginal bleeding.   Musculoskeletal: Positive for arthralgias.       Denies falls (R) hip and buttock pain.   Skin: Negative.   Neurological: Positive for numbness ( neuropathy in hands, legs and feet. ). Negative for dizziness and headaches.  Hematological: Negative.  Does not bruise/bleed easily.  Psychiatric/Behavioral: Negative.   All other systems reviewed and are negative.    PAST  MEDICAL/SURGICAL HISTORY:  Past Medical History:  Diagnosis Date  . Anemia   . Chemotherapy induced neutropenia ([place])   . Chemotherapy induced thrombocytopenia   . Diabetes mellitus without complication ([place])   . Endometrial cancer ([place])   . Hot flashes   . Hypertension   . Malignant neoplasm ([place])   . 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 Obesity   . Ovarian cancer ([place])   . Pancytopenia ([place])   . Peripheral artery disease ([place])   . Peripheral neuropathy ([place])   . Port-a-cath in place    History reviewed. No pertinent surgical history.   SOCIAL HISTORY:  Social History   Social History  . Marital status: Married    Spouse name: N/A  . Number of children: N/A  . Years of education: N/A   Occupational History  . Not on file.   Social History Main Topics  . Smoking status: Never Smoker  . Smokeless tobacco: Never Used  . Alcohol use No  . Drug use: No  . Sexual activity: Not Currently   Other Topics Concern  . Not on file   Social History Narrative  .  No narrative on file    FAMILY HISTORY:  Family History  Problem Relation Age of Onset  . Heart failure Mother   . Stroke Sister   . Cancer Sister   . Diabetes Sister   . Leukemia Sister   . Diabetes Brother     CURRENT MEDICATIONS:  Outpatient Encounter Prescriptions as of 10/05/2016  Medication Sig Note  . aspirin EC 325 MG tablet Take 325 mg by mouth daily.    . cetirizine (ZYRTEC) 10 MG tablet Take 10 mg by mouth daily.    . Cholecalciferol (VITAMIN D PO) Take 1 tablet by mouth daily.   Marland Kitchen glipiZIDE (GLUCOTROL) 10 MG tablet TAKE 1 TABLET BY MOUTH TWICE A DAY   . levETIRAcetam (KEPPRA) 500 MG tablet Take 1 tablet (500 mg total) by mouth 2 (two) times daily.   Marland Kitchen lisinopril (PRINIVIL,ZESTRIL) 40 MG tablet Take 40 mg by mouth daily.    Marland Kitchen loperamide (IMODIUM) 2 MG capsule Take 2 mg by mouth as needed for diarrhea or loose stools.    . metFORMIN (GLUCOPHAGE) 1000 MG tablet Take 1,000 mg by mouth 2  (two) times daily with a meal.    . Omega-3 Fatty Acids (FISH OIL) 1000 MG CAPS Take 1,000 mg by mouth daily.    . ondansetron (ZOFRAN) 8 MG tablet Take 1 tablet (8 mg total) by mouth every 8 (eight) hours as needed for nausea or vomiting. 09/08/2016: Spouse states pt takes both  . ondansetron (ZOFRAN-ODT) 4 MG disintegrating tablet Take 4 mg by mouth every 8 (eight) hours as needed for nausea or vomiting.  09/08/2016: Spouse states pt takes both  . oxyCODONE-acetaminophen (PERCOCET) 10-325 MG tablet Take 1 tablet by mouth every 4 (four) hours as needed for pain.   . Polyethylene Glycol 3350 (MIRALAX PO) Take 17 g by mouth daily as needed for moderate constipation.    . prochlorperazine (COMPAZINE) 10 MG tablet Take 1 tablet (10 mg total) by mouth every 6 (six) hours as needed for nausea or vomiting.   . [DISCONTINUED] fentaNYL (DURAGESIC - DOSED MCG/HR) 12 MCG/HR Place 12 mcg onto the skin every 3 (three) days.   . fentaNYL (DURAGESIC - DOSED MCG/HR) 25 MCG/HR patch Place 25 mcg onto the skin every 3 (three) days.   Marland Kitchen lidocaine-prilocaine (EMLA) cream Apply a quarter size amount to affected area 1 hour prior to coming to chemotherapy. (Patient not taking: Reported on 10/05/2016)    No facility-administered encounter medications on file as of 10/05/2016.      ALLERGIES:  Allergies  Allergen Reactions  . Diphenhydramine Palpitations  . Iodinated Diagnostic Agents Other (See Comments)    Unknown  . Duloxetine Hcl Nausea And Vomiting  . Ibuprofen Other (See Comments)    Makes my bones hurt  . Penicillins Rash       PHYSICAL EXAM:  ECOG Performance status: 2-3 - Symptomatic; requires assistance with ADLs      Physical Exam  Constitutional: She is oriented to person, place, and time and well-developed, well-nourished, and in no distress.  Seen in treatment area recliner   HENT:  Head: Normocephalic and atraumatic.  Mouth/Throat: Oropharynx is clear and moist. No oropharyngeal exudate.    Eyes: Conjunctivae are normal. Pupils are equal, round, and reactive to light. No scleral icterus.  Neck: Normal range of motion. Neck supple.  Cardiovascular: Normal rate, regular rhythm and normal heart sounds.   Pulmonary/Chest: Effort normal and breath sounds normal. No respiratory distress. She has no wheezes. She has  no rales.  Abdominal: Soft. Bowel sounds are normal. There is no tenderness. There is no guarding.  Musculoskeletal: Normal range of motion. She exhibits edema (left hand edema (chronic since 2013 after a fracture per patient) ).  1+ pitting edema in bilateral legs/ankles/feet   Lymphadenopathy:    She has no cervical adenopathy.  Neurological: She is alert and oriented to person, place, and time.  Seen seated in treatment chair; gait not observed.  Skin: Skin is warm and dry. There is pallor.  Psychiatric: Mood, memory, affect and judgment normal.  Nursing note and vitals reviewed.    LABORATORY DATA:  I have reviewed the labs as listed.  CBC    Component Value Date/Time   WBC 3.9 (L) 10/05/2016 1017   RBC 3.03 (L) 10/05/2016 1017   HGB 9.7 (L) 10/05/2016 1017   HCT 29.6 (L) 10/05/2016 1017   PLT 54 (L) 10/05/2016 1017   MCV 97.7 10/05/2016 1017   MCH 32.0 10/05/2016 1017   MCHC 32.8 10/05/2016 1017   RDW 20.7 (H) 10/05/2016 1017   LYMPHSABS 1.0 10/05/2016 1017   MONOABS 0.5 10/05/2016 1017   EOSABS 0.0 10/05/2016 1017   BASOSABS 0.0 10/05/2016 1017   CMP Latest Ref Rng & Units 10/05/2016 09/21/2016 09/11/2016  Glucose 65 - 99 mg/dL 260(H) 285(H) 104(H)  BUN 6 - 20 mg/dL 14 12 9   Creatinine 0.44 - 1.00 mg/dL 1.05(H) 0.94 0.82  Sodium 135 - 145 mmol/L 135 137 140  Potassium 3.5 - 5.1 mmol/L 4.3 4.2 3.9  Chloride 101 - 111 mmol/L 102 103 110  CO2 22 - 32 mmol/L 25 26 21(L)  Calcium 8.9 - 10.3 mg/dL 9.3 8.8(L) 7.7(L)  Total Protein 6.5 - 8.1 g/dL 6.5 6.2(L) -  Total Bilirubin 0.3 - 1.2 mg/dL 0.6 1.0 -  Alkaline Phos 38 - 126 U/L 71 81 -  AST 15 - 41 U/L  28 26 -  ALT 14 - 54 U/L 15 16 -    PENDING LABS:    DIAGNOSTIC IMAGING:  I have personally reviewed the radiologic reports and agree with below results.   PET scan: 06/11/16 CLINICAL DATA:  Subsequent Treatment strategy for uterine and ovarian cancer with increasing tumor markers appear.  EXAM: NUCLEAR MEDICINE PET SKULL BASE TO THIGH  TECHNIQUE: 12.3 mCi F-18 FDG was injected intravenously. Full-ring PET imaging was performed from the skull base to thigh after the radiotracer. CT data was obtained and used for attenuation correction and anatomic localization.  FASTING BLOOD GLUCOSE:  Value: 86 mg/dl  COMPARISON:  Multiple exams, including CT abdomen and pelvis dated 07/19/2014  FINDINGS: NECK  No hypermetabolic lymph nodes in the neck.  CHEST  No hypermetabolic mediastinal or hilar nodes. No suspicious pulmonary nodules on the CT data. Coronary, aortic arch, and branch vessel atherosclerotic vascular disease.  ABDOMEN/PELVIS  No abnormal hypermetabolic activity within the liver, pancreas, adrenal glands, or spleen. Clustered right periaortic lymph nodes below the level of the renal veins, the primarily posterior to the IVC, measuring up to about 1.3 cm in short axis on image 130/4, maximum SUV 17.1. Abnormal hypermetabolic nodes extend into the right common iliac chain, index right iliac node 1.1 cm in short axis on image 143/4. These nodes are increased compared to 07/19/14.  Calcified left mesenteric mass measuring 3.3 by 2.2 cm, metabolic activity is similar to the surrounding small bowel, maximum SUV approximately 5.3  Along the surgical site in the right rectus abdominus there is some very faintly increased activity,  maximum SUV 4.0, probably simply postoperative.  Faint curvilinear calcification in the left upper quadrant near the splenic flexure on image 122/4 is not hypermetabolic and a least 6 mm in diameter.  There some small  calcifications along the omentum.  Chronic presacral edema.  Uterus and ovaries absent.  SKELETON  No focal hypermetabolic activity to suggest skeletal metastasis.  IMPRESSION: 1. Hypermetabolic aortocaval and right common iliac adenopathy, maximum SUV 17.1, compatible with malignancy. 2. Calcified left mesenteric mass has only low-grade activity, maximum SUV 5.3, merit surveillance. 3. There several small calcifications along the omentum which are not currently metabolically active. 4. Along the scarring in operative site from prior laparotomy there some faintly accentuated metabolic activity which is probably related to the original wound and unlikely to be from tumor. 5. Coronary, aortic arch, and branch vessel atherosclerotic vascular disease.   Electronically Signed   By: Van Clines M.D.   On: 06/11/2016 13:05      PATHOLOGY:     ASSESSMENT & PLAN:  Ms. Kathleen Whitehead is a pleasant 66 y.o. 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 She went on to receive adjuvant chemotherapy with Carbo/Taxol x 5 cycles requiring 50% dose reduction d/t peripheral neuropathy; 6th cycle held d/t cytopenias and intolerance; she completed chemotherapy on 12/06/13. She also received intravaginal brachytherapy, which completed on 10/17/13.  Post treatment PET scan in 04/2014 consistent with recurrent disease. She went on to complete external beam radiation to the low periaortic area lymph node 28 fractions, and completing radiation on 07/15/14. She was noted to be in remission on PET imaging on 08/05/14.  Subsequent PET scan on 06/11/16, revealed hypermetabolic aortocaval and right common iliac adenopathy compatible with malignancy.  She began chemotherapy with Carbo/Gemzar on 07/2016. There have been several treatment delays d/t cytopenias.  From 09/08/16-09/12/16, she was hospitalized  for seizure requiring intubation for airway protection. Carboplatin has since been discontinued from her treatment plan; we will attempt continued treatment with single-agent dose-reduced Gemzar. She is here today for consideration for next cycle of chemotherapy.     Endometrial/Ovarian cancer:  -Carboplatin discontinued from treatment plan following recent hospitalization for seizures and significant pancytopenia. Single-agent dose-reduced Gemcitabine remains on treatment plan.  -Labs reviewed and are inadequate for Gemzar today; platelets 54,000; hold Gemzar today. Plan discussed with Dr. Talbert Cage. She has been unable to get chemotherapy since 08/31/16 d/t cytopenias.  -Return to cancer center in 2 weeks for re-consideration of Gemzar. Our hope is this interval will allow adequate bone marrow recovery to goal of platelet count >100,000 before she receives next dose of chemotherapy.  -May consider restaging imaging at her next follow-up visit given the interval since her last dose of chemotherapy.   Pancytopenia secondary to chemotherapy:  -Platelets 54,000 today. Denies any frank bleeding. No need for transfusion.   -WBC only mildly low & improved at 3.9 today.   -Hemoglobin remains low, but improved at 9.7 today. No need for transfusion today.   Hypomagnesemia: -Serum mag 1.2 today. Unclear etiology of serial magnesium loss.  -2 gm IV magnesium sulfate given today in clinic; patient reluctant to get high-dose IV magnesium as she had seizures of unknown etiology after previous magnesium infusion.  -Will add oral prescription Mag Ox 400 mg po BID to help replete magnesium as well; we discussed possible side effects of oral magnesium, with the most common being diarrhea. Encouraged her to let us know if she couldn't  tolerate oral mag. (she notes that she has tolerated OTC mag supplements well in past).   Peripheral neuropathy & arthralgias, likely secondary to chemotherapy:  -Her pain is likely  secondary to chemotherapy. -We previously increased her Fentanyl patch to 50 mcg, but she did not start this dose of patches. Recommended she start the patches so we could monitor her pain and make adjustments, as needed.  -Previously unable to tolerate neuropathic pain medications including gabapentin, Lyrica, & Cymbalta.  -Continue Percocet 10/325 for breakthrough pain; paper prescription provided to patient by Kirby Crigler, PA-C today.   Constipation prevention in the setting of opiate use:  -Reinforced the importance of maintaining an adequate bowel regimen with stool softeners/gentle laxatives like Miralax, as appropriate, given increase in opiate therapy.   Nausea & Diarrhea, likely secondary to chemotherapy:  -Resolved -Continue Zofran and Imodium, as needed.      Dispo:  -Return to cancer center in 2 weeks for chemo consideration and follow-up.  -May consider restaging imaging at her next follow-up visit given the interval since her last dose of chemotherapy.    All questions were answered to patient's stated satisfaction. Encouraged patient to call with any new concerns or questions before her next visit to the cancer center and we can certain see her sooner, if needed.    Plan of care discussed with Dr. Twana First, who agrees with the above aforementioned.    Orders placed this encounter:  No orders of the defined types were placed in this encounter.   Mike Craze, NP [facility] [phone]

## 2016-10-05 ENCOUNTER — Encounter (HOSPITAL_COMMUNITY): Payer: Self-pay | Admitting: Adult Health

## 2016-10-05 ENCOUNTER — Encounter (HOSPITAL_COMMUNITY): Payer: Medicare HMO | Attending: Hematology | Admitting: Adult Health

## 2016-10-05 ENCOUNTER — Other Ambulatory Visit (HOSPITAL_COMMUNITY): Payer: Medicare HMO

## 2016-10-05 ENCOUNTER — Encounter (HOSPITAL_BASED_OUTPATIENT_CLINIC_OR_DEPARTMENT_OTHER): Payer: Medicare HMO

## 2016-10-05 DIAGNOSIS — D696 Thrombocytopenia, unspecified: Secondary | ICD-10-CM

## 2016-10-05 DIAGNOSIS — Z95828 Presence of other vascular implants and grafts: Secondary | ICD-10-CM | POA: Insufficient documentation

## 2016-10-05 DIAGNOSIS — G629 Polyneuropathy, unspecified: Secondary | ICD-10-CM | POA: Diagnosis not present

## 2016-10-05 DIAGNOSIS — C569 Malignant neoplasm of unspecified ovary: Secondary | ICD-10-CM

## 2016-10-05 DIAGNOSIS — C541 Malignant neoplasm of endometrium: Secondary | ICD-10-CM | POA: Diagnosis not present

## 2016-10-05 DIAGNOSIS — M2550 Pain in unspecified joint: Secondary | ICD-10-CM | POA: Diagnosis not present

## 2016-10-05 DIAGNOSIS — D61818 Other pancytopenia: Secondary | ICD-10-CM

## 2016-10-05 LAB — CBC WITH DIFFERENTIAL/PLATELET
BASOS PCT: 0 %
Basophils Absolute: 0 10*3/uL (ref 0.0–0.1)
EOS ABS: 0 10*3/uL (ref 0.0–0.7)
Eosinophils Relative: 1 %
HCT: 29.6 % — ABNORMAL LOW (ref 36.0–46.0)
HEMOGLOBIN: 9.7 g/dL — AB (ref 12.0–15.0)
LYMPHS ABS: 1 10*3/uL (ref 0.7–4.0)
Lymphocytes Relative: 27 %
MCH: 32 pg (ref 26.0–34.0)
MCHC: 32.8 g/dL (ref 30.0–36.0)
MCV: 97.7 fL (ref 78.0–100.0)
MONOS PCT: 12 %
Monocytes Absolute: 0.5 10*3/uL (ref 0.1–1.0)
NEUTROS PCT: 60 %
Neutro Abs: 2.4 10*3/uL (ref 1.7–7.7)
PLATELETS: 54 10*3/uL — AB (ref 150–400)
RBC: 3.03 MIL/uL — ABNORMAL LOW (ref 3.87–5.11)
RDW: 20.7 % — AB (ref 11.5–15.5)
WBC: 3.9 10*3/uL — ABNORMAL LOW (ref 4.0–10.5)

## 2016-10-05 LAB — COMPREHENSIVE METABOLIC PANEL
ALBUMIN: 3.1 g/dL — AB (ref 3.5–5.0)
ALK PHOS: 71 U/L (ref 38–126)
ALT: 15 U/L (ref 14–54)
ANION GAP: 8 (ref 5–15)
AST: 28 U/L (ref 15–41)
BUN: 14 mg/dL (ref 6–20)
CALCIUM: 9.3 mg/dL (ref 8.9–10.3)
CO2: 25 mmol/L (ref 22–32)
Chloride: 102 mmol/L (ref 101–111)
Creatinine, Ser: 1.05 mg/dL — ABNORMAL HIGH (ref 0.44–1.00)
GFR calc non Af Amer: 54 mL/min — ABNORMAL LOW (ref >60)
GLUCOSE: 260 mg/dL — AB (ref 65–99)
POTASSIUM: 4.3 mmol/L (ref 3.5–5.1)
SODIUM: 135 mmol/L (ref 135–145)
Total Bilirubin: 0.6 mg/dL (ref 0.3–1.2)
Total Protein: 6.5 g/dL (ref 6.5–8.1)

## 2016-10-05 LAB — MAGNESIUM: Magnesium: 1.2 mg/dL — ABNORMAL LOW (ref 1.7–2.4)

## 2016-10-05 MED ORDER — MAGNESIUM OXIDE 400 (241.3 MG) MG PO TABS
400.0000 mg | ORAL_TABLET | Freq: Two times a day (BID) | ORAL | 2 refills | Status: DC
Start: 2016-10-05 — End: 2017-03-16

## 2016-10-05 MED ORDER — SODIUM CHLORIDE 0.9 % IV SOLN: 2.0000 g | Freq: Once | INTRAVENOUS | Status: DC

## 2016-10-05 MED ORDER — HEPARIN SOD (PORK) LOCK FLUSH 100 UNIT/ML IV SOLN
INTRAVENOUS | Status: AC
  Filled 2016-10-05: qty 5

## 2016-10-05 MED ORDER — MAGNESIUM SULFATE 2 GM/50ML IV SOLN
2.0000 g | Freq: Once | INTRAVENOUS | Status: AC
  Administered 2016-10-05: 2 g via INTRAVENOUS
  Filled 2016-10-05: qty 50

## 2016-10-05 MED ORDER — HEPARIN SOD (PORK) LOCK FLUSH 100 UNIT/ML IV SOLN
500.0000 [IU] | Freq: Once | INTRAVENOUS | Status: AC
  Administered 2016-10-05: 500 [IU] via INTRAVENOUS

## 2016-10-05 NOTE — Patient Instructions (Signed)
[facility] at [facility] Discharge Instructions  RECOMMENDATIONS MADE BY THE CONSULTANT AND ANY TEST RESULTS WILL BE SENT TO YOUR REFERRING PHYSICIAN.  Magnesium 2 gm infusion given today as ordered. No chemo today platelets are low. Return as scheduled.  Thank you for choosing [place] at [facility] to provide your oncology and hematology care.  To afford each patient quality time with our provider, please arrive at least 15 minutes before your scheduled appointment time.    If you have a lab appointment with the [facility] please come in thru the  Main Entrance and check in at the main information desk  You need to re-schedule your appointment should you arrive 10 or more minutes late.  We strive to give you quality time with our providers, and arriving late affects you and other patients whose appointments are after yours.  Also, if you no show three or more times for appointments you may be dismissed from the clinic at the providers discretion.     Again, thank you for choosing [facility].  Our hope is that these requests will decrease the amount of time that you wait before being seen by our physicians.       _____________________________________________________________  Should you have questions after your visit to [facility], please contact our office at [phone] between the hours of 8:30 a.m. and 4:30 p.m.  Voicemails left after 4:30 p.m. will not be returned until the following business day.  For prescription refill requests, have your pharmacy contact our office.       Resources For Cancer Patients and their Caregivers ? American Cancer Society: Can assist with transportation, wigs, general needs, runs Look Good Feel Better.        289-225-1572 ? Cancer Care: Provides financial assistance, online support groups, medication/co-pay assistance.  1-800-813-HOPE (586) 304-1764) ? [facility] Assists [place] cancer patients and their families through emotional , educational and financial support.  (351) 860-7800 ? [place] DSS Where to apply for food stamps, Medicaid and utility assistance. 339-670-2387 ? [facility]: Transportation to medical appointments. (613) 551-1780 ? Social Security Administration: May apply for disability if have a Stage IV cancer. 787-152-6769 205-538-2397 ? [employer], Disability and Transit Services: Assists with nutrition, care and transit needs. [place] Support Programs: @10RELATIVEDAYS @ > Cancer Support Group  2nd Tuesday of the month 1pm-2pm, Journey Room  > Creative Journey  3rd Tuesday of the month 1130am-1pm, Journey Room  > Look Good Feel Better  1st Wednesday of the month 10am-12 noon, Journey Room (Call [facility] to register [phone])

## 2016-10-05 NOTE — Patient Instructions (Addendum)
[place] at [facility] Discharge Instructions  RECOMMENDATIONS MADE BY THE CONSULTANT AND ANY TEST RESULTS WILL BE SENT TO YOUR REFERRING PHYSICIAN.  You saw Mike Craze, NP, today Return in 2 weeks for follow up and treatment. See Amy at checkout for appointments.  Thank you for choosing [place] at [facility] to provide your oncology and hematology care.  To afford each patient quality time with our provider, please arrive at least 15 minutes before your scheduled appointment time.    If you have a lab appointment with the [place] please come in thru the  Main Entrance and check in at the main information desk  You need to re-schedule your appointment should you arrive 10 or more minutes late.  We strive to give you quality time with our providers, and arriving late affects you and other patients whose appointments are after yours.  Also, if you no show three or more times for appointments you may be dismissed from the clinic at the providers discretion.     Again, thank you for choosing [facility].  Our hope is that these requests will decrease the amount of time that you wait before being seen by our physicians.       _____________________________________________________________  Should you have questions after your visit to [facility], please contact our office at [phone] between the hours of 8:30 a.m. and 4:30 p.m.  Voicemails left after 4:30 p.m. will not be returned until the following business day.  For prescription refill requests, have your pharmacy contact our office.       Resources For Cancer Patients and their Caregivers ? American Cancer Society: Can assist with transportation, wigs, general needs, runs Look Good Feel Better.        325-730-5699 ? Cancer Care: Provides financial assistance, online support groups, medication/co-pay assistance.  1-800-813-HOPE 516-376-7828) ? [place] Assists [place] cancer patients and their families through emotional , educational and financial support.  289 832 2410 ? [place] DSS Where to apply for food stamps, Medicaid and utility assistance. 3678057280 ? [employer]: Transportation to medical appointments. (385)734-0584 ? Social Security Administration: May apply for disability if have a Stage IV cancer. 620-103-2334 925-434-9838 ? [employer], Disability and Transit Services: Assists with nutrition, care and transit needs. [place] Support Programs: @10RELATIVEDAYS @ > Cancer Support Group  2nd Tuesday of the month 1pm-2pm, Journey Room  > Creative Journey  3rd Tuesday of the month 1130am-1pm, Journey Room  > Look Good Feel Better  1st Wednesday of the month 10am-12 noon, Journey Room (Call [place] to register [phone])

## 2016-10-05 NOTE — Progress Notes (Signed)
Tolerated magnesium infusion well. Denies any complaints. Stable on discharge home with husband via wheelchair.

## 2016-10-12 DIAGNOSIS — E1165 Type 2 diabetes mellitus with hyperglycemia: Secondary | ICD-10-CM | POA: Diagnosis not present

## 2016-10-12 DIAGNOSIS — E1122 Type 2 diabetes mellitus with diabetic chronic kidney disease: Secondary | ICD-10-CM | POA: Diagnosis not present

## 2016-10-12 DIAGNOSIS — C541 Malignant neoplasm of endometrium: Secondary | ICD-10-CM | POA: Diagnosis not present

## 2016-10-12 DIAGNOSIS — C569 Malignant neoplasm of unspecified ovary: Secondary | ICD-10-CM | POA: Diagnosis not present

## 2016-10-12 DIAGNOSIS — G40311 Generalized idiopathic epilepsy and epileptic syndromes, intractable, with status epilepticus: Secondary | ICD-10-CM | POA: Diagnosis not present

## 2016-10-12 DIAGNOSIS — E1143 Type 2 diabetes mellitus with diabetic autonomic (poly)neuropathy: Secondary | ICD-10-CM | POA: Diagnosis not present

## 2016-10-12 DIAGNOSIS — E782 Mixed hyperlipidemia: Secondary | ICD-10-CM | POA: Diagnosis not present

## 2016-10-12 DIAGNOSIS — D61811 Other drug-induced pancytopenia: Secondary | ICD-10-CM | POA: Diagnosis not present

## 2016-10-14 ENCOUNTER — Encounter (HOSPITAL_COMMUNITY): Payer: Medicare HMO | Admitting: Genetic Counselor

## 2016-10-18 NOTE — Progress Notes (Signed)
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 [name] and associates, pathology could not be certain that these were not 2 separate primaries rather than one metastatic process from the uterus to the ovary.      08/15/2013 - 12/06/2013 Chemotherapy    Carboplatin/Taxol with dose reduction of 50% and Taxol due to grade 2+ peripheral neuropathy and gabapentin induced nightmare/hallucinations. 5 out of 6 cycles given with the final cycle being cancelled secondary to significant thrombocytopenia/intolerance.      09/10/2013 - 10/17/2013 Radiation Therapy    5040 cGy delivered by EBRT followed by intravaginal brachytherapy.      04/30/2014 PET scan    PET scan consistent with recurrent disease of either ovarian cancer or endometrial cancer.      06/03/2014 - 07/15/2014 Radiation Therapy    5040 cGy over 28 fractions delivered to the low periaortic area lymph node either metastatic endometrial or metastatic or ovarian cancer.      07/19/2014 Imaging    Imaging concerning for new mesenteric disease and subdiaphragmatic disease.      08/05/2014 PET scan    No evidence of progression of disease.      08/05/2014 Remission    Negative PET scan.      06/11/2016 PET scan    1. Hypermetabolic aortocaval and right common iliac adenopathy, maximum SUV 17.1, compatible with malignancy. 2. Calcified left mesenteric mass has only low-grade activity, maximum SUV 5.3, merit surveillance. 3. There several small calcifications along the omentum which are not currently metabolically active. 4.  Along the scarring in operative site from prior laparotomy there some faintly accentuated metabolic activity which is probably related to the original wound and unlikely to be from tumor. 5. Coronary, aortic arch, and branch vessel atherosclerotic vascular disease.      06/11/2016 Relapse/Recurrence    PET scan with aortocaval & right iliac adenopathy consistent with malignancy.       08/03/2016 -  Chemotherapy    Carboplatin Day 1/Gemzar Days 1&8 every 21 days       08/10/2016 Treatment Plan Change    Day 8 cycle 1 cancelled due to cytopenia      09/07/2016 Treatment Plan Change    Treatment delayed x 1 week due to thrombocytopenia.  Hypomagnesemia noted and replaced IV.      09/08/2016 - 09/12/2016 Hospital Admission    Admit date: 09/08/2016  Admission diagnosis: Seizure Additional comments: Intubated on 3/7 for airway protection and extubated on 09/09/2016.  Hospital course complicated by pancytopenia secondary to systemic chemotherapy.      09/08/2016 Imaging    EEG- This sedated EEG is abnormal due to diffuse slowing of the waking background.  Clinical Correlation of the above findings indicates diffuse cerebral dysfunction that is non-specific in etiology and can be seen with hypoxic/ischemic injury, toxic/metabolic encephalopathies, neurodegenerative disorders, or medication effect.  The absence of epileptiform discharges does not rule out a clinical diagnosis of epilepsy.  Clinical correlation is advised.      09/12/2016 Imaging    CT head- No metastatic disease or acute intracranial abnormality. Normal for  age CT appearance of the brain.      09/21/2016 Treatment Plan Change    Carboplatin deleted from treatment plan.  Will pursue single-agent Gemcitabine at reduced dose.         INTERVAL HISTORY:  Mrs. Umali presents today for consideration for next cycle chemotherapy.  Chemotherapy was held 2 weeks ago d/t thrombocytopenia.  She has not been able to receive  chemotherapy since 08/31/16 d/t cytopenias and recent hospitalization for new-onset seizures. She requires periodic blood and platelet transfusions.   She is here today with her husband. Reports that "I feel the best I've felt in a long time." She is smiling.   Overall, she feels like her usual self; largely no new complaints. Remains on Keppra as prescribed; no seizure activity noted by patient or her husband.  Her pain continues; "it's always there." She denies any improvement in symptoms with addition of Fentanyl patch, as well as dose-increase of Fentanyl patch.  She continues to struggle with pain to her back/hands/legs/feet. Continues on Percocet around-the-clock every 4 hours; "but sometimes I have to take 2 pills when the pain is really bad." States that as a result, she will run out of her current Percocet prescription by the end of the month, "but I'm not due for a refill until 11/04/16."     She continues to have periodic nausea; no vomiting. Denies fever or chills. Fatigue persists. Occasional leg swelling, "but it's better than usual today."    Reports that she recently removed her Fentanyl patch over the weekend and forgot to wash her hands afterwards; she touched her left eye and states "my eye got really big and swollen; it's just now getting a little better today."     REVIEW OF SYSTEMS:  Review of Systems  Constitutional: Positive for fatigue. Negative for chills and fever.  HENT:  Negative.  Negative for mouth sores and nosebleeds.   Eyes: Negative.        (L) eyelid swelling after removing Fentanyl patch over the weekend, then accidentally touching her eye.   Respiratory: Negative.  Negative for cough and shortness of breath.   Cardiovascular: Positive for leg swelling.  Gastrointestinal: Positive for nausea (alleviated with zofran). Negative for abdominal pain, blood in stool and diarrhea.          Endocrine: Negative.   Genitourinary: Negative.  Negative for dysuria,  hematuria and vaginal bleeding.   Musculoskeletal: Positive for arthralgias.       Denies falls (R) hip and buttock pain.   Skin: Negative.  Negative for rash.       Continued bruising to bilat arms since hospitalization, "where they had to stick me so many times."   Neurological: Positive for numbness ( neuropathy in hands, legs and feet. (chronic and largely unchanged)). Negative for dizziness and headaches.  Hematological: Negative.   Psychiatric/Behavioral: Negative.   All other systems reviewed and are negative.    PAST MEDICAL/SURGICAL HISTORY:  Past Medical History:  Diagnosis Date  . Anemia   . Chemotherapy induced neutropenia ([place])   . Chemotherapy induced thrombocytopenia   . Diabetes mellitus without complication ([place])   . Endometrial cancer ([place])   . Hot flashes   . Hypertension   . Malignant neoplasm ([place])   . Malignant neoplasm of ovary ([employer]) 04/30/2013  . Obesity   . Ovarian cancer ([place])   . Pancytopenia ([place])   . Peripheral artery disease ([place])   . Peripheral neuropathy   . Port-a-cath in place  No past surgical history on file.   SOCIAL HISTORY:  Social History   Social History  . Marital status: Married    Spouse name: N/A  . Number of children: N/A  . Years of education: N/A   Occupational History  . Not on file.   Social History Main Topics  . Smoking status: Never Smoker  . Smokeless tobacco: Never Used  . Alcohol use No  . Drug use: No  . Sexual activity: Not Currently   Other Topics Concern  . Not on file   Social History Narrative  . No narrative on file    FAMILY HISTORY:  Family History  Problem Relation Age of Onset  . Heart failure Mother   . Stroke Sister   . Cancer Sister   . Diabetes Sister   . Leukemia Sister   . Diabetes Brother     CURRENT MEDICATIONS:  Outpatient Encounter Prescriptions as of 10/19/2016  Medication Sig Note  . aspirin EC 325 MG tablet Take 325 mg by mouth daily.    . cetirizine (ZYRTEC) 10  MG tablet Take 10 mg by mouth daily.    . Cholecalciferol (VITAMIN D PO) Take 1 tablet by mouth daily.   Marland Kitchen glipiZIDE (GLUCOTROL) 10 MG tablet TAKE 1 TABLET BY MOUTH TWICE A DAY   . levETIRAcetam (KEPPRA) 500 MG tablet Take 1 tablet (500 mg total) by mouth 2 (two) times daily.   Marland Kitchen lisinopril (PRINIVIL,ZESTRIL) 40 MG tablet Take 40 mg by mouth daily.    Marland Kitchen loperamide (IMODIUM) 2 MG capsule Take 2 mg by mouth as needed for diarrhea or loose stools.    . magnesium oxide (MAG-OX) 400 (241.3 Mg) MG tablet Take 1 tablet (400 mg total) by mouth 2 (two) times daily.   . metFORMIN (GLUCOPHAGE) 1000 MG tablet Take 1,000 mg by mouth 2 (two) times daily with a meal.    . Omega-3 Fatty Acids (FISH OIL) 1000 MG CAPS Take 1,000 mg by mouth daily.    . ondansetron (ZOFRAN) 8 MG tablet Take 1 tablet (8 mg total) by mouth every 8 (eight) hours as needed for nausea or vomiting. 09/08/2016: Spouse states pt takes both  . ondansetron (ZOFRAN-ODT) 4 MG disintegrating tablet Take 4 mg by mouth every 8 (eight) hours as needed for nausea or vomiting.  09/08/2016: Spouse states pt takes both  . oxyCODONE-acetaminophen (PERCOCET) 10-325 MG tablet Take 1-2 tablets by mouth every 4 (four) hours as needed for pain.   . Polyethylene Glycol 3350 (MIRALAX PO) Take 17 g by mouth daily as needed for moderate constipation.    . prochlorperazine (COMPAZINE) 10 MG tablet Take 1 tablet (10 mg total) by mouth every 6 (six) hours as needed for nausea or vomiting.   . [DISCONTINUED] oxyCODONE-acetaminophen (PERCOCET) 10-325 MG tablet Take 1 tablet by mouth every 4 (four) hours as needed for pain.   . [DISCONTINUED] oxyCODONE-acetaminophen (PERCOCET) 10-325 MG tablet Take 1 tablet by mouth every 4 (four) hours as needed for pain.   . [DISCONTINUED] fentaNYL (DURAGESIC - DOSED MCG/HR) 25 MCG/HR patch Place 25 mcg onto the skin every 3 (three) days.   . [DISCONTINUED] lidocaine-prilocaine (EMLA) cream Apply a quarter size amount to affected area 1  hour prior to coming to chemotherapy. (Patient not taking: Reported on 10/05/2016)    No facility-administered encounter medications on file as of 10/19/2016.      ALLERGIES:  Allergies  Allergen Reactions  . Diphenhydramine Palpitations  . Iodinated Diagnostic Agents Other (See Comments)  Unknown  . Duloxetine Hcl Nausea And Vomiting  . Ibuprofen Other (See Comments)    Makes my bones hurt  . Penicillins Rash       PHYSICAL EXAM:  ECOG Performance status: 2-3 - Symptomatic; requires assistance with ADLs   Vitals:   10/19/16 1020  BP: (!) 134/50  Pulse: 81  Resp: 18  Temp: 97.8 F (36.6 C)   Filed Weights   10/19/16 1020  Weight: 246 lb 8 oz (111.8 kg)     Physical Exam  Constitutional: She is oriented to person, place, and time and well-developed, well-nourished, and in no distress.  Seen in wheelchair  HENT:  Head: Normocephalic and atraumatic.  Mouth/Throat: Oropharynx is clear and moist. No oropharyngeal exudate.  Eyes: Conjunctivae are normal. Pupils are equal, round, and reactive to light. No scleral icterus.  Very mild (L) upper lid edema.   Neck: Normal range of motion. Neck supple.  Cardiovascular: Normal rate, regular rhythm and normal heart sounds.   Pulmonary/Chest: Effort normal and breath sounds normal. No respiratory distress. She has no wheezes. She has no rales.  Abdominal: Soft. Bowel sounds are normal. There is no tenderness. There is no guarding.  Musculoskeletal: Normal range of motion. She exhibits edema (left hand edema (chronic since 2013 after a fracture per patient) ).  1+ pitting edema in bilateral legs/ankles/feet (improved from previous)  Lymphadenopathy:    She has no cervical adenopathy.  Neurological: She is alert and oriented to person, place, and time.  Skin: Skin is warm and dry. No rash noted. There is pallor.  Healing ecchymosis noted to bilateral forearms, in various stages of healing.   Psychiatric: Mood, memory, affect  and judgment normal.  Nursing note and vitals reviewed.    LABORATORY DATA:  I have reviewed the labs as listed.  CBC    Component Value Date/Time   WBC 5.4 10/19/2016 1052   RBC 3.33 (L) 10/19/2016 1052   HGB 10.9 (L) 10/19/2016 1052   HCT 32.9 (L) 10/19/2016 1052   PLT 72 (L) 10/19/2016 1052   MCV 98.8 10/19/2016 1052   MCH 32.7 10/19/2016 1052   MCHC 33.1 10/19/2016 1052   RDW 19.0 (H) 10/19/2016 1052   LYMPHSABS 1.3 10/19/2016 1052   MONOABS 0.4 10/19/2016 1052   EOSABS 0.0 10/19/2016 1052   BASOSABS 0.0 10/19/2016 1052   CMP Latest Ref Rng & Units 10/19/2016 10/05/2016 09/21/2016  Glucose 65 - 99 mg/dL 281(H) 260(H) 285(H)  BUN 6 - 20 mg/dL 20 14 12   Creatinine 0.44 - 1.00 mg/dL 0.99 1.05(H) 0.94  Sodium 135 - 145 mmol/L 133(L) 135 137  Potassium 3.5 - 5.1 mmol/L 4.2 4.3 4.2  Chloride 101 - 111 mmol/L 101 102 103  CO2 22 - 32 mmol/L 23 25 26   Calcium 8.9 - 10.3 mg/dL 9.1 9.3 8.8(L)  Total Protein 6.5 - 8.1 g/dL 6.8 6.5 6.2(L)  Total Bilirubin 0.3 - 1.2 mg/dL 0.6 0.6 1.0  Alkaline Phos 38 - 126 U/L 66 71 81  AST 15 - 41 U/L 32 28 26  ALT 14 - 54 U/L 17 15 16    Results for AYAANA, BIONDO (MRN [id number])   Ref. Range 10/19/2016 10:52  Magnesium Latest Ref Range: 1.7 - 2.4 mg/dL 1.6 (L)   PENDING LABS:    DIAGNOSTIC IMAGING:  I have personally reviewed the radiologic reports and agree with below results.   PET scan: 06/11/16 CLINICAL DATA:  Subsequent Treatment strategy for uterine and ovarian cancer with increasing tumor markers  appear.  EXAM: NUCLEAR MEDICINE PET SKULL BASE TO THIGH  TECHNIQUE: 12.3 mCi F-18 FDG was injected intravenously. Full-ring PET imaging was performed from the skull base to thigh after the radiotracer. CT data was obtained and used for attenuation correction and anatomic localization.  FASTING BLOOD GLUCOSE:  Value: 86 mg/dl  COMPARISON:  Multiple exams, including CT abdomen and pelvis  dated 07/19/2014  FINDINGS: NECK  No hypermetabolic lymph nodes in the neck.  CHEST  No hypermetabolic mediastinal or hilar nodes. No suspicious pulmonary nodules on the CT data. Coronary, aortic arch, and branch vessel atherosclerotic vascular disease.  ABDOMEN/PELVIS  No abnormal hypermetabolic activity within the liver, pancreas, adrenal glands, or spleen. Clustered right periaortic lymph nodes below the level of the renal veins, the primarily posterior to the IVC, measuring up to about 1.3 cm in short axis on image 130/4, maximum SUV 17.1. Abnormal hypermetabolic nodes extend into the right common iliac chain, index right iliac node 1.1 cm in short axis on image 143/4. These nodes are increased compared to 07/19/14.  Calcified left mesenteric mass measuring 3.3 by 2.2 cm, metabolic activity is similar to the surrounding small bowel, maximum SUV approximately 5.3  Along the surgical site in the right rectus abdominus there is some very faintly increased activity, maximum SUV 4.0, probably simply postoperative.  Faint curvilinear calcification in the left upper quadrant near the splenic flexure on image 122/4 is not hypermetabolic and a least 6 mm in diameter.  There some small calcifications along the omentum.  Chronic presacral edema.  Uterus and ovaries absent.  SKELETON  No focal hypermetabolic activity to suggest skeletal metastasis.  IMPRESSION: 1. Hypermetabolic aortocaval and right common iliac adenopathy, maximum SUV 17.1, compatible with malignancy. 2. Calcified left mesenteric mass has only low-grade activity, maximum SUV 5.3, merit surveillance. 3. There several small calcifications along the omentum which are not currently metabolically active. 4. Along the scarring in operative site from prior laparotomy there some faintly accentuated metabolic activity which is probably related to the original wound and unlikely to be from tumor. 5.  Coronary, aortic arch, and branch vessel atherosclerotic vascular disease.   Electronically Signed   By: Van Clines M.D.   On: 06/11/2016 13:05      PATHOLOGY:     ASSESSMENT & PLAN:  Kathleen Whitehead is a pleasant 66 y.o. 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 She went on to receive adjuvant chemotherapy with Carbo/Taxol x 5 cycles requiring 50% dose reduction d/t peripheral neuropathy; 6th cycle held d/t cytopenias and intolerance; she completed chemotherapy on 12/06/13. She also received intravaginal brachytherapy, which completed on 10/17/13.  Post treatment PET scan in 04/2014 consistent with recurrent disease. She went on to complete external beam radiation to the low periaortic area lymph node 28 fractions, and completing radiation on 07/15/14. She was noted to be in remission on PET imaging on 08/05/14.  Subsequent PET scan on 06/11/16, revealed hypermetabolic aortocaval and right common iliac adenopathy compatible with malignancy.  She began chemotherapy with Carbo/Gemzar on 07/2016. There have been several treatment delays d/t cytopenias.  From 09/08/16-09/12/16, she was hospitalized for seizure requiring intubation for airway protection. Carboplatin has since been discontinued from her treatment plan; we will attempt continued treatment with single-agent dose-reduced Gemzar. She is here today for consideration for next cycle of chemotherapy.     Endometrial/Ovarian cancer:  -Carboplatin discontinued from treatment plan following recent hospitalization for seizures and  significant pancytopenia. Single-agent dose-reduced Gemcitabine remains on treatment plan.  -Labs reviewed. Thrombocytopenia persists, but slightly improved.  Plan discussed with Dr. Talbert Cage; will proceed with cycle #3 single-agent dose-reduced Gemzar today.  Of note, her last chemotherapy was given on  08/31/16 and has been held since that time d/t cytopenias.  -Will order restaging PET scan to evaluate extent of her disease, particularly since she has been unable to receive chemotherapy since late February.  PET scan ordered and will try to arrange in 2 weeks prior to next cycle of chemotherapy.  -Return next week for day 8, cycle #3 chemotherapy (Gemzar alone).  Return to cancer center in 3 weeks for follow-up visit, review PET scan results, and consideration for cycle #4 chemotherapy.   Pancytopenia secondary to chemotherapy:  -Platelets 72,000 today. Denies any frank bleeding. No need for transfusion. Discussed with Dr. Talbert Cage. Will proceed with chemotherapy today.  Will add type & screen to next week's labs, in case she requires transfusion at that time.   -WBC recovered; [place] 3700.  -Hemoglobin remains low, but improved at 10.9 today. No need for transfusion today.   Hypomagnesemia: -Serum mag mildly low at 1.6 today. Unclear etiology of serial magnesium loss.  She is asymptomatic. Tolerating oral Mag Ox 400 mg po BID.  Will continue oral supplementation and forgo additional IV mag sulfate repletion today. Will recheck next week when she returns for day 8, cycle #3 chemotherapy.   Peripheral neuropathy & arthralgias, likely secondary to chemotherapy:  -Her pain is likely secondary to chemotherapy. -Increased dose of Fentanyl patch provided no clinical improvement in pain management.  Recommended she discontinue Fentanyl patches and she agrees.   -Previously unable to tolerate neuropathic pain medications including gabapentin, Lyrica, & Cymbalta.  -Continue Percocet 10/325 for breakthrough pain. Made adjustments to prescription so that she may take 1-2 tabs every 4 hours as needed and will not run out of medication. Post-dated paper prescription given to patient today. Percocet 10/325 mg, take 1-2 tabs po Q4Hprn, #200, no refills. This should be a 77-month's supply.  Early refill authorization  placed on prescription to allow it to be filled on 11/01/16 or after.      Constipation prevention in the setting of opiate use:  -Reinforced the importance of maintaining an adequate bowel regimen with stool softeners/gentle laxatives like Miralax, as appropriate, given increase in opiate therapy.   Nausea & Diarrhea, likely secondary to chemotherapy:  -Resolved -Continue Zofran/Compazine and Imodium, as needed.   Seizures:  -No seizure activity per patient/husband.  -Continue Keppra as prescribed.     Dispo:  -Return next week for day 8, cycle #3 single-agent Gemzar.  -Restaging PET scan in 2 weeks.  -Return to cancer center in 3 weeks for follow-up, review PET imaging, and consideration for next cycle chemotherapy.    All questions were answered to patient's stated satisfaction. Encouraged patient to call with any new concerns or questions before her next visit to the cancer center and we can certain see her sooner, if needed.    Plan of care discussed with Dr. Twana First, who agrees with the above aforementioned.    Orders placed this encounter:  Orders Placed This Encounter  Procedures  . NM PET Image Restag (PS) Skull Base To Thigh  . CBC with Differential/Platelet  . Comprehensive metabolic panel  . Magnesium  . Sample to [place], [place] [phone]

## 2016-10-19 ENCOUNTER — Encounter (HOSPITAL_BASED_OUTPATIENT_CLINIC_OR_DEPARTMENT_OTHER): Payer: Medicare HMO

## 2016-10-19 ENCOUNTER — Encounter (HOSPITAL_COMMUNITY): Payer: Medicare HMO | Attending: Adult Health | Admitting: Adult Health

## 2016-10-19 VITALS — BP 159/48 | HR 73 | Temp 98.1°F | Resp 18

## 2016-10-19 VITALS — BP 134/50 | HR 81 | Temp 97.8°F | Resp 18 | Wt 246.5 lb

## 2016-10-19 DIAGNOSIS — G629 Polyneuropathy, unspecified: Secondary | ICD-10-CM

## 2016-10-19 DIAGNOSIS — D61811 Other drug-induced pancytopenia: Secondary | ICD-10-CM

## 2016-10-19 DIAGNOSIS — D696 Thrombocytopenia, unspecified: Secondary | ICD-10-CM

## 2016-10-19 DIAGNOSIS — M2550 Pain in unspecified joint: Secondary | ICD-10-CM | POA: Diagnosis not present

## 2016-10-19 DIAGNOSIS — C569 Malignant neoplasm of unspecified ovary: Secondary | ICD-10-CM

## 2016-10-19 DIAGNOSIS — Z5111 Encounter for antineoplastic chemotherapy: Secondary | ICD-10-CM

## 2016-10-19 DIAGNOSIS — Z95828 Presence of other vascular implants and grafts: Secondary | ICD-10-CM | POA: Diagnosis not present

## 2016-10-19 DIAGNOSIS — C541 Malignant neoplasm of endometrium: Secondary | ICD-10-CM | POA: Diagnosis not present

## 2016-10-19 LAB — CBC WITH DIFFERENTIAL/PLATELET
BASOS ABS: 0 10*3/uL (ref 0.0–0.1)
Basophils Relative: 0 %
EOS ABS: 0 10*3/uL (ref 0.0–0.7)
EOS PCT: 1 %
HCT: 32.9 % — ABNORMAL LOW (ref 36.0–46.0)
Hemoglobin: 10.9 g/dL — ABNORMAL LOW (ref 12.0–15.0)
LYMPHS PCT: 23 %
Lymphs Abs: 1.3 10*3/uL (ref 0.7–4.0)
MCH: 32.7 pg (ref 26.0–34.0)
MCHC: 33.1 g/dL (ref 30.0–36.0)
MCV: 98.8 fL (ref 78.0–100.0)
MONO ABS: 0.4 10*3/uL (ref 0.1–1.0)
Monocytes Relative: 8 %
Neutro Abs: 3.7 10*3/uL (ref 1.7–7.7)
Neutrophils Relative %: 68 %
PLATELETS: 72 10*3/uL — AB (ref 150–400)
RBC: 3.33 MIL/uL — AB (ref 3.87–5.11)
RDW: 19 % — AB (ref 11.5–15.5)
WBC: 5.4 10*3/uL (ref 4.0–10.5)

## 2016-10-19 LAB — MAGNESIUM: MAGNESIUM: 1.6 mg/dL — AB (ref 1.7–2.4)

## 2016-10-19 LAB — COMPREHENSIVE METABOLIC PANEL
ALT: 17 U/L (ref 14–54)
AST: 32 U/L (ref 15–41)
Albumin: 3.1 g/dL — ABNORMAL LOW (ref 3.5–5.0)
Alkaline Phosphatase: 66 U/L (ref 38–126)
Anion gap: 9 (ref 5–15)
BUN: 20 mg/dL (ref 6–20)
CHLORIDE: 101 mmol/L (ref 101–111)
CO2: 23 mmol/L (ref 22–32)
Calcium: 9.1 mg/dL (ref 8.9–10.3)
Creatinine, Ser: 0.99 mg/dL (ref 0.44–1.00)
GFR, EST NON AFRICAN AMERICAN: 58 mL/min — AB (ref >60)
Glucose, Bld: 281 mg/dL — ABNORMAL HIGH (ref 65–99)
POTASSIUM: 4.2 mmol/L (ref 3.5–5.1)
SODIUM: 133 mmol/L — AB (ref 135–145)
Total Bilirubin: 0.6 mg/dL (ref 0.3–1.2)
Total Protein: 6.8 g/dL (ref 6.5–8.1)

## 2016-10-19 MED ORDER — SODIUM CHLORIDE 0.9 % IV SOLN: 10.0000 mg | Freq: Once | INTRAVENOUS | Status: DC

## 2016-10-19 MED ORDER — DEXAMETHASONE SODIUM PHOSPHATE 10 MG/ML IJ SOLN
INTRAMUSCULAR | Status: AC
  Filled 2016-10-19: qty 1

## 2016-10-19 MED ORDER — SODIUM CHLORIDE 0.9% FLUSH
10.0000 mL | INTRAVENOUS | Status: DC | PRN
  Administered 2016-10-19: 10 mL
  Filled 2016-10-19: qty 10

## 2016-10-19 MED ORDER — DEXAMETHASONE SODIUM PHOSPHATE 10 MG/ML IJ SOLN
10.0000 mg | Freq: Once | INTRAMUSCULAR | Status: AC
  Administered 2016-10-19: 10 mg via INTRAVENOUS

## 2016-10-19 MED ORDER — SODIUM CHLORIDE 0.9 % IV SOLN
1400.0000 mg | Freq: Once | INTRAVENOUS | Status: AC
  Administered 2016-10-19: 1400 mg via INTRAVENOUS
  Filled 2016-10-19: qty 10.52

## 2016-10-19 MED ORDER — PALONOSETRON HCL INJECTION 0.25 MG/5ML
INTRAVENOUS | Status: AC
  Filled 2016-10-19: qty 5

## 2016-10-19 MED ORDER — OXYCODONE-ACETAMINOPHEN 10-325 MG PO TABS: 1.0000 | ORAL_TABLET | ORAL | 0 refills | Status: DC | PRN

## 2016-10-19 MED ORDER — PALONOSETRON HCL INJECTION 0.25 MG/5ML
0.2500 mg | Freq: Once | INTRAVENOUS | Status: AC
  Administered 2016-10-19: 0.25 mg via INTRAVENOUS

## 2016-10-19 MED ORDER — HEPARIN SOD (PORK) LOCK FLUSH 100 UNIT/ML IV SOLN
500.0000 [IU] | Freq: Once | INTRAVENOUS | Status: AC | PRN
  Administered 2016-10-19: 500 [IU]

## 2016-10-19 MED ORDER — SODIUM CHLORIDE 0.9 % IV SOLN
Freq: Once | INTRAVENOUS | Status: AC
  Administered 2016-10-19: 12:00:00 via INTRAVENOUS

## 2016-10-19 MED ORDER — OXYCODONE-ACETAMINOPHEN 10-325 MG PO TABS
1.0000 | ORAL_TABLET | ORAL | 0 refills | Status: DC | PRN
Start: 2016-10-19 — End: 2016-10-19

## 2016-10-19 NOTE — Progress Notes (Signed)
Labs reviewed with MD. Proceed with treatment per MD. Chemotherapy given today per orders. Patient tolerated it well. Vitals stable. Follow up as scheduled.

## 2016-10-19 NOTE — Patient Instructions (Signed)
[facility] Discharge Instructions for Patients Receiving Chemotherapy   Beginning January 23rd 2017 lab work for the Cancer Center will be done in the  Main lab at [facility] on 1st floor. If you have a lab appointment with the Cancer Center please come in thru the  Main Entrance and check in at the main information desk   Today you received the following chemotherapy agents   To help prevent nausea and vomiting after your treatment, we encourage you to take your nausea medication     If you develop nausea and vomiting, or diarrhea that is not controlled by your medication, call the clinic.  The clinic phone number is [phone]. Office hours are Monday-Friday 8:30am-5:00pm.  BELOW ARE SYMPTOMS THAT SHOULD BE REPORTED IMMEDIATELY:  *FEVER GREATER THAN 101.0 F  *CHILLS WITH OR WITHOUT FEVER  NAUSEA AND VOMITING THAT IS NOT CONTROLLED WITH YOUR NAUSEA MEDICATION  *UNUSUAL SHORTNESS OF BREATH  *UNUSUAL BRUISING OR BLEEDING  TENDERNESS IN MOUTH AND THROAT WITH OR WITHOUT PRESENCE OF ULCERS  *URINARY PROBLEMS  *BOWEL PROBLEMS  UNUSUAL RASH Items with * indicate a potential emergency and should be followed up as soon as possible. If you have an emergency after office hours please contact your primary care physician or go to the nearest emergency department.  Please call the clinic during office hours if you have any questions or concerns.   You may also contact the Patient Navigator at [phone] should you have any questions or need assistance in obtaining follow up care.      Resources For Cancer Patients and their Caregivers ? American Cancer Society: Can assist with transportation, wigs, general needs, runs Look Good Feel Better.        1-888-227-6333 ? Cancer Care: Provides financial assistance, online support groups, medication/co-pay assistance.  1-800-813-HOPE (4673) ? [facility] Assists [place] cancer  patients and their families through emotional , educational and financial support.  336-427-4357 ? [place] DSS Where to apply for food stamps, Medicaid and utility assistance. 336-342-1394 ? RCATS: Transportation to medical appointments. 336-347-2287 ? Social Security Administration: May apply for disability if have a Stage IV cancer. 336-342-7796 1-800-772-1213 ? [place] Aging, Disability and Transit Services: Assists with nutrition, care and transit needs. [phone]         

## 2016-10-20 LAB — CA 125: CA 125: 59.7 U/mL — ABNORMAL HIGH (ref 0.0–38.1)

## 2016-10-26 ENCOUNTER — Encounter (HOSPITAL_BASED_OUTPATIENT_CLINIC_OR_DEPARTMENT_OTHER): Payer: Medicare HMO

## 2016-10-26 ENCOUNTER — Encounter (HOSPITAL_COMMUNITY): Payer: Self-pay

## 2016-10-26 DIAGNOSIS — D696 Thrombocytopenia, unspecified: Secondary | ICD-10-CM

## 2016-10-26 DIAGNOSIS — C569 Malignant neoplasm of unspecified ovary: Secondary | ICD-10-CM | POA: Diagnosis not present

## 2016-10-26 DIAGNOSIS — C541 Malignant neoplasm of endometrium: Secondary | ICD-10-CM | POA: Diagnosis not present

## 2016-10-26 DIAGNOSIS — Z95828 Presence of other vascular implants and grafts: Secondary | ICD-10-CM | POA: Diagnosis not present

## 2016-10-26 LAB — COMPREHENSIVE METABOLIC PANEL
ALK PHOS: 66 U/L (ref 38–126)
ALT: 25 U/L (ref 14–54)
AST: 35 U/L (ref 15–41)
Albumin: 2.9 g/dL — ABNORMAL LOW (ref 3.5–5.0)
Anion gap: 9 (ref 5–15)
BILIRUBIN TOTAL: 0.4 mg/dL (ref 0.3–1.2)
BUN: 19 mg/dL (ref 6–20)
CO2: 24 mmol/L (ref 22–32)
Calcium: 8.9 mg/dL (ref 8.9–10.3)
Chloride: 101 mmol/L (ref 101–111)
Creatinine, Ser: 1.04 mg/dL — ABNORMAL HIGH (ref 0.44–1.00)
GFR calc Af Amer: >60 mL/min (ref >60)
GFR, EST NON AFRICAN AMERICAN: 54 mL/min — AB (ref >60)
Glucose, Bld: 305 mg/dL — ABNORMAL HIGH (ref 65–99)
Potassium: 4 mmol/L (ref 3.5–5.1)
SODIUM: 134 mmol/L — AB (ref 135–145)
TOTAL PROTEIN: 6.4 g/dL — AB (ref 6.5–8.1)

## 2016-10-26 LAB — CBC WITH DIFFERENTIAL/PLATELET
Basophils Absolute: 0 10*3/uL (ref 0.0–0.1)
Basophils Relative: 0 %
EOS ABS: 0 10*3/uL (ref 0.0–0.7)
Eosinophils Relative: 0 %
HCT: 29.5 % — ABNORMAL LOW (ref 36.0–46.0)
Hemoglobin: 10 g/dL — ABNORMAL LOW (ref 12.0–15.0)
LYMPHS ABS: 1.1 10*3/uL (ref 0.7–4.0)
LYMPHS PCT: 38 %
MCH: 33 pg (ref 26.0–34.0)
MCHC: 33.9 g/dL (ref 30.0–36.0)
MCV: 97.4 fL (ref 78.0–100.0)
MONOS PCT: 5 %
Monocytes Absolute: 0.1 10*3/uL (ref 0.1–1.0)
NEUTROS PCT: 57 %
Neutro Abs: 1.7 10*3/uL (ref 1.7–7.7)
Platelets: 32 10*3/uL — ABNORMAL LOW (ref 150–400)
RBC: 3.03 MIL/uL — AB (ref 3.87–5.11)
RDW: 17.1 % — ABNORMAL HIGH (ref 11.5–15.5)
WBC: 3 10*3/uL — ABNORMAL LOW (ref 4.0–10.5)

## 2016-10-26 LAB — MAGNESIUM: MAGNESIUM: 1.4 mg/dL — AB (ref 1.7–2.4)

## 2016-10-26 MED ORDER — HEPARIN SOD (PORK) LOCK FLUSH 100 UNIT/ML IV SOLN
500.0000 [IU] | Freq: Once | INTRAVENOUS | Status: AC
Start: 2016-10-26 — End: 2016-10-26
  Administered 2016-10-26: 500 [IU] via INTRAVENOUS

## 2016-10-26 MED ORDER — HEPARIN SOD (PORK) LOCK FLUSH 100 UNIT/ML IV SOLN
INTRAVENOUS | Status: AC
  Filled 2016-10-26: qty 5

## 2016-10-26 MED ORDER — SODIUM CHLORIDE 0.9 % IV SOLN
INTRAVENOUS | Status: DC
  Administered 2016-10-26: 12:00:00 via INTRAVENOUS

## 2016-10-26 MED ORDER — SODIUM CHLORIDE 0.9 % IV SOLN
2.0000 g | Freq: Once | INTRAVENOUS | Status: DC
  Filled 2016-10-26: qty 4

## 2016-10-26 MED ORDER — MAGNESIUM SULFATE 2 GM/50ML IV SOLN
2.0000 g | Freq: Once | INTRAVENOUS | Status: AC
  Administered 2016-10-26: 2 g via INTRAVENOUS
  Filled 2016-10-26: qty 50

## 2016-10-26 NOTE — Progress Notes (Signed)
Labs reviewed with T. Kefalas, PA-C.  Will hold tx today - pt to return for Cycle 4 Day 1 per PA. Order rec'd for Magnesium 2 g IV.  Pt made aware of plan and is agreeable.   Tolerated Mg++ infusion w/o adverse reaction.  Alert, in no distress.  VSS.  Discharged via wheelchair in c/o spouse.

## 2016-10-26 NOTE — Patient Instructions (Signed)
[place] at [facility] Discharge Instructions  RECOMMENDATIONS MADE BY THE CONSULTANT AND ANY TEST RESULTS WILL BE SENT TO YOUR REFERRING PHYSICIAN.  Treatment cancelled today. IV Magnesium given today. Return as scheduled for Cycle 4, Day 1.  If you have any unusual bruising or uncontrolled bleeding, contact the clinic or go to the Emergency Department.  Call the clinic should you have any questions or concerns.  Thank you for choosing [place] at [facility] to provide your oncology and hematology care.  To afford each patient quality time with our provider, please arrive at least 15 minutes before your scheduled appointment time.    If you have a lab appointment with the [facility] please come in thru the  Main Entrance and check in at the main information desk  You need to re-schedule your appointment should you arrive 10 or more minutes late.  We strive to give you quality time with our providers, and arriving late affects you and other patients whose appointments are after yours.  Also, if you no show three or more times for appointments you may be dismissed from the clinic at the providers discretion.     Again, thank you for choosing [facility].  Our hope is that these requests will decrease the amount of time that you wait before being seen by our physicians.       _____________________________________________________________  Should you have questions after your visit to [facility], please contact our office at [phone] between the hours of 8:30 a.m. and 4:30 p.m.  Voicemails left after 4:30 p.m. will not be returned until the following business day.  For prescription refill requests, have your pharmacy contact our office.       Resources For Cancer Patients and their Caregivers ? American Cancer Society: Can assist with transportation, wigs, general needs, runs Look Good Feel Better.         541-276-8595 ? Cancer Care: Provides financial assistance, online support groups, medication/co-pay assistance.  1-800-813-HOPE 712-735-1943) ? [place] Assists [place] cancer patients and their families through emotional , educational and financial support.  (680)194-9495 ? [place] DSS Where to apply for food stamps, Medicaid and utility assistance. 650-539-7768 ? RCATS: Transportation to medical appointments. (949)280-1206 ? Social Security Administration: May apply for disability if have a Stage IV cancer. (929)466-1276 937-827-7117 ? [employer], Disability and Transit Services: Assists with nutrition, care and transit needs. [place] Support Programs: @10RELATIVEDAYS @ > Cancer Support Group  2nd Tuesday of the month 1pm-2pm, Journey Room  > Creative Journey  3rd Tuesday of the month 1130am-1pm, Journey Room  > Look Good Feel Better  1st Wednesday of the month 10am-12 noon, Journey Room (Call [place] to register [phone])

## 2016-10-28 ENCOUNTER — Encounter (HOSPITAL_COMMUNITY): Payer: Self-pay | Admitting: Adult Health

## 2016-10-28 NOTE — Progress Notes (Signed)
Peer-to-Peer for PET scan completed with Dr. Mallie Mussel.   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    Authorization #: [id number]  Will forward to Lendell Caprice, patient advocate for her records.    Mike Craze, NP [place] [phone]

## 2016-11-01 ENCOUNTER — Telehealth (HOSPITAL_COMMUNITY): Payer: Self-pay | Admitting: Emergency Medicine

## 2016-11-01 ENCOUNTER — Other Ambulatory Visit (HOSPITAL_COMMUNITY): Payer: Self-pay | Admitting: Emergency Medicine

## 2016-11-01 ENCOUNTER — Telehealth (HOSPITAL_COMMUNITY): Payer: Self-pay | Admitting: *Deleted

## 2016-11-01 DIAGNOSIS — C569 Malignant neoplasm of unspecified ovary: Secondary | ICD-10-CM

## 2016-11-01 MED ORDER — OXYCODONE-ACETAMINOPHEN 10-325 MG PO TABS: 1.0000 | ORAL_TABLET | ORAL | 0 refills | Status: DC | PRN

## 2016-11-01 NOTE — Telephone Encounter (Signed)
Pt called and stated that the oxycodone with 200 pills needed a prior authorization and [facility] told her it would not be ready today.  Pt is out of pain medication.  Spoke with Gershon Mussel to see if we could just write a script for her 180 pills.  It is ready at the nurses desk for her to pick up.  Pt verbalized understanding.

## 2016-11-02 ENCOUNTER — Encounter (HOSPITAL_COMMUNITY): Payer: Self-pay | Admitting: Radiology

## 2016-11-02 ENCOUNTER — Encounter (HOSPITAL_COMMUNITY)
Admission: RE | Admit: 2016-11-02 | Discharge: 2016-11-02 | Disposition: A | Discharge status: Home / Self Care | Payer: Medicare HMO | Source: Ambulatory Visit | Attending: Adult Health | Admitting: Adult Health

## 2016-11-02 DIAGNOSIS — C569 Malignant neoplasm of unspecified ovary: Secondary | ICD-10-CM | POA: Diagnosis not present

## 2016-11-02 DIAGNOSIS — C541 Malignant neoplasm of endometrium: Secondary | ICD-10-CM | POA: Diagnosis not present

## 2016-11-02 LAB — GLUCOSE, CAPILLARY: GLUCOSE-CAPILLARY: 139 mg/dL — AB (ref 65–99)

## 2016-11-02 MED ORDER — FLUDEOXYGLUCOSE F - 18 (FDG) INJECTION: 12.2000 | Freq: Once | INTRAVENOUS | Status: DC | PRN

## 2016-11-09 ENCOUNTER — Encounter (HOSPITAL_BASED_OUTPATIENT_CLINIC_OR_DEPARTMENT_OTHER): Payer: Medicare HMO | Admitting: Oncology

## 2016-11-09 ENCOUNTER — Encounter (HOSPITAL_COMMUNITY): Payer: Medicare HMO | Attending: Oncology

## 2016-11-09 ENCOUNTER — Encounter (HOSPITAL_COMMUNITY): Payer: Self-pay

## 2016-11-09 DIAGNOSIS — D61810 Antineoplastic chemotherapy induced pancytopenia: Secondary | ICD-10-CM | POA: Diagnosis not present

## 2016-11-09 DIAGNOSIS — C569 Malignant neoplasm of unspecified ovary: Secondary | ICD-10-CM

## 2016-11-09 DIAGNOSIS — D696 Thrombocytopenia, unspecified: Secondary | ICD-10-CM | POA: Diagnosis not present

## 2016-11-09 LAB — COMPREHENSIVE METABOLIC PANEL
ALT: 16 U/L (ref 14–54)
ANION GAP: 10 (ref 5–15)
AST: 27 U/L (ref 15–41)
Albumin: 3.1 g/dL — ABNORMAL LOW (ref 3.5–5.0)
Alkaline Phosphatase: 68 U/L (ref 38–126)
BUN: 18 mg/dL (ref 6–20)
CHLORIDE: 102 mmol/L (ref 101–111)
CO2: 25 mmol/L (ref 22–32)
Calcium: 9.5 mg/dL (ref 8.9–10.3)
Creatinine, Ser: 0.91 mg/dL (ref 0.44–1.00)
Glucose, Bld: 205 mg/dL — ABNORMAL HIGH (ref 65–99)
POTASSIUM: 3.9 mmol/L (ref 3.5–5.1)
SODIUM: 137 mmol/L (ref 135–145)
Total Bilirubin: 0.5 mg/dL (ref 0.3–1.2)
Total Protein: 6.7 g/dL (ref 6.5–8.1)

## 2016-11-09 LAB — CBC WITH DIFFERENTIAL/PLATELET
Basophils Absolute: 0 10*3/uL (ref 0.0–0.1)
Basophils Relative: 0 %
EOS ABS: 0.1 10*3/uL (ref 0.0–0.7)
Eosinophils Relative: 2 %
HCT: 30.6 % — ABNORMAL LOW (ref 36.0–46.0)
Hemoglobin: 10.5 g/dL — ABNORMAL LOW (ref 12.0–15.0)
LYMPHS ABS: 1 10*3/uL (ref 0.7–4.0)
LYMPHS PCT: 24 %
MCH: 34 pg (ref 26.0–34.0)
MCHC: 34.3 g/dL (ref 30.0–36.0)
MCV: 99 fL (ref 78.0–100.0)
MONO ABS: 0.5 10*3/uL (ref 0.1–1.0)
Monocytes Relative: 12 %
NEUTROS ABS: 2.8 10*3/uL (ref 1.7–7.7)
NEUTROS PCT: 63 %
PLATELETS: 62 10*3/uL — AB (ref 150–400)
RBC: 3.09 MIL/uL — AB (ref 3.87–5.11)
RDW: 17.2 % — AB (ref 11.5–15.5)
WBC: 4.4 10*3/uL (ref 4.0–10.5)

## 2016-11-09 MED ORDER — HEPARIN SOD (PORK) LOCK FLUSH 100 UNIT/ML IV SOLN
INTRAVENOUS | Status: AC
  Filled 2016-11-09: qty 5

## 2016-11-09 MED ORDER — HEPARIN SOD (PORK) LOCK FLUSH 100 UNIT/ML IV SOLN
500.0000 [IU] | Freq: Once | INTRAVENOUS | Status: AC
  Administered 2016-11-09: 500 [IU] via INTRAVENOUS

## 2016-11-09 NOTE — Patient Instructions (Signed)
[place] at [facility] Discharge Instructions  RECOMMENDATIONS MADE BY THE CONSULTANT AND ANY TEST RESULTS WILL BE SENT TO YOUR REFERRING PHYSICIAN.  We held your treatment today due to low platelets. Follow up as scheduled.  Thank you for choosing [place] at [facility] to provide your oncology and hematology care.  To afford each patient quality time with our provider, please arrive at least 15 minutes before your scheduled appointment time.    If you have a lab appointment with the [place] please come in thru the  Main Entrance and check in at the main information desk  You need to re-schedule your appointment should you arrive 10 or more minutes late.  We strive to give you quality time with our providers, and arriving late affects you and other patients whose appointments are after yours.  Also, if you no show three or more times for appointments you may be dismissed from the clinic at the providers discretion.     Again, thank you for choosing [facility].  Our hope is that these requests will decrease the amount of time that you wait before being seen by our physicians.       _____________________________________________________________  Should you have questions after your visit to [facility], please contact our office at [phone] between the hours of 8:30 a.m. and 4:30 p.m.  Voicemails left after 4:30 p.m. will not be returned until the following business day.  For prescription refill requests, have your pharmacy contact our office.       Resources For Cancer Patients and their Caregivers ? American Cancer Society: Can assist with transportation, wigs, general needs, runs Look Good Feel Better.        272-600-4926 ? Cancer Care: Provides financial assistance, online support groups, medication/co-pay assistance.  1-800-813-HOPE 760-154-1099) ? [place] Assists  [place] cancer patients and their families through emotional , educational and financial support.  551-196-8085 ? [place] DSS Where to apply for food stamps, Medicaid and utility assistance. 438-186-0243 ? RCATS: Transportation to medical appointments. [phone] ? Social Security Administration: May apply for disability if have a Stage IV cancer. 442 468 8702 631-720-5645 ? [employer], Disability and Transit Services: Assists with nutrition, care and transit needs. [place] Support Programs: @10RELATIVEDAYS @ > Cancer Support Group  2nd Tuesday of the month 1pm-2pm, Journey Room  > Creative Journey  3rd Tuesday of the month 1130am-1pm, Journey Room  > Look Good Feel Better  1st Wednesday of the month 10am-12 noon, Journey Room (Call [place] to register [phone])

## 2016-11-09 NOTE — Patient Instructions (Addendum)
[place] at [facility] Discharge Instructions  RECOMMENDATIONS MADE BY THE CONSULTANT AND ANY TEST RESULTS WILL BE SENT TO YOUR REFERRING PHYSICIAN.  You were seen today by Dr. Twana First Follow up in 3 weeks with lab work  Thank you for choosing [place] at [facility] to provide your oncology and hematology care.  To afford each patient quality time with our provider, please arrive at least 15 minutes before your scheduled appointment time.    If you have a lab appointment with the [place] please come in thru the  Main Entrance and check in at the main information desk  You need to re-schedule your appointment should you arrive 10 or more minutes late.  We strive to give you quality time with our providers, and arriving late affects you and other patients whose appointments are after yours.  Also, if you no show three or more times for appointments you may be dismissed from the clinic at the providers discretion.     Again, thank you for choosing [facility].  Our hope is that these requests will decrease the amount of time that you wait before being seen by our physicians.       _____________________________________________________________  Should you have questions after your visit to [facility], please contact our office at [phone] between the hours of 8:30 a.m. and 4:30 p.m.  Voicemails left after 4:30 p.m. will not be returned until the following business day.  For prescription refill requests, have your pharmacy contact our office.       Resources For Cancer Patients and their Caregivers ? American Cancer Society: Can assist with transportation, wigs, general needs, runs Look Good Feel Better.        (980)088-2287 ? Cancer Care: Provides financial assistance, online support groups, medication/co-pay assistance.  1-800-813-HOPE 407-823-6832) ? [place] Assists  [place] cancer patients and their families through emotional , educational and financial support.  (567)750-3763 ? [place] DSS Where to apply for food stamps, Medicaid and utility assistance. (404)328-2004 ? RCATS: Transportation to medical appointments. 281-321-4519 ? Social Security Administration: May apply for disability if have a Stage IV cancer. [phone] 856-066-1104 ? [employer], Disability and Transit Services: Assists with nutrition, care and transit needs. [place] Support Programs: @10RELATIVEDAYS @ > Cancer Support Group  2nd Tuesday of the month 1pm-2pm, Journey Room  > Creative Journey  3rd Tuesday of the month 1130am-1pm, Journey Room  > Look Good Feel Better  1st Wednesday of the month 10am-12 noon, Journey Room (Call [place] to register [phone])

## 2016-11-09 NOTE — Progress Notes (Signed)
Held chemotherapy today per MD.

## 2016-11-09 NOTE — Addendum Note (Signed)
Addended by: Twana First on: 11/09/2016 04:22 PM   Modules accepted: Orders

## 2016-11-09 NOTE — Progress Notes (Signed)
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 [name] and associates, pathology could not be certain that these were not 2 separate primaries rather than one metastatic process from the uterus to the ovary.      08/15/2013 - 12/06/2013 Chemotherapy    Carboplatin/Taxol with dose reduction of 50% and Taxol due to grade 2+ peripheral neuropathy and gabapentin induced nightmare/hallucinations. 5 out of 6 cycles given with the final cycle being cancelled secondary to significant thrombocytopenia/intolerance.      09/10/2013 - 10/17/2013 Radiation Therapy    5040 cGy delivered by EBRT followed by intravaginal brachytherapy.      04/30/2014 PET scan    PET scan consistent with recurrent disease of either ovarian cancer or endometrial cancer.      06/03/2014 - 07/15/2014 Radiation Therapy    5040 cGy over 28 fractions delivered to the low periaortic area lymph node either metastatic endometrial or metastatic or ovarian cancer.      07/19/2014 Imaging    Imaging concerning for new mesenteric disease and subdiaphragmatic disease.      08/05/2014 PET scan    No evidence of progression of disease.      08/05/2014 Remission    Negative PET scan.      06/11/2016 PET scan    1. Hypermetabolic aortocaval and right common iliac adenopathy, maximum SUV 17.1, compatible with malignancy. 2. Calcified left mesenteric mass has only low-grade activity, maximum SUV 5.3, merit surveillance. 3. There several small calcifications along the omentum which are not currently metabolically active. 4.  Along the scarring in operative site from prior laparotomy there some faintly accentuated metabolic activity which is probably related to the original wound and unlikely to be from tumor. 5. Coronary, aortic arch, and branch vessel atherosclerotic vascular disease.      06/11/2016 Relapse/Recurrence    PET scan with aortocaval & right iliac adenopathy consistent with malignancy.       08/03/2016 -  Chemotherapy    Carboplatin Day 1/Gemzar Days 1&8 every 21 days       08/10/2016 Treatment Plan Change    Day 8 cycle 1 cancelled due to cytopenia      09/07/2016 Treatment Plan Change    Treatment delayed x 1 week due to thrombocytopenia.  Hypomagnesemia noted and replaced IV.      09/08/2016 - 09/12/2016 Hospital Admission    Admit date: 09/08/2016  Admission diagnosis: Seizure Additional comments: Intubated on 3/7 for airway protection and extubated on 09/09/2016.  Hospital course complicated by pancytopenia secondary to systemic chemotherapy.      09/08/2016 Imaging    EEG- This sedated EEG is abnormal due to diffuse slowing of the waking background.  Clinical Correlation of the above findings indicates diffuse cerebral dysfunction that is non-specific in etiology and can be seen with hypoxic/ischemic injury, toxic/metabolic encephalopathies, neurodegenerative disorders, or medication effect.  The absence of epileptiform discharges does not rule out a clinical diagnosis of epilepsy.  Clinical correlation is advised.      09/12/2016 Imaging    CT head- No metastatic disease or acute intracranial abnormality. Normal  for age CT appearance of the brain.      09/21/2016 Treatment Plan Change    Carboplatin deleted from treatment plan.  Will pursue single-agent Gemcitabine at reduced dose.         INTERVAL HISTORY:  Kathleen Whitehead presents today for consideration for next cycle chemotherapy.  Chemotherapy was held 2 weeks ago d/t thrombocytopenia.  She has not been able to receive  chemotherapy since 08/31/16 d/t cytopenias and recent hospitalization for new-onset seizures. She requires periodic blood and platelet transfusions.   She presents today in a wheelchair with her husband. I personally reviewed and went over PET scan and labs with the patient. PET scans from 11/02/2016 demonstrated New enlarged and hypermetabolic 1.2 cm left supraclavicular lymph node compatible with progressive metastatic adenopathy. Persistent hypermetabolic retroperitoneal tumor along the distal aortocaval lymph node chain. Not significantly changed from previous exam.  She states she continues to have fatigue. It has not gotten worse. She reports abdominal pain that comes and goes.    She notes she is not in a wheelchair all the time, only if she has to walk long distances.   Denies blood in stool, bleeding of gums, and hematuria. She states she bruises easily and that she has bruises on her arms where they had to "stick her over and over".    REVIEW OF SYSTEMS:  Review of Systems  Constitutional: Positive for fatigue.  HENT:  Negative.  Negative for mouth sores and nosebleeds.   Eyes: Negative.   Respiratory: Negative.  Negative for shortness of breath.   Gastrointestinal: Positive for abdominal pain (intermittent). Negative for blood in stool.          Endocrine: Negative.   Genitourinary: Negative.  Negative for hematuria.   Skin: Negative.        Continued bruising to bilat arms since hospitalization, "where they had to stick me so many times."   Neurological: Positive for numbness ( neuropathy in hands, legs and feet. (chronic and largely unchanged)).  Hematological: Bruises/bleeds easily.  Psychiatric/Behavioral: Negative.   All other systems reviewed and are negative.    PAST MEDICAL/SURGICAL HISTORY:  Past Medical History:  Diagnosis Date  . Anemia   . Chemotherapy induced neutropenia ([facility])   . Chemotherapy induced thrombocytopenia   . Diabetes mellitus without  complication ([facility])   . Endometrial cancer ([place])   . Hot flashes   . Hypertension   . Malignant neoplasm ([place])   . Malignant neoplasm of ovary ([facility]) 04/30/2013  . Obesity   . Ovarian cancer ([facility])   . Pancytopenia ([facility])   . Peripheral artery disease ([facility])   . Peripheral neuropathy   . Port-a-cath in place    History reviewed. No pertinent surgical history.   SOCIAL HISTORY:  Social History   Social History  . Marital status: Married    Spouse name: N/A  . Number of children: N/A  . Years of education: N/A   Occupational History  . Not on file.   Social History Main Topics  . Smoking status: Never Smoker  . Smokeless tobacco: Never Used  . Alcohol use No  . Drug use: No  . Sexual activity: Not Currently   Other Topics Concern  . Not on file   Social History Narrative  . No narrative on file    FAMILY HISTORY:  Family History  Problem Relation Age of Onset  . Heart failure Mother   . Stroke Sister   . Cancer Sister   . Diabetes Sister   .  Leukemia Sister   . Diabetes Brother     CURRENT MEDICATIONS:  Outpatient Encounter Prescriptions as of 10/19/2016  Medication Sig Note  . aspirin EC 325 MG tablet Take 325 mg by mouth daily.    . cetirizine (ZYRTEC) 10 MG tablet Take 10 mg by mouth daily.    . Cholecalciferol (VITAMIN D PO) Take 1 tablet by mouth daily.   Marland Kitchen glipiZIDE (GLUCOTROL) 10 MG tablet TAKE 1 TABLET BY MOUTH TWICE A DAY   . levETIRAcetam (KEPPRA) 500 MG tablet Take 1 tablet (500 mg total) by mouth 2 (two) times daily.   Marland Kitchen lisinopril (PRINIVIL,ZESTRIL) 40 MG tablet Take 40 mg by mouth daily.    Marland Kitchen loperamide (IMODIUM) 2 MG capsule Take 2 mg by mouth as needed for diarrhea or loose stools.    . magnesium oxide (MAG-OX) 400 (241.3 Mg) MG tablet Take 1 tablet (400 mg total) by mouth 2 (two) times daily.   . metFORMIN (GLUCOPHAGE) 1000 MG tablet Take 1,000 mg by mouth 2 (two) times daily with a meal.    . Omega-3 Fatty Acids (FISH OIL) 1000 MG CAPS Take  1,000 mg by mouth daily.    . ondansetron (ZOFRAN) 8 MG tablet Take 1 tablet (8 mg total) by mouth every 8 (eight) hours as needed for nausea or vomiting. 09/08/2016: Spouse states pt takes both  . ondansetron (ZOFRAN-ODT) 4 MG disintegrating tablet Take 4 mg by mouth every 8 (eight) hours as needed for nausea or vomiting.  09/08/2016: Spouse states pt takes both  . oxyCODONE-acetaminophen (PERCOCET) 10-325 MG tablet Take 1-2 tablets by mouth every 4 (four) hours as needed for pain.   . Polyethylene Glycol 3350 (MIRALAX PO) Take 17 g by mouth daily as needed for moderate constipation.    . prochlorperazine (COMPAZINE) 10 MG tablet Take 1 tablet (10 mg total) by mouth every 6 (six) hours as needed for nausea or vomiting.   . [DISCONTINUED] oxyCODONE-acetaminophen (PERCOCET) 10-325 MG tablet Take 1 tablet by mouth every 4 (four) hours as needed for pain.   . [DISCONTINUED] oxyCODONE-acetaminophen (PERCOCET) 10-325 MG tablet Take 1 tablet by mouth every 4 (four) hours as needed for pain.   . [DISCONTINUED] fentaNYL (DURAGESIC - DOSED MCG/HR) 25 MCG/HR patch Place 25 mcg onto the skin every 3 (three) days.   . [DISCONTINUED] lidocaine-prilocaine (EMLA) cream Apply a quarter size amount to affected area 1 hour prior to coming to chemotherapy. (Patient not taking: Reported on 10/05/2016)    No facility-administered encounter medications on file as of 10/19/2016.      ALLERGIES:  Allergies  Allergen Reactions  . Diphenhydramine Palpitations  . Iodinated Diagnostic Agents Other (See Comments)    Unknown  . Duloxetine Hcl Nausea And Vomiting  . Ibuprofen Other (See Comments)    Makes my bones hurt  . Penicillins Rash       PHYSICAL EXAM:  ECOG Performance status: 2-3 - Symptomatic; requires assistance with ADLs     Physical Exam  Constitutional: She is oriented to person, place, and time and well-developed, well-nourished, and in no distress.  Seen in wheelchair  HENT:  Head: Normocephalic  and atraumatic.  Mouth/Throat: Oropharynx is clear and moist. No oropharyngeal exudate.  Eyes: Conjunctivae and EOM are normal. Pupils are equal, round, and reactive to light. No scleral icterus.  Neck: Normal range of motion. Neck supple.  Cardiovascular: Normal rate, regular rhythm and normal heart sounds.   Pulmonary/Chest: Effort normal and breath sounds normal. No respiratory distress. She has  no wheezes. She has no rales.  Abdominal: Soft. Bowel sounds are normal. There is no tenderness. There is no guarding.  Musculoskeletal: Normal range of motion.  Lymphadenopathy:    She has no cervical adenopathy.  Neurological: She is alert and oriented to person, place, and time. Gait normal.  Skin: Skin is warm and dry. No rash noted. No pallor.  Psychiatric: Mood, memory, affect and judgment normal.  Nursing note and vitals reviewed.    LABORATORY DATA:  I have reviewed the labs as listed.  CBC    Component Value Date/Time   WBC 4.4 11/09/2016 1303   RBC 3.09 (L) 11/09/2016 1303   HGB 10.5 (L) 11/09/2016 1303   HCT 30.6 (L) 11/09/2016 1303   PLT PENDING 11/09/2016 1303   MCV 99.0 11/09/2016 1303   MCH 34.0 11/09/2016 1303   MCHC 34.3 11/09/2016 1303   RDW 17.2 (H) 11/09/2016 1303   LYMPHSABS 1.0 11/09/2016 1303   MONOABS 0.5 11/09/2016 1303   EOSABS 0.1 11/09/2016 1303   BASOSABS 0.0 11/09/2016 1303   CMP Latest Ref Rng & Units 11/09/2016 10/26/2016 10/19/2016  Glucose 65 - 99 mg/dL 205(H) 305(H) 281(H)  BUN 6 - 20 mg/dL _0 Creatinine 0.44 - 1.00 mg/dL 0.91 1.04(H) 0.99  Sodium 135 - 145 mmol/L 137 134(L) 133(L)  Potassium 3.5 - 5.1 mmol/L 3.9 4.0 4.2  Chloride 101 - 111 mmol/L 102 101 101  CO2 22 - 32 mmol/L _1 Calcium 8.9 - 10.3 mg/dL 9.5 8.9 9.1  Total Protein 6.5 - 8.1 g/dL 6.7 6.4(L) 6.8  Total Bilirubin 0.3 - 1.2 mg/dL 0.5 0.4 0.6  Alkaline Phos 38 - 126 U/L 68 66 66  AST 15 - 41 U/L 27 35 32  ALT 14 - 54 U/L _2 Results for Kathleen Whitehead, Kathleen Whitehead (MRN  [id number])   Ref. Range 10/19/2016 10:52  Magnesium Latest Ref Range: 1.7 - 2.4 mg/dL 1.6 (L)   PENDING LABS:    DIAGNOSTIC IMAGING:  I have personally reviewed the radiologic reports and agree with below results.   PET scan: 06/11/16 CLINICAL DATA:  Subsequent Treatment strategy for uterine and ovarian cancer with increasing tumor markers appear.  EXAM: NUCLEAR MEDICINE PET SKULL BASE TO THIGH  TECHNIQUE: 12.3 mCi F-18 FDG was injected intravenously. Full-ring PET imaging was performed from the skull base to thigh after the radiotracer. CT data was obtained and used for attenuation correction and anatomic localization.  FASTING BLOOD GLUCOSE:  Value: 86 mg/dl  COMPARISON:  Multiple exams, including CT abdomen and pelvis dated 07/19/2014  FINDINGS: NECK  No hypermetabolic lymph nodes in the neck.  CHEST  No hypermetabolic mediastinal or hilar nodes. No suspicious pulmonary nodules on the CT data. Coronary, aortic arch, and branch vessel atherosclerotic vascular disease.  ABDOMEN/PELVIS  No abnormal hypermetabolic activity within the liver, pancreas, adrenal glands, or spleen. Clustered right periaortic lymph nodes below the level of the renal veins, the primarily posterior to the IVC, measuring up to about 1.3 cm in short axis on image 130/4, maximum SUV 17.1. Abnormal hypermetabolic nodes extend into the right common iliac chain, index right iliac node 1.1 cm in short axis on image 143/4. These nodes are increased compared to 07/19/14.  Calcified left mesenteric mass measuring 3.3 by 2.2 cm, metabolic activity is similar to the surrounding small bowel, maximum SUV approximately 5.3  Along the surgical site in the right rectus abdominus there is some very faintly increased activity, maximum SUV  4.0, probably simply postoperative.  Faint curvilinear calcification in the left upper quadrant near the splenic flexure on image 122/4 is not hypermetabolic  and a least 6 mm in diameter.  There some small calcifications along the omentum.  Chronic presacral edema.  Uterus and ovaries absent.  SKELETON  No focal hypermetabolic activity to suggest skeletal metastasis.  IMPRESSION: 1. Hypermetabolic aortocaval and right common iliac adenopathy, maximum SUV 17.1, compatible with malignancy. 2. Calcified left mesenteric mass has only low-grade activity, maximum SUV 5.3, merit surveillance. 3. There several small calcifications along the omentum which are not currently metabolically active. 4. Along the scarring in operative site from prior laparotomy there some faintly accentuated metabolic activity which is probably related to the original wound and unlikely to be from tumor. 5. Coronary, aortic arch, and branch vessel atherosclerotic vascular disease.   Electronically Signed   By: Van Clines M.D.   On: 06/11/2016 13:05    NUCLEAR MEDICINE PET SKULL BASE TO THIGH 11/02/2016  IMPRESSION: 1. New enlarged and hypermetabolic left supraclavicular lymph node compatible with progressive metastatic adenopathy. 2. Persistent hypermetabolic retroperitoneal tumor along the distal aortocaval lymph node chain. Not significantly changed from previous exam. 3.  Aortic Atherosclerosis (ICD10-I70.0).  PATHOLOGY:     ASSESSMENT & PLAN:  Ms. Perlman is a pleasant 66 y.o. 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 She went on to receive adjuvant chemotherapy with Carbo/Taxol x 5 cycles requiring 50% dose reduction d/t peripheral neuropathy; 6th cycle held d/t cytopenias and intolerance; she completed chemotherapy on 12/06/13. She also received intravaginal brachytherapy, which completed on 10/17/13.  Post treatment PET scan in 04/2014 consistent with recurrent disease. She went on to complete external beam radiation to the  low periaortic area lymph node 28 fractions, and completing radiation on 07/15/14. She was noted to be in remission on PET imaging on 08/05/14.  Subsequent PET scan on 06/11/16, revealed hypermetabolic aortocaval and right common iliac adenopathy compatible with malignancy.  She began chemotherapy with Carbo/Gemzar on 07/2016. There have been several treatment delays d/t cytopenias.  From 09/08/16-09/12/16, she was hospitalized for seizure requiring intubation for airway protection. Carboplatin has since been discontinued from her treatment plan; we will attempt continued treatment with single-agent dose-reduced Gemzar. She is here today for consideration for next cycle of chemotherapy.     Endometrial/Ovarian cancer:  -Reviewed patient's PET scan with her and her husband today. She has progressive disease with new left supraclavicular LN.  -I will send her to IR for biopsy of this LN to see if we can send the tissue out for [facility] testing. If she is MSI high, she may qualify for [place]. -Patient's performance status has been declining. She looks very weak today. I will continue to hold her single agent gemzar until we get [employer] [facility] testing results back.  Pancytopenia secondary to chemotherapy:  -Platelets 62,000 today. Continue to hold chemo treatment.    Dispo:  -Return in 3 weeks to review left supraclavicular LN biopsy results and [employer] [facility] testing results to discuss the next plan of care in her treatment. Labs on next visit.  All questions were answered to patient's stated satisfaction. Encouraged patient to call with any new concerns or questions before her next visit to the cancer center and we can certain see her sooner, if needed.    Orders placed this encounter:  Orders Placed This Encounter  Procedures  . CT Biopsy  This document serves as a record of services personally performed by Twana First, MD. It was created on her behalf by Shirlean Mylar, a trained medical  scribe. The creation of this record is based on the scribe's personal observations and the provider's statements to them. This document has been checked and approved by the attending provider.  I have reviewed the above documentation for accuracy and completeness and I agree with the above.

## 2016-11-11 ENCOUNTER — Encounter (HOSPITAL_COMMUNITY): Payer: Medicare HMO | Admitting: Genetic Counselor

## 2016-11-11 ENCOUNTER — Other Ambulatory Visit (HOSPITAL_COMMUNITY): Payer: Medicare HMO

## 2016-11-16 ENCOUNTER — Ambulatory Visit (HOSPITAL_COMMUNITY): Payer: Medicare HMO

## 2016-11-17 ENCOUNTER — Other Ambulatory Visit: Payer: Self-pay | Admitting: Radiology

## 2016-11-19 ENCOUNTER — Ambulatory Visit (HOSPITAL_COMMUNITY)
Admission: RE | Admit: 2016-11-19 | Discharge: 2016-11-19 | Disposition: A | Discharge status: Home / Self Care | Payer: Medicare HMO | Source: Ambulatory Visit | Attending: Oncology | Admitting: Oncology

## 2016-11-19 ENCOUNTER — Encounter (HOSPITAL_COMMUNITY): Payer: Self-pay

## 2016-11-19 DIAGNOSIS — D6959 Other secondary thrombocytopenia: Secondary | ICD-10-CM | POA: Insufficient documentation

## 2016-11-19 DIAGNOSIS — C770 Secondary and unspecified malignant neoplasm of lymph nodes of head, face and neck: Secondary | ICD-10-CM | POA: Diagnosis not present

## 2016-11-19 DIAGNOSIS — E669 Obesity, unspecified: Secondary | ICD-10-CM | POA: Diagnosis not present

## 2016-11-19 DIAGNOSIS — C579 Malignant neoplasm of female genital organ, unspecified: Secondary | ICD-10-CM | POA: Diagnosis not present

## 2016-11-19 DIAGNOSIS — Z9221 Personal history of antineoplastic chemotherapy: Secondary | ICD-10-CM | POA: Insufficient documentation

## 2016-11-19 DIAGNOSIS — Z7984 Long term (current) use of oral hypoglycemic drugs: Secondary | ICD-10-CM | POA: Insufficient documentation

## 2016-11-19 DIAGNOSIS — Z923 Personal history of irradiation: Secondary | ICD-10-CM | POA: Insufficient documentation

## 2016-11-19 DIAGNOSIS — I700 Atherosclerosis of aorta: Secondary | ICD-10-CM | POA: Insufficient documentation

## 2016-11-19 DIAGNOSIS — Z7982 Long term (current) use of aspirin: Secondary | ICD-10-CM | POA: Insufficient documentation

## 2016-11-19 DIAGNOSIS — I10 Essential (primary) hypertension: Secondary | ICD-10-CM | POA: Diagnosis not present

## 2016-11-19 DIAGNOSIS — Z8542 Personal history of malignant neoplasm of other parts of uterus: Secondary | ICD-10-CM | POA: Insufficient documentation

## 2016-11-19 DIAGNOSIS — R590 Localized enlarged lymph nodes: Secondary | ICD-10-CM | POA: Diagnosis not present

## 2016-11-19 DIAGNOSIS — D701 Agranulocytosis secondary to cancer chemotherapy: Secondary | ICD-10-CM | POA: Diagnosis not present

## 2016-11-19 DIAGNOSIS — I739 Peripheral vascular disease, unspecified: Secondary | ICD-10-CM | POA: Diagnosis not present

## 2016-11-19 DIAGNOSIS — E1151 Type 2 diabetes mellitus with diabetic peripheral angiopathy without gangrene: Secondary | ICD-10-CM | POA: Diagnosis not present

## 2016-11-19 DIAGNOSIS — C569 Malignant neoplasm of unspecified ovary: Secondary | ICD-10-CM | POA: Diagnosis not present

## 2016-11-19 HISTORY — DX: Unspecified asthma, uncomplicated: J45.909

## 2016-11-19 HISTORY — DX: Unspecified convulsions: R56.9

## 2016-11-19 HISTORY — DX: Pneumonia, unspecified organism: J18.9

## 2016-11-19 LAB — CBC WITH DIFFERENTIAL/PLATELET
Basophils Absolute: 0 10*3/uL (ref 0.0–0.1)
Basophils Relative: 0 %
EOS ABS: 0.1 10*3/uL (ref 0.0–0.7)
Eosinophils Relative: 1 %
HCT: 32.2 % — ABNORMAL LOW (ref 36.0–46.0)
HEMOGLOBIN: 10.8 g/dL — AB (ref 12.0–15.0)
LYMPHS PCT: 23 %
Lymphs Abs: 1.2 10*3/uL (ref 0.7–4.0)
MCH: 33.4 pg (ref 26.0–34.0)
MCHC: 33.5 g/dL (ref 30.0–36.0)
MCV: 99.7 fL (ref 78.0–100.0)
MONOS PCT: 5 %
Monocytes Absolute: 0.3 10*3/uL (ref 0.1–1.0)
NEUTROS ABS: 3.7 10*3/uL (ref 1.7–7.7)
NEUTROS PCT: 71 %
Platelets: 76 10*3/uL — ABNORMAL LOW (ref 150–400)
RBC: 3.23 MIL/uL — ABNORMAL LOW (ref 3.87–5.11)
RDW: 15.7 % — ABNORMAL HIGH (ref 11.5–15.5)
WBC: 5.3 10*3/uL (ref 4.0–10.5)

## 2016-11-19 LAB — PROTIME-INR
INR: 0.98
PROTHROMBIN TIME: 13 s (ref 11.4–15.2)

## 2016-11-19 MED ORDER — HEPARIN SOD (PORK) LOCK FLUSH 100 UNIT/ML IV SOLN
500.0000 [IU] | Freq: Once | INTRAVENOUS | Status: AC
  Administered 2016-11-19: 500 [IU] via INTRAVENOUS

## 2016-11-19 MED ORDER — FENTANYL CITRATE (PF) 100 MCG/2ML IJ SOLN
INTRAMUSCULAR | Status: AC | PRN
  Administered 2016-11-19: 50 ug via INTRAVENOUS

## 2016-11-19 MED ORDER — MIDAZOLAM HCL 2 MG/2ML IJ SOLN
INTRAMUSCULAR | Status: AC | PRN
  Administered 2016-11-19: 1 mg via INTRAVENOUS
  Administered 2016-11-19: 0.5 mg via INTRAVENOUS

## 2016-11-19 MED ORDER — SODIUM CHLORIDE 0.9 % IV SOLN
INTRAVENOUS | Status: DC
  Administered 2016-11-19: 12:00:00 via INTRAVENOUS

## 2016-11-19 MED ORDER — HEPARIN SOD (PORK) LOCK FLUSH 100 UNIT/ML IV SOLN: 500.0000 [IU] | INTRAVENOUS | Status: DC

## 2016-11-19 MED ORDER — FENTANYL CITRATE (PF) 100 MCG/2ML IJ SOLN
INTRAMUSCULAR | Status: AC
  Filled 2016-11-19: qty 4

## 2016-11-19 MED ORDER — HEPARIN SOD (PORK) LOCK FLUSH 100 UNIT/ML IV SOLN
500.0000 [IU] | Freq: Once | INTRAVENOUS | Status: DC
  Filled 2016-11-19: qty 5

## 2016-11-19 MED ORDER — MIDAZOLAM HCL 2 MG/2ML IJ SOLN
INTRAMUSCULAR | Status: AC
  Filled 2016-11-19: qty 6

## 2016-11-19 MED ORDER — HEPARIN SOD (PORK) LOCK FLUSH 100 UNIT/ML IV SOLN: 500.0000 [IU] | INTRAVENOUS | Status: DC | PRN

## 2016-11-19 NOTE — Consult Note (Signed)
Chief Complaint: Patient was seen in consultation today for image guided left supraclavicular lymph node biopsy  Referring Physician(s): Zhou,Louise  Supervising Physician: Jacqulynn Cadet  Patient Status: [facility] - Out-pt  History of Present Illness: Kathleen Whitehead is a 66 y.o. female with history of recurrent/progressive uterine/ovarian carcinoma (diagnosed initially in 2014, status post chemoradiation and surgery) and recent follow-up PET scan which revealed a new enlarged and hypermetabolic left supraclavicular lymph node as well as persistent hypermetabolic retroperitoneal tumor along the distal aortocaval lymph node chain. She presents today for image guided left supraclavicular lymph node biopsy for further evaluation.  Past Medical History:  Diagnosis Date  . Anemia   . Chemotherapy induced neutropenia ([place])   . Chemotherapy induced thrombocytopenia   . Diabetes mellitus without complication ([place])   . Endometrial cancer ([place])   . Hot flashes   . Hypertension   . Malignant neoplasm ([place])   . Malignant neoplasm of ovary ([place]) 04/30/2013  . Obesity   . Ovarian cancer ([place])   . Pancytopenia ([place])   . Peripheral artery disease ([place])   . Peripheral neuropathy   . Port-a-cath in place     No past surgical history on file.  Allergies: Diphenhydramine; Iodinated diagnostic agents; Duloxetine hcl; Ibuprofen; and Penicillins  Medications: Prior to Admission medications   Medication Sig Start Date End Date Taking? Authorizing Provider  aspirin EC 325 MG tablet Take 325 mg by mouth daily.  09/15/15   [provider]  cetirizine (ZYRTEC) 10 MG tablet Take 10 mg by mouth daily.     [provider]  Cholecalciferol (VITAMIN D PO) Take 1 tablet by mouth daily.    [provider]  cyanocobalamin 1000 MCG tablet Take 1,000 mcg by mouth daily.    [provider]  glipiZIDE (GLUCOTROL) 10 MG tablet TAKE 1 TABLET BY MOUTH TWICE A DAY 01/23/16    [provider]  levETIRAcetam (KEPPRA) 500 MG tablet Take 1 tablet (500 mg total) by mouth 2 (two) times daily. 09/21/16   Baird Cancer, PA-C  lisinopril (PRINIVIL,ZESTRIL) 40 MG tablet Take 40 mg by mouth daily.     [provider]  loperamide (IMODIUM) 2 MG capsule Take 2 mg by mouth as needed for diarrhea or loose stools.     [provider]  magnesium oxide (MAG-OX) 400 (241.3 Mg) MG tablet Take 1 tablet (400 mg total) by mouth 2 (two) times daily. 10/05/16   Holley Bouche, NP  metFORMIN (GLUCOPHAGE) 1000 MG tablet Take 1,000 mg by mouth 2 (two) times daily with a meal.     [provider]  Omega-3 Fatty Acids (FISH OIL) 1000 MG CAPS Take 1,000 mg by mouth daily.     [provider]  ondansetron (ZOFRAN) 8 MG tablet Take 1 tablet (8 mg total) by mouth every 8 (eight) hours as needed for nausea or vomiting. 07/26/16   Penland, Kelby Fam, MD  ondansetron (ZOFRAN-ODT) 4 MG disintegrating tablet Take 4 mg by mouth every 8 (eight) hours as needed for nausea or vomiting.     [provider]  oxyCODONE-acetaminophen (PERCOCET) 10-325 MG tablet Take 1-2 tablets by mouth every 4 (four) hours as needed for pain. 11/01/16   Baird Cancer, PA-C  Polyethylene Glycol 3350 (MIRALAX PO) Take 17 g by mouth daily as needed for moderate constipation.     [provider]  prochlorperazine (COMPAZINE) 10 MG tablet Take 1 tablet (10 mg total) by mouth every 6 (six) hours  as needed for nausea or vomiting. 07/26/16   Penland, Kelby Fam, MD     Family History  Problem Relation Age of Onset  . Heart failure Mother   . Stroke Sister   . Cancer Sister   . Diabetes Sister   . Leukemia Sister   . Diabetes Brother     Social History   Social History  . Marital status: Married    Spouse name: N/A  . Number of children: N/A  . Years of education: N/A   Social History Main Topics  . Smoking status: Never Smoker  . Smokeless tobacco: Never  Used  . Alcohol use No  . Drug use: No  . Sexual activity: Not Currently   Other Topics Concern  . Not on file   Social History Narrative  . No narrative on file     Review of Systems denies fever, chest pain, dyspnea, cough, abdominal pain, back pain, nausea, vomiting or abnormal bleeding. She does have occasional headaches, right hip discomfort and bilateral foot neuropathy  Vital Signs: BP (!) 176/71 (BP Location: Right Arm)   Pulse 81   Temp 98.4 F (36.9 C) (Oral)   Resp 16   SpO2 98%   Physical Exam awake, alert. Chest clear to auscultation bilaterally. Clean, intact right chest wall Port-A-Cath, heart with regular rate and rhythm. Abdomen soft, obese, positive bowel sounds, nontender. Lower extremities with 1+ edema bilaterally.  Mallampati Score:     Imaging: Nm Pet Image Restag (ps) Skull Base To Thigh  Result Date: 11/02/2016 CLINICAL DATA:  Subsequent treatment strategy for endometrial and ovarian cancer. EXAM: NUCLEAR MEDICINE PET SKULL BASE TO THIGH TECHNIQUE: 12.2 mCi F-18 FDG was injected intravenously. Full-ring PET imaging was performed from the skull base to thigh after the radiotracer. CT data was obtained and used for attenuation correction and anatomic localization. FASTING BLOOD GLUCOSE:  Value: 139 mg/dl COMPARISON:  06/11/2016 FINDINGS: NECK No hypermetabolic lymph nodes in the neck. CHEST There is a new left supraclavicular lymph node which measures 1.2 cm and has an SUV max equal to 12.01. No hypermetabolic mediastinal or hilar lymph nodes. Aortic atherosclerosis noted. Calcifications within the LAD coronary artery noted. ABDOMEN/PELVIS No abnormal hypermetabolic activity within the liver, pancreas, adrenal glands, or spleen. Hypermetabolic retroperitoneal tumor within the aortocaval and right common iliac chain is identified. Index aortocaval tumor measures 1.8 cm thickness and has an SUV max equal to 10.26. Previously this measured 1.1 cm and had an SUV max  equal to 10.8. Calcified mesenteric mass is again identified. This measures 3.3 cm and has an SUV max equal to 4.25. Previously this measured the same. SKELETON No focal hypermetabolic activity to suggest skeletal metastasis. IMPRESSION: 1. New enlarged and hypermetabolic left supraclavicular lymph node compatible with progressive metastatic adenopathy. 2. Persistent hypermetabolic retroperitoneal tumor along the distal aortocaval lymph node chain. Not significantly changed from previous exam. 3.  Aortic Atherosclerosis (ICD10-I70.0). Electronically Signed   By: Kerby Moors M.D.   On: 11/02/2016 14:03    Labs:  CBC:  Recent Labs  10/05/16 1017 10/19/16 1052 10/26/16 1102 11/09/16 1303  WBC 3.9* 5.4 3.0* 4.4  HGB 9.7* 10.9* 10.0* 10.5*  HCT 29.6* 32.9* 29.5* 30.6*  PLT 54* 72* 32* 62*    COAGS:  Recent Labs  09/08/16 0239  INR 1.05  APTT 26    BMP:  Recent Labs  10/05/16 1017 10/19/16 1052 10/26/16 1102 11/09/16 1303  NA 135 133* 134* 137  K 4.3 4.2 4.0 3.9  CL 102 101 101 102  CO2 25 23 24 25   GLUCOSE 260* 281* 305* 205*  BUN 14 20 19 18   CALCIUM 9.3 9.1 8.9 9.5  CREATININE 1.05* 0.99 1.04* 0.91  GFRNONAA 54* 58* 54* >60  GFRAA >60 >60 >60 >60    LIVER FUNCTION TESTS:  Recent Labs  10/05/16 1017 10/19/16 1052 10/26/16 1102 11/09/16 1303  BILITOT 0.6 0.6 0.4 0.5  AST 28 32 35 27  ALT 15 17 25 16   ALKPHOS 71 66 66 68  PROT 6.5 6.8 6.4* 6.7  ALBUMIN 3.1* 3.1* 2.9* 3.1*    TUMOR MARKERS: No results for input(s): AFPTM, CEA, CA199, CHROMGRNA in the last 8760 hours.  Assessment and Plan: 66 y.o. female with history of recurrent/progressive uterine/ovarian carcinoma (diagnosed initially in 2014, status post chemoradiation and surgery) and recent follow-up PET scan which revealed a new enlarged and hypermetabolic left supraclavicular lymph node as well as persistent hypermetabolic retroperitoneal tumor along the distal aortocaval lymph node chain. She  presents today for image guided left supraclavicular lymph node biopsy for further evaluation.Risks and benefits discussed with the patient/spouse including, but not limited to bleeding, infection, damage to adjacent structures, inability to perform biopsy or low yield requiring additional tests.All of the patient's questions were answered, patient is agreeable to proceed.Consent signed and in chart.     Thank you for this interesting consult.  I greatly enjoyed meeting Kathleen Whitehead and look forward to participating in their care.  A copy of this report was sent to the requesting provider on this date.  Electronically Signed: D. Rowe Robert, PA-C 11/19/2016, 11:35 AM   I spent a total of 25 minutes in face to face in clinical consultation, greater than 50% of which was counseling/coordinating care for image guided left supraclavicular lymph node biopsy.

## 2016-11-19 NOTE — Discharge Instructions (Signed)
Needle Biopsy, Care After °These instructions give you information about caring for yourself after your procedure. Your doctor may also give you more specific instructions. Call your doctor if you have any problems or questions after your procedure. °Follow these instructions at home: °· Rest as told by your doctor. °· Take medicines only as told by your doctor. °· There are many different ways to close and cover the biopsy site, including stitches (sutures), skin glue, and adhesive strips. Follow instructions from your doctor about: °¨ How to take care of your biopsy site. °¨ When and how you should change your bandage (dressing). °¨ When you should remove your dressing. °¨ Removing whatever was used to close your biopsy site. °· Check your biopsy site every day for signs of infection. Watch for: °¨ Redness, swelling, or pain. °¨ Fluid, blood, or pus. °Contact a doctor if: °· You have a fever. °· You have redness, swelling, or pain at the biopsy site, and it lasts longer than a few days. °· You have fluid, blood, or pus coming from the biopsy site. °· You feel sick to your stomach (nauseous). °· You throw up (vomit). °Get help right away if: °· You are short of breath. °· You have trouble breathing. °· Your chest hurts. °· You feel dizzy or you pass out (faint). °· You have bleeding that does not stop with pressure or a bandage. °· You cough up blood. °· Your belly (abdomen) hurts. °This information is not intended to replace advice given to you by your health care provider. Make sure you discuss any questions you have with your health care provider. °Document Released: 06/03/2008 Document Revised: 11/27/2015 Document Reviewed: 06/17/2014 °Elsevier Interactive Patient Education © 2017 Elsevier Inc. ° ° °Moderate Conscious Sedation, Adult, Care After °These instructions provide you with information about caring for yourself after your procedure. Your health care provider may also give you more specific instructions.  Your treatment has been planned according to current medical practices, but problems sometimes occur. Call your health care provider if you have any problems or questions after your procedure. °What can I expect after the procedure? °After your procedure, it is common: °· To feel sleepy for several hours. °· To feel clumsy and have poor balance for several hours. °· To have poor judgment for several hours. °· To vomit if you eat too soon. °Follow these instructions at home: °For at least 24 hours after the procedure:  ° °· Do not: °¨ Participate in activities where you could fall or become injured. °¨ Drive. °¨ Use heavy machinery. °¨ Drink alcohol. °¨ Take sleeping pills or medicines that cause drowsiness. °¨ Make important decisions or sign legal documents. °¨ Take care of children on your own. °· Rest. °Eating and drinking  °· Follow the diet recommended by your health care provider. °· If you vomit: °¨ Drink water, juice, or soup when you can drink without vomiting. °¨ Make sure you have little or no nausea before eating solid foods. °General instructions  °· Have a responsible adult stay with you until you are awake and alert. °· Take over-the-counter and prescription medicines only as told by your health care provider. °· If you smoke, do not smoke without supervision. °· Keep all follow-up visits as told by your health care provider. This is important. °Contact a health care provider if: °· You keep feeling nauseous or you keep vomiting. °· You feel light-headed. °· You develop a rash. °· You have a fever. °Get help right   away if: °· You have trouble breathing. °This information is not intended to replace advice given to you by your health care provider. Make sure you discuss any questions you have with your health care provider. °Document Released: 04/11/2013 Document Revised: 11/24/2015 Document Reviewed: 10/11/2015 °Elsevier Interactive Patient Education © 2017 Elsevier Inc. ° °

## 2016-11-19 NOTE — Procedures (Signed)
Interventional Radiology Procedure Note  Procedure: US guided core biopsy of left supraclavicular LN.    Complications: None  Estimated Blood Loss: None  Recommendations: - Bedrest x 1 hr - DC home  Signed,  Criselda Peaches, MD

## 2016-11-24 ENCOUNTER — Telehealth (HOSPITAL_COMMUNITY): Payer: Self-pay | Admitting: *Deleted

## 2016-11-24 ENCOUNTER — Other Ambulatory Visit (HOSPITAL_COMMUNITY): Payer: Self-pay | Admitting: Oncology

## 2016-11-24 DIAGNOSIS — C569 Malignant neoplasm of unspecified ovary: Secondary | ICD-10-CM

## 2016-11-24 MED ORDER — OXYCODONE-ACETAMINOPHEN 10-325 MG PO TABS: 1.0000 | ORAL_TABLET | ORAL | 0 refills | Status: DC | PRN

## 2016-11-25 ENCOUNTER — Other Ambulatory Visit (HOSPITAL_COMMUNITY)
Admission: RE | Admit: 2016-11-25 | Discharge: 2016-11-25 | Disposition: A | Discharge status: Home / Self Care | Payer: Medicare HMO | Source: Ambulatory Visit | Attending: Oncology | Admitting: Oncology

## 2016-11-25 DIAGNOSIS — C569 Malignant neoplasm of unspecified ovary: Secondary | ICD-10-CM | POA: Insufficient documentation

## 2016-11-26 ENCOUNTER — Telehealth (HOSPITAL_COMMUNITY): Payer: Self-pay | Admitting: *Deleted

## 2016-11-26 ENCOUNTER — Telehealth (HOSPITAL_COMMUNITY): Payer: Self-pay

## 2016-11-26 NOTE — Telephone Encounter (Signed)
Incoming call:  Call [facility] [phone]) they have a question about her percocet insurance will only pay for 6 pills a day    Called and spoke with Butch Penny at [facility]. If patient has to have more than 6 pills a day she will need prior authorization.   Insurance also told [facility] that the prescription needed to be written for the 30 day amount that is needed; whether it is 180 or 240.   Called and spoke with patient to see how many pills she took a day of the percocet. She only takes 6 pills a day and states 180 pills is a 30 day supply for her.   Explained to patient what [facility] and her insurance had said. Told her I would relay message to providers about writing prescription for a 30 day supply and possible prior auth if over 6 pills a day.  She verbalized understanding.

## 2016-11-29 ENCOUNTER — Other Ambulatory Visit (HOSPITAL_COMMUNITY): Payer: Self-pay | Admitting: Oncology

## 2016-11-29 ENCOUNTER — Other Ambulatory Visit (HOSPITAL_COMMUNITY): Payer: Self-pay | Admitting: Hematology & Oncology

## 2016-11-29 DIAGNOSIS — G40901 Epilepsy, unspecified, not intractable, with status epilepticus: Secondary | ICD-10-CM

## 2016-12-02 ENCOUNTER — Encounter (HOSPITAL_COMMUNITY): Payer: Self-pay

## 2016-12-02 ENCOUNTER — Other Ambulatory Visit (HOSPITAL_COMMUNITY): Payer: Medicare HMO

## 2016-12-02 ENCOUNTER — Encounter (HOSPITAL_BASED_OUTPATIENT_CLINIC_OR_DEPARTMENT_OTHER): Payer: Medicare HMO | Admitting: Oncology

## 2016-12-02 VITALS — BP 130/58 | HR 87 | Temp 98.7°F | Resp 18 | Wt 239.9 lb

## 2016-12-02 DIAGNOSIS — C541 Malignant neoplasm of endometrium: Secondary | ICD-10-CM | POA: Diagnosis not present

## 2016-12-02 DIAGNOSIS — C569 Malignant neoplasm of unspecified ovary: Secondary | ICD-10-CM

## 2016-12-02 DIAGNOSIS — R5383 Other fatigue: Secondary | ICD-10-CM

## 2016-12-02 DIAGNOSIS — Z95828 Presence of other vascular implants and grafts: Secondary | ICD-10-CM

## 2016-12-02 DIAGNOSIS — C770 Secondary and unspecified malignant neoplasm of lymph nodes of head, face and neck: Secondary | ICD-10-CM | POA: Diagnosis not present

## 2016-12-02 DIAGNOSIS — D696 Thrombocytopenia, unspecified: Secondary | ICD-10-CM | POA: Diagnosis not present

## 2016-12-02 LAB — CBC WITH DIFFERENTIAL/PLATELET
Basophils Absolute: 0 10*3/uL (ref 0.0–0.1)
Basophils Relative: 0 %
EOS ABS: 0.1 10*3/uL (ref 0.0–0.7)
Eosinophils Relative: 1 %
HEMATOCRIT: 33.7 % — AB (ref 36.0–46.0)
HEMOGLOBIN: 11.2 g/dL — AB (ref 12.0–15.0)
LYMPHS ABS: 1.3 10*3/uL (ref 0.7–4.0)
Lymphocytes Relative: 25 %
MCH: 33.1 pg (ref 26.0–34.0)
MCHC: 33.2 g/dL (ref 30.0–36.0)
MCV: 99.7 fL (ref 78.0–100.0)
MONO ABS: 0.4 10*3/uL (ref 0.1–1.0)
MONOS PCT: 7 %
NEUTROS PCT: 67 %
Neutro Abs: 3.6 10*3/uL (ref 1.7–7.7)
Platelets: 74 10*3/uL — ABNORMAL LOW (ref 150–400)
RBC: 3.38 MIL/uL — ABNORMAL LOW (ref 3.87–5.11)
RDW: 14.1 % (ref 11.5–15.5)
WBC: 5.4 10*3/uL (ref 4.0–10.5)

## 2016-12-02 LAB — COMPREHENSIVE METABOLIC PANEL
ALK PHOS: 69 U/L (ref 38–126)
ALT: 19 U/L (ref 14–54)
ANION GAP: 10 (ref 5–15)
AST: 28 U/L (ref 15–41)
Albumin: 3.2 g/dL — ABNORMAL LOW (ref 3.5–5.0)
BILIRUBIN TOTAL: 0.4 mg/dL (ref 0.3–1.2)
BUN: 21 mg/dL — ABNORMAL HIGH (ref 6–20)
CALCIUM: 9.1 mg/dL (ref 8.9–10.3)
CO2: 25 mmol/L (ref 22–32)
Chloride: 99 mmol/L — ABNORMAL LOW (ref 101–111)
Creatinine, Ser: 1.08 mg/dL — ABNORMAL HIGH (ref 0.44–1.00)
GFR calc non Af Amer: 52 mL/min — ABNORMAL LOW (ref >60)
Glucose, Bld: 383 mg/dL — ABNORMAL HIGH (ref 65–99)
Potassium: 4.3 mmol/L (ref 3.5–5.1)
SODIUM: 134 mmol/L — AB (ref 135–145)
TOTAL PROTEIN: 6.8 g/dL (ref 6.5–8.1)

## 2016-12-02 MED ORDER — HEPARIN SOD (PORK) LOCK FLUSH 100 UNIT/ML IV SOLN
500.0000 [IU] | Freq: Once | INTRAVENOUS | Status: AC
  Administered 2016-12-02: 500 [IU] via INTRAVENOUS
  Filled 2016-12-02: qty 5

## 2016-12-02 MED ORDER — SODIUM CHLORIDE 0.9% FLUSH
10.0000 mL | INTRAVENOUS | Status: DC | PRN
  Administered 2016-12-02: 10 mL via INTRAVENOUS
  Filled 2016-12-02: qty 10

## 2016-12-02 NOTE — Patient Instructions (Addendum)
[place] at [facility] Discharge Instructions  RECOMMENDATIONS MADE BY THE CONSULTANT AND ANY TEST RESULTS WILL BE SENT TO YOUR REFERRING PHYSICIAN.  You were seen today by Dr. Twana First We will continue to hold treatment until foundation one testing comes back Follow up in 2 weeks   Thank you for choosing [place] at [facility] to provide your oncology and hematology care.  To afford each patient quality time with our provider, please arrive at least 15 minutes before your scheduled appointment time.    If you have a lab appointment with the [facility] please come in thru the  Main Entrance and check in at the main information desk  You need to re-schedule your appointment should you arrive 10 or more minutes late.  We strive to give you quality time with our providers, and arriving late affects you and other patients whose appointments are after yours.  Also, if you no show three or more times for appointments you may be dismissed from the clinic at the providers discretion.     Again, thank you for choosing [facility].  Our hope is that these requests will decrease the amount of time that you wait before being seen by our physicians.       _____________________________________________________________  Should you have questions after your visit to [facility], please contact our office at [phone] between the hours of 8:30 a.m. and 4:30 p.m.  Voicemails left after 4:30 p.m. will not be returned until the following business day.  For prescription refill requests, have your pharmacy contact our office.       Resources For Cancer Patients and their Caregivers ? American Cancer Society: Can assist with transportation, wigs, general needs, runs Look Good Feel Better.        (515)665-8820 ? Cancer Care: Provides financial assistance, online support groups, medication/co-pay assistance.   1-800-813-HOPE (289)436-1281) ? [place] Assists [place] cancer patients and their families through emotional , educational and financial support.  7864549100 ? [place] DSS Where to apply for food stamps, Medicaid and utility assistance. 458-115-6629 ? [facility]: Transportation to medical appointments. 780-317-7877 ? Social Security Administration: May apply for disability if have a Stage IV cancer. 515-493-1088 657 637 3796 ? [employer], Disability and Transit Services: Assists with nutrition, care and transit needs. [place] Support Programs: @10RELATIVEDAYS @ > Cancer Support Group  2nd Tuesday of the month 1pm-2pm, Journey Room  > Creative Journey  3rd Tuesday of the month 1130am-1pm, Journey Room  > Look Good Feel Better  1st Wednesday of the month 10am-12 noon, Journey Room (Call [place] to register [phone])

## 2016-12-02 NOTE — Progress Notes (Signed)
Kathleen Whitehead presented for Portacath access and flush.  Portacath located right chest wall accessed with  H 20 needle.  Good blood return present. Portacath flushed with 2ml NS and 500U/31ml Heparin and needle removed intact.  Procedure tolerated well and without incident.  Patient discharged from clinic with spouse as she came via wheelchair.

## 2016-12-02 NOTE — Progress Notes (Signed)
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 [name] and associates, pathology could not be certain that these were not 2 separate primaries rather than one metastatic process from the uterus to the ovary.      08/15/2013 - 12/06/2013 Chemotherapy    Carboplatin/Taxol with dose reduction of 50% and Taxol due to grade 2+ peripheral neuropathy and gabapentin induced nightmare/hallucinations. 5 out of 6 cycles given with the final cycle being cancelled secondary to significant thrombocytopenia/intolerance.      09/10/2013 - 10/17/2013 Radiation Therapy    5040 cGy delivered by EBRT followed by intravaginal brachytherapy.      04/30/2014 PET scan    PET scan consistent with recurrent disease of either ovarian cancer or endometrial cancer.      06/03/2014 - 07/15/2014 Radiation Therapy    5040 cGy over 28 fractions delivered to the low periaortic area lymph node either metastatic endometrial or metastatic or ovarian cancer.      07/19/2014 Imaging    Imaging concerning for new mesenteric disease and subdiaphragmatic disease.      08/05/2014 PET scan    No evidence of progression of disease.      08/05/2014 Remission    Negative PET scan.      06/11/2016 PET scan    1. Hypermetabolic aortocaval and right common iliac adenopathy, maximum SUV 17.1, compatible with malignancy. 2. Calcified left mesenteric mass has only low-grade activity, maximum SUV 5.3, merit surveillance. 3. There several small calcifications along the omentum which are not currently metabolically active. 4.  Along the scarring in operative site from prior laparotomy there some faintly accentuated metabolic activity which is probably related to the original wound and unlikely to be from tumor. 5. Coronary, aortic arch, and branch vessel atherosclerotic vascular disease.      06/11/2016 Relapse/Recurrence    PET scan with aortocaval & right iliac adenopathy consistent with malignancy.       08/03/2016 -  Chemotherapy    Carboplatin Day 1/Gemzar Days 1&8 every 21 days       08/10/2016 Treatment Plan Change    Day 8 cycle 1 cancelled due to cytopenia      09/07/2016 Treatment Plan Change    Treatment delayed x 1 week due to thrombocytopenia.  Hypomagnesemia noted and replaced IV.      09/08/2016 - 09/12/2016 Hospital Admission    Admit date: 09/08/2016  Admission diagnosis: Seizure Additional comments: Intubated on 3/7 for airway protection and extubated on 09/09/2016.  Hospital course complicated by pancytopenia secondary to systemic chemotherapy.      09/08/2016 Imaging    EEG- This sedated EEG is abnormal due to diffuse slowing of the waking background.  Clinical Correlation of the above findings indicates diffuse cerebral dysfunction that is non-specific in etiology and can be seen with hypoxic/ischemic injury, toxic/metabolic encephalopathies, neurodegenerative disorders, or medication effect.  The absence of epileptiform discharges does not rule out a clinical diagnosis of epilepsy.  Clinical correlation is advised.      09/12/2016 Imaging    CT head- No metastatic disease or acute intracranial abnormality. Normal  for age CT appearance of the brain.      09/21/2016 Treatment Plan Change    Carboplatin deleted from treatment plan.  Will pursue single-agent Gemcitabine at reduced dose.      11/19/2016 Relapse/Recurrence    Diagnosis Lymph node, needle/core biopsy, left supraclavicular METASTATIC ADENOCARCINOMA, CONSISTENT WITH GYNECOLOGICAL PRIMARY.         INTERVAL HISTORY:  Kathleen Whitehead presents today for follow up. Since her last visit she had her left supraclavicular lymph node biopsy performed which demonstrated metastatic adenocarcinoma consistent with a gynecological primary. Patient states that she continues to feel chronically fatigued. She states that her appetite is poor, she states that she thought she has been eating better but she has again lost 3 pounds since her last visit. Her husband states that she does eat 1 good meal a day.   REVIEW OF SYSTEMS:  Review of Systems  Constitutional: Positive for fatigue and unexpected weight change.  HENT:  Negative.  Negative for mouth sores and nosebleeds.   Eyes: Negative.   Respiratory: Negative.  Negative for shortness of breath.   Cardiovascular: Negative for palpitations.  Gastrointestinal: Negative for abdominal pain and blood in stool.          Endocrine: Negative.   Genitourinary: Negative.  Negative for difficulty urinating and hematuria.   Musculoskeletal: Negative for myalgias and neck pain.  Skin: Negative.   Neurological: Positive for numbness ( neuropathy in hands, legs and feet. (chronic and largely unchanged)).  Hematological: Does not bruise/bleed easily.  Psychiatric/Behavioral: Negative.   All other systems reviewed and are negative.    PAST MEDICAL/SURGICAL HISTORY:  Past Medical History:  Diagnosis Date  . Anemia   . Asthma   . Chemotherapy induced neutropenia ([place])   . Chemotherapy induced thrombocytopenia   . Diabetes mellitus without complication ([place])   . Endometrial cancer ([place])   . Hot flashes   . Hypertension   . Malignant neoplasm ([place])   . 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 Obesity   . Ovarian cancer ([place])   . Pancytopenia ([place])   . Peripheral artery disease ([place])   . Peripheral neuropathy   . Pneumonia 2015  . Port-a-cath in place   . Seizures ([place]) 09/08/2016   ?? due to low magnesium   Past Surgical History:  Procedure Laterality Date  .  ABDOMINAL HYSTERECTOMY  2014     SOCIAL HISTORY:  Social History   Social History  . Marital status: Married    Spouse name: N/A  . Number of children: N/A  . Years of education: N/A   Occupational History  . Not on file.   Social History Main Topics  . Smoking status: Never Smoker  . Smokeless tobacco: Never Used  . Alcohol use No  . Drug use: No  . Sexual activity: Not Currently   Other Topics Concern  . Not on file   Social History Narrative  . No narrative on file    FAMILY HISTORY:  Family History  Problem Relation Age of Onset  . Heart failure Mother   . Stroke Sister   . Cancer Sister   . Diabetes Sister   . Leukemia Sister   . Diabetes Brother     CURRENT MEDICATIONS:  Outpatient Encounter Prescriptions as of 10/19/2016  Medication Sig Note  . aspirin EC 325 MG tablet Take 325 mg by mouth daily.    . cetirizine (ZYRTEC) 10 MG tablet Take 10 mg by mouth daily.    Marland Kitchen  Cholecalciferol (VITAMIN D PO) Take 1 tablet by mouth daily.   Marland Kitchen glipiZIDE (GLUCOTROL) 10 MG tablet TAKE 1 TABLET BY MOUTH TWICE A DAY   . levETIRAcetam (KEPPRA) 500 MG tablet Take 1 tablet (500 mg total) by mouth 2 (two) times daily.   Marland Kitchen lisinopril (PRINIVIL,ZESTRIL) 40 MG tablet Take 40 mg by mouth daily.    Marland Kitchen loperamide (IMODIUM) 2 MG capsule Take 2 mg by mouth as needed for diarrhea or loose stools.    . magnesium oxide (MAG-OX) 400 (241.3 Mg) MG tablet Take 1 tablet (400 mg total) by mouth 2 (two) times daily.   . metFORMIN (GLUCOPHAGE) 1000 MG tablet Take 1,000 mg by mouth 2 (two) times daily with a meal.    . Omega-3 Fatty Acids (FISH OIL) 1000 MG CAPS Take 1,000 mg by mouth daily.    . ondansetron (ZOFRAN) 8 MG tablet Take 1 tablet (8 mg total) by mouth every 8 (eight) hours as needed for nausea or vomiting. 09/08/2016: Spouse states pt takes both  . ondansetron (ZOFRAN-ODT) 4 MG disintegrating tablet Take 4 mg by mouth every 8 (eight) hours as needed for nausea or vomiting.  09/08/2016:  Spouse states pt takes both  . oxyCODONE-acetaminophen (PERCOCET) 10-325 MG tablet Take 1-2 tablets by mouth every 4 (four) hours as needed for pain.   . Polyethylene Glycol 3350 (MIRALAX PO) Take 17 g by mouth daily as needed for moderate constipation.    . prochlorperazine (COMPAZINE) 10 MG tablet Take 1 tablet (10 mg total) by mouth every 6 (six) hours as needed for nausea or vomiting.   . [DISCONTINUED] oxyCODONE-acetaminophen (PERCOCET) 10-325 MG tablet Take 1 tablet by mouth every 4 (four) hours as needed for pain.   . [DISCONTINUED] oxyCODONE-acetaminophen (PERCOCET) 10-325 MG tablet Take 1 tablet by mouth every 4 (four) hours as needed for pain.   . [DISCONTINUED] fentaNYL (DURAGESIC - DOSED MCG/HR) 25 MCG/HR patch Place 25 mcg onto the skin every 3 (three) days.   . [DISCONTINUED] lidocaine-prilocaine (EMLA) cream Apply a quarter size amount to affected area 1 hour prior to coming to chemotherapy. (Patient not taking: Reported on 10/05/2016)    No facility-administered encounter medications on file as of 10/19/2016.      ALLERGIES:  Allergies  Allergen Reactions  . Diphenhydramine Palpitations  . Iodinated Diagnostic Agents Other (See Comments)    Unknown  . Duloxetine Hcl Nausea And Vomiting  . Ibuprofen Other (See Comments)    Makes my bones hurt  . Penicillins Rash       PHYSICAL EXAM:  ECOG Performance status: 2-3 - Symptomatic; requires assistance with ADLs     Physical Exam  Constitutional: She is oriented to person, place, and time and well-developed, well-nourished, and in no distress.  Seen in wheelchair  HENT:  Head: Normocephalic and atraumatic.  Mouth/Throat: Oropharynx is clear and moist. No oropharyngeal exudate.  Eyes: Conjunctivae and EOM are normal. Pupils are equal, round, and reactive to light. No scleral icterus.  Neck: Normal range of motion. Neck supple.  Cardiovascular: Normal rate, regular rhythm and normal heart sounds.   Pulmonary/Chest:  Effort normal and breath sounds normal. No respiratory distress. She has no wheezes. She has no rales.  Abdominal: Soft. Bowel sounds are normal. There is no tenderness. There is no guarding.  Musculoskeletal: Normal range of motion.  Lymphadenopathy:    She has no cervical adenopathy.  Neurological: She is alert and oriented to person, place, and time. Gait normal.  Skin: Skin is warm  and dry. No rash noted. No pallor.  Psychiatric: Mood, memory, affect and judgment normal.  Nursing note and vitals reviewed.    LABORATORY DATA:  I have reviewed the labs as listed.  CBC    Component Value Date/Time   WBC 5.3 11/19/2016 1120   RBC 3.23 (L) 11/19/2016 1120   HGB 10.8 (L) 11/19/2016 1120   HCT 32.2 (L) 11/19/2016 1120   PLT 76 (L) 11/19/2016 1120   MCV 99.7 11/19/2016 1120   MCH 33.4 11/19/2016 1120   MCHC 33.5 11/19/2016 1120   RDW 15.7 (H) 11/19/2016 1120   LYMPHSABS 1.2 11/19/2016 1120   MONOABS 0.3 11/19/2016 1120   EOSABS 0.1 11/19/2016 1120   BASOSABS 0.0 11/19/2016 1120   CMP Latest Ref Rng & Units 11/09/2016 10/26/2016 10/19/2016  Glucose 65 - 99 mg/dL 205(H) 305(H) 281(H)  BUN 6 - 20 mg/dL '18 19 20  '$ Creatinine 0.44 - 1.00 mg/dL 0.91 1.04(H) 0.99  Sodium 135 - 145 mmol/L 137 134(L) 133(L)  Potassium 3.5 - 5.1 mmol/L 3.9 4.0 4.2  Chloride 101 - 111 mmol/L 102 101 101  CO2 22 - 32 mmol/L '25 24 23  '$ Calcium 8.9 - 10.3 mg/dL 9.5 8.9 9.1  Total Protein 6.5 - 8.1 g/dL 6.7 6.4(L) 6.8  Total Bilirubin 0.3 - 1.2 mg/dL 0.5 0.4 0.6  Alkaline Phos 38 - 126 U/L 68 66 66  AST 15 - 41 U/L 27 35 32  ALT 14 - 54 U/L '16 25 17   '$ Results for Kathleen Whitehead, Kathleen Whitehead (MRN [id number])   Ref. Range 10/19/2016 10:52  Magnesium Latest Ref Range: 1.7 - 2.4 mg/dL 1.6 (L)   PENDING LABS:    DIAGNOSTIC IMAGING:  I have personally reviewed the radiologic reports and agree with below results.   PET scan: 06/11/16 CLINICAL DATA:  Subsequent Treatment strategy for uterine and ovarian cancer with  increasing tumor markers appear.  EXAM: NUCLEAR MEDICINE PET SKULL BASE TO THIGH  TECHNIQUE: 12.3 mCi F-18 FDG was injected intravenously. Full-ring PET imaging was performed from the skull base to thigh after the radiotracer. CT data was obtained and used for attenuation correction and anatomic localization.  FASTING BLOOD GLUCOSE:  Value: 86 mg/dl  COMPARISON:  Multiple exams, including CT abdomen and pelvis dated 07/19/2014  FINDINGS: NECK  No hypermetabolic lymph nodes in the neck.  CHEST  No hypermetabolic mediastinal or hilar nodes. No suspicious pulmonary nodules on the CT data. Coronary, aortic arch, and branch vessel atherosclerotic vascular disease.  ABDOMEN/PELVIS  No abnormal hypermetabolic activity within the liver, pancreas, adrenal glands, or spleen. Clustered right periaortic lymph nodes below the level of the renal veins, the primarily posterior to the IVC, measuring up to about 1.3 cm in short axis on image 130/4, maximum SUV 17.1. Abnormal hypermetabolic nodes extend into the right common iliac chain, index right iliac node 1.1 cm in short axis on image 143/4. These nodes are increased compared to 07/19/14.  Calcified left mesenteric mass measuring 3.3 by 2.2 cm, metabolic activity is similar to the surrounding small bowel, maximum SUV approximately 5.3  Along the surgical site in the right rectus abdominus there is some very faintly increased activity, maximum SUV 4.0, probably simply postoperative.  Faint curvilinear calcification in the left upper quadrant near the splenic flexure on image 122/4 is not hypermetabolic and a least 6 mm in diameter.  There some small calcifications along the omentum.  Chronic presacral edema.  Uterus and ovaries absent.  SKELETON  No focal hypermetabolic activity to  suggest skeletal metastasis.  IMPRESSION: 1. Hypermetabolic aortocaval and right common iliac adenopathy, maximum SUV 17.1,  compatible with malignancy. 2. Calcified left mesenteric mass has only low-grade activity, maximum SUV 5.3, merit surveillance. 3. There several small calcifications along the omentum which are not currently metabolically active. 4. Along the scarring in operative site from prior laparotomy there some faintly accentuated metabolic activity which is probably related to the original wound and unlikely to be from tumor. 5. Coronary, aortic arch, and branch vessel atherosclerotic vascular disease.   Electronically Signed   By: Van Clines M.D.   On: 06/11/2016 13:05    NUCLEAR MEDICINE PET SKULL BASE TO THIGH 11/02/2016  IMPRESSION: 1. New enlarged and hypermetabolic left supraclavicular lymph node compatible with progressive metastatic adenopathy. 2. Persistent hypermetabolic retroperitoneal tumor along the distal aortocaval lymph node chain. Not significantly changed from previous exam. 3.  Aortic Atherosclerosis (ICD10-I70.0).  PATHOLOGY:     ASSESSMENT & PLAN:  Ms. Moat is a pleasant 66 y.o. 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 She went on to receive adjuvant chemotherapy with Carbo/Taxol x 5 cycles requiring 50% dose reduction d/t peripheral neuropathy; 6th cycle held d/t cytopenias and intolerance; she completed chemotherapy on 12/06/13. She also received intravaginal brachytherapy, which completed on 10/17/13.  Post treatment PET scan in 04/2014 consistent with recurrent disease. She went on to complete external beam radiation to the low periaortic area lymph node 28 fractions, and completing radiation on 07/15/14. She was noted to be in remission on PET imaging on 08/05/14.  Subsequent PET scan on 06/11/16, revealed hypermetabolic aortocaval and right common iliac adenopathy compatible with malignancy.  She began chemotherapy with Carbo/Gemzar on 07/2016.  There have been several treatment delays d/t cytopenias.  From 09/08/16-09/12/16, she was hospitalized for seizure requiring intubation for airway protection. Carboplatin has since been discontinued from her treatment plan; we will attempt continued treatment with single-agent dose-reduced Gemzar.  She last received treatment on 10/19/16 with cycle 3 with single agent gemzar. Repeat PET demonstrated progressive disease with new left supraclavicular LN. The LN was biopsied and confirmed metastatic disease.    Endometrial/Ovarian cancer:  -I have reviewed with the patient her left supraclavicular LN biopsy results confirming metastatic disease. [facility] One testing to be done on this. If she is MSI high, she may qualify for [place]. If not I may consider either single agent avastin vs. Liposomal doxorubicin with avastin.     Dispo:  -Return in 2 weeks to review Foundation One testing results to discuss the next plan of care in her treatment. Labs on next visit.  All questions were answered to patient's stated satisfaction. Encouraged patient to call with any new concerns or questions before her next visit to the cancer center and we can certain see her sooner, if needed.    Orders placed this encounter:  Orders Placed This Encounter  Procedures  . Schedule Portacath Flush Appointment   This document serves as a record of services personally performed by Twana First, MD. It was created on her behalf by Shirlean Mylar, a trained medical scribe. The creation of this record is based on the scribe's personal observations and the provider's statements to them. This document has been checked and approved by the attending provider.  I have reviewed the above documentation for accuracy and completeness and I agree with the above.

## 2016-12-03 LAB — CA 125: CA 125: 82.1 U/mL — ABNORMAL HIGH (ref 0.0–38.1)

## 2016-12-06 ENCOUNTER — Telehealth (HOSPITAL_COMMUNITY): Payer: Self-pay | Admitting: Emergency Medicine

## 2016-12-06 NOTE — Telephone Encounter (Signed)
Spoke with Maudie Mercury at [facility] pathology and ordered foundation one on [phone] per Dr Laverle Patter request today.

## 2016-12-15 ENCOUNTER — Encounter (HOSPITAL_COMMUNITY): Payer: Self-pay

## 2016-12-17 ENCOUNTER — Encounter (HOSPITAL_COMMUNITY): Payer: Medicare HMO | Attending: Hematology | Admitting: Hematology

## 2016-12-17 ENCOUNTER — Encounter (HOSPITAL_COMMUNITY): Payer: Self-pay | Admitting: Hematology

## 2016-12-17 VITALS — BP 148/62 | HR 94 | Temp 98.6°F | Resp 16 | Ht 69.0 in | Wt 239.0 lb

## 2016-12-17 DIAGNOSIS — D61818 Other pancytopenia: Secondary | ICD-10-CM

## 2016-12-17 DIAGNOSIS — C569 Malignant neoplasm of unspecified ovary: Secondary | ICD-10-CM

## 2016-12-17 DIAGNOSIS — D696 Thrombocytopenia, unspecified: Secondary | ICD-10-CM | POA: Diagnosis not present

## 2016-12-17 DIAGNOSIS — Z95828 Presence of other vascular implants and grafts: Secondary | ICD-10-CM | POA: Insufficient documentation

## 2016-12-17 DIAGNOSIS — C541 Malignant neoplasm of endometrium: Secondary | ICD-10-CM | POA: Insufficient documentation

## 2016-12-17 NOTE — Patient Instructions (Addendum)
[place] at [facility] Discharge Instructions  RECOMMENDATIONS MADE BY THE CONSULTANT AND ANY TEST RESULTS WILL BE SENT TO YOUR REFERRING PHYSICIAN.  You were seen today by Dr. Irene Limbo. CT scan in 1  Month. Return in 1 months for labs and follow up.    Thank you for choosing [place] at [facility] to provide your oncology and hematology care.  To afford each patient quality time with our provider, please arrive at least 15 minutes before your scheduled appointment time.    If you have a lab appointment with the [facility] please come in thru the  Main Entrance and check in at the main information desk  You need to re-schedule your appointment should you arrive 10 or more minutes late.  We strive to give you quality time with our providers, and arriving late affects you and other patients whose appointments are after yours.  Also, if you no show three or more times for appointments you may be dismissed from the clinic at the providers discretion.     Again, thank you for choosing [facility].  Our hope is that these requests will decrease the amount of time that you wait before being seen by our physicians.       _____________________________________________________________  Should you have questions after your visit to [facility], please contact our office at [phone] between the hours of 8:30 a.m. and 4:30 p.m.  Voicemails left after 4:30 p.m. will not be returned until the following business day.  For prescription refill requests, have your pharmacy contact our office.       Resources For Cancer Patients and their Caregivers ? American Cancer Society: Can assist with transportation, wigs, general needs, runs Look Good Feel Better.        (803) 541-3894 ? Cancer Care: Provides financial assistance, online support groups, medication/co-pay assistance.  1-800-813-HOPE 909-022-7196) ? [place] Assists [place] cancer patients and their families through emotional , educational and financial support.  442-818-0674 ? [place] DSS Where to apply for food stamps, Medicaid and utility assistance. (548)405-0551 ? [facility]: Transportation to medical appointments. 847-272-8569 ? Social Security Administration: May apply for disability if have a Stage IV cancer. 9304966431 (417) 386-5876 ? [employer], Disability and Transit Services: Assists with nutrition, care and transit needs. [place] Support Programs: @10RELATIVEDAYS @ > Cancer Support Group  2nd Tuesday of the month 1pm-2pm, Journey Room  > Creative Journey  3rd Tuesday of the month 1130am-1pm, Journey Room  > Look Good Feel Better  1st Wednesday of the month 10am-12 noon, Journey Room (Call [place] to register [phone])

## 2016-12-21 ENCOUNTER — Other Ambulatory Visit (HOSPITAL_COMMUNITY)
Admission: RE | Admit: 2016-12-21 | Discharge: 2016-12-21 | Disposition: A | Discharge status: Home / Self Care | Payer: Medicare HMO | Source: Ambulatory Visit | Attending: Hematology | Admitting: Hematology

## 2016-12-21 DIAGNOSIS — C799 Secondary malignant neoplasm of unspecified site: Secondary | ICD-10-CM | POA: Insufficient documentation

## 2016-12-28 ENCOUNTER — Other Ambulatory Visit (HOSPITAL_COMMUNITY): Payer: Self-pay | Admitting: *Deleted

## 2016-12-28 ENCOUNTER — Other Ambulatory Visit (HOSPITAL_COMMUNITY): Payer: Self-pay | Admitting: Adult Health

## 2016-12-28 ENCOUNTER — Encounter (HOSPITAL_COMMUNITY): Payer: Self-pay | Admitting: Adult Health

## 2016-12-28 DIAGNOSIS — C569 Malignant neoplasm of unspecified ovary: Secondary | ICD-10-CM

## 2016-12-28 MED ORDER — OXYCODONE-ACETAMINOPHEN 10-325 MG PO TABS: 1.0000 | ORAL_TABLET | ORAL | 0 refills | Status: DC | PRN

## 2016-12-28 NOTE — Progress Notes (Signed)
Patient;s husband came by cancer center to pick up refill for Percocet.   [place] Controlled Substance Reporting System reviewed and refill is appropriate on or after 12/29/16. Paper prescription printed & post-dated; Rx given to patient's husband by Kerrie Buffalo, NT.    NCCSRS reviewed:     Mike Craze, NP [place] [phone]

## 2016-12-28 NOTE — Progress Notes (Signed)
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 [name] and associates, pathology could not be certain that these were not 2 separate primaries rather than one metastatic process from the uterus to the ovary.      08/15/2013 - 12/06/2013 Chemotherapy    Carboplatin/Taxol with dose reduction of 50% and Taxol due to grade 2+ peripheral neuropathy and gabapentin induced nightmare/hallucinations. 5 out of 6 cycles given with the final cycle being cancelled secondary to significant thrombocytopenia/intolerance.      09/10/2013 - 10/17/2013 Radiation Therapy    5040 cGy delivered by EBRT followed by intravaginal brachytherapy.      04/30/2014 PET scan    PET scan consistent with recurrent disease of either ovarian cancer or endometrial cancer.      06/03/2014 - 07/15/2014 Radiation Therapy    5040 cGy over 28 fractions delivered to the low periaortic area lymph node either metastatic endometrial or metastatic or ovarian cancer.      07/19/2014 Imaging    Imaging concerning for new mesenteric disease and subdiaphragmatic disease.      08/05/2014 PET scan    No evidence of progression of disease.      08/05/2014 Remission    Negative PET scan.      06/11/2016 PET scan    1. Hypermetabolic aortocaval and right common iliac adenopathy, maximum SUV 17.1, compatible with malignancy. 2. Calcified left mesenteric mass has only low-grade activity, maximum SUV 5.3, merit surveillance. 3. There several small calcifications along the omentum which are not currently metabolically active. 4. Along the scarring in operative site  from prior laparotomy there some faintly accentuated metabolic activity which is probably related to the original wound and unlikely to be from tumor. 5. Coronary, aortic arch, and branch vessel atherosclerotic vascular disease.      06/11/2016 Relapse/Recurrence    PET scan with aortocaval & right iliac adenopathy consistent with malignancy.       08/03/2016 -  Chemotherapy    Carboplatin Day 1/Gemzar Days 1&8 every 21 days       08/10/2016 Treatment Plan Change    Day 8 cycle 1 cancelled due to cytopenia      09/07/2016 Treatment Plan Change    Treatment delayed x 1 week due to thrombocytopenia.  Hypomagnesemia noted and replaced IV.      09/08/2016 - 09/12/2016 Hospital Admission    Admit date: 09/08/2016  Admission diagnosis: Seizure Additional comments: Intubated on 3/7 for airway protection and extubated on 09/09/2016.  Hospital course complicated by pancytopenia secondary to systemic chemotherapy.      09/08/2016 Imaging    EEG- This sedated EEG is abnormal due to diffuse slowing of the waking background.  Clinical Correlation of the above findings indicates diffuse cerebral dysfunction that is non-specific in etiology and can be seen with hypoxic/ischemic injury, toxic/metabolic encephalopathies, neurodegenerative disorders, or medication effect.  The absence of epileptiform discharges does not rule out a clinical diagnosis of epilepsy.  Clinical correlation is advised.      09/12/2016 Imaging    CT head- No metastatic disease or acute intracranial abnormality. Normal for age CT appearance of the brain.  09/21/2016 Treatment Plan Change    Carboplatin deleted from treatment plan.  Will pursue single-agent Gemcitabine at reduced dose.      11/19/2016 Relapse/Recurrence    Diagnosis Lymph node, needle/core biopsy, left supraclavicular METASTATIC ADENOCARCINOMA, CONSISTENT WITH GYNECOLOGICAL PRIMARY.       INTERVAL HISTORY:  Patient Is here for follow-up of her  metastatic GYN malignancy which was noted to be progressive based on a biopsy of her left supraclavicular lymph node that showed metastatic adenocarcinoma consistent with a GYN primary. She had progressed on single agent gemcitabine. Has had issues with cytopenias and poor functional status. Her current ECOG performance status is a 3. Notes poor appetite and significant fatigue and appears to have a limited bone marrow reserve. She had Foundation 1 testing on the lymph node which shows MSI stable status. PDL 1 testing was not sent and this has been requested. We discussed that all treatments would be palliative and given her poor performance status and lack of overt he symptomatically disease at this time it would be reasonable to take some additional time prior to additional treatments and focus on nutrition and physical activity. We will have her come back after PDL 1 results in about a month to determine if she would be a candidate for PD1 inhibitor treatment. She is also noted to have uncontrolled diabetes which needs to be optimized with blood sugars in the 300s. Still with significant peripheral neuropathy.  REVIEW OF SYSTEMS:    10 Point review of systems of done and is negative except as noted above.  . Past Medical History:  Diagnosis Date  . Anemia   . Asthma   . Chemotherapy induced neutropenia (HCC)   . Chemotherapy induced thrombocytopenia   . Diabetes mellitus without complication (HCC)   . Endometrial cancer (HCC)   . Hot flashes   . Hypertension   . Malignant neoplasm (HCC)   . Malignant neoplasm of ovary (HCC) 04/30/2013  . Obesity   . Ovarian cancer (HCC)   . Pancytopenia (HCC)   . Peripheral artery disease (HCC)   . Peripheral neuropathy   . Pneumonia 2015  . Port-a-cath in place   . Seizures (HCC) 09/08/2016   ?? due to low magnesium    . Past Surgical History:  Procedure Laterality Date  . ABDOMINAL HYSTERECTOMY  2014    . Social History  Substance Use  Topics  . Smoking status: Never Smoker  . Smokeless tobacco: Never Used  . Alcohol use No    ALLERGIES:  is allergic to diphenhydramine; iodinated diagnostic agents; duloxetine hcl; ibuprofen; and penicillins.  MEDICATIONS:  Current Outpatient Prescriptions  Medication Sig Dispense Refill  . acetaminophen (TYLENOL) 500 MG tablet Take 500 mg by mouth every 6 (six) hours as needed.    Marland Kitchen aspirin EC 325 MG tablet Take 325 mg by mouth daily.     . cetirizine (ZYRTEC) 10 MG tablet Take 10 mg by mouth daily.     . Cholecalciferol (VITAMIN D PO) Take 1 tablet by mouth daily.    . cyanocobalamin 1000 MCG tablet Take 1,000 mcg by mouth daily.    Marland Kitchen glipiZIDE (GLUCOTROL) 10 MG tablet TAKE 1 TABLET BY MOUTH TWICE A DAY  3  . levETIRAcetam (KEPPRA) 500 MG tablet TAKE 1 TABLET (500 MG TOTAL) BY MOUTH 2 (TWO) TIMES DAILY. 60 tablet 1  . lisinopril (PRINIVIL,ZESTRIL) 40 MG tablet Take 40 mg by mouth daily.     Marland Kitchen loperamide (IMODIUM) 2 MG capsule Take 2 mg  by mouth as needed for diarrhea or loose stools.     . magnesium oxide (MAG-OX) 400 (241.3 Mg) MG tablet Take 1 tablet (400 mg total) by mouth 2 (two) times daily. 60 tablet 2  . metFORMIN (GLUCOPHAGE) 1000 MG tablet Take 1,000 mg by mouth 2 (two) times daily with a meal.     . Omega-3 Fatty Acids (FISH OIL) 1000 MG CAPS Take 1,000 mg by mouth daily.     . ondansetron (ZOFRAN) 8 MG tablet Take 1 tablet (8 mg total) by mouth every 8 (eight) hours as needed for nausea or vomiting. 30 tablet 2  . ondansetron (ZOFRAN-ODT) 4 MG disintegrating tablet Take 4 mg by mouth every 8 (eight) hours as needed for nausea or vomiting.     . Polyethylene Glycol 3350 (MIRALAX PO) Take 17 g by mouth daily as needed for moderate constipation.     . prochlorperazine (COMPAZINE) 10 MG tablet TAKE 1 TABLET (10 MG TOTAL) BY MOUTH EVERY 6 (SIX) HOURS AS NEEDED FOR NAUSEA OR VOMITING. 30 tablet 2  . oxyCODONE-acetaminophen (PERCOCET) 10-325 MG tablet Take 1-2 tablets by mouth every  4 (four) hours as needed for pain. 180 tablet 0   No current facility-administered medications for this visit.     PHYSICAL EXAMINATION: ECOG PERFORMANCE STATUS: 3 - Symptomatic, >50% confined to bed  . Vitals:   12/17/16 1409  BP: (!) 148/62  Pulse: 94  Resp: 16  Temp: 98.6 F (37 C)    Filed Weights   12/17/16 1409  Weight: 239 lb (108.4 kg)   .Body mass index is 35.29 kg/m.  GENERAL:alert, in no acute distress and comfortable, patient is wheelchair SKIN: no acute rashes, no significant lesions EYES: conjunctiva are pink and non-injected, sclera anicteric OROPHARYNX: MMM, no exudates, no oropharyngeal erythema or ulceration NECK: supple, no JVD LYMPH:  no palpable lymphadenopathy in the cervical, axillary or inguinal regions LUNGS: clear to auscultation b/l with normal respiratory effort HEART: regular rate & rhythm ABDOMEN:  normoactive bowel sounds , non tender, not distended. Extremity: no pedal edema PSYCH: alert & oriented x 3 with fluent speech NEURO: no focal motor/sensory deficits  LABORATORY DATA:   I have reviewed the data as listed  . CBC Latest Ref Rng & Units 12/02/2016 11/19/2016 11/09/2016  WBC 4.0 - 10.5 K/uL 5.4 5.3 4.4  Hemoglobin 12.0 - 15.0 g/dL 11.2(L) 10.8(L) 10.5(L)  Hematocrit 36.0 - 46.0 % 33.7(L) 32.2(L) 30.6(L)  Platelets 150 - 400 K/uL 74(L) 76(L) 62(L)    . CMP Latest Ref Rng & Units 12/02/2016 11/09/2016 10/26/2016  Glucose 65 - 99 mg/dL 383(H) 205(H) 305(H)  BUN 6 - 20 mg/dL 21(H) 18 19  Creatinine 0.44 - 1.00 mg/dL 1.08(H) 0.91 1.04(H)  Sodium 135 - 145 mmol/L 134(L) 137 134(L)  Potassium 3.5 - 5.1 mmol/L 4.3 3.9 4.0  Chloride 101 - 111 mmol/L 99(L) 102 101  CO2 22 - 32 mmol/L '25 25 24  '$ Calcium 8.9 - 10.3 mg/dL 9.1 9.5 8.9  Total Protein 6.5 - 8.1 g/dL 6.8 6.7 6.4(L)  Total Bilirubin 0.3 - 1.2 mg/dL 0.4 0.5 0.4  Alkaline Phos 38 - 126 U/L 69 68 66  AST 15 - 41 U/L 28 27 35  ALT 14 - 54 U/L '19 16 25     '$ RADIOGRAPHIC  STUDIES: I have personally reviewed the radiological images as listed and agreed with the findings in the report. No results found.  ASSESSMENT & PLAN:   #1 Patient with metastatic endometrial/ovarian cancer.  Patient is status post several lines of treatment as noted above. She has had limiting cytopenias even with single agent dose reduce gemcitabine which makes it less likely that she will tolerate significant additional chemotherapy. Also her current ECOG performance status is 3. He has significant peripheral neuropathy that could also worsen with Avastin. At this point she is not very symptomatic from her disease. Foundation analysis reveals MSI stable status - this would suggest that she is likely not a good candidate for PD1 inhibitor. Plan -We'll try to get a PDL 1 assessment as well to rule out the possibility that a PD1 inhibitor would be useful. This has been requested. -We discussed the other options of liposomal doxorubicin plus Avastin versus single agent Avastin versus best supportive care's. -At this time the patient notes she is feeling quite fatigued and understands the potential adverse effects of these treatments. She also understands that all the treatments are palliative and not curative. -At this time she chooses to take some additional time to recover from previous treatment toxicities and focus on diet and trying to get physically more active and trying to control her diabetes. -Her last imaging study was on 11/02/2016. -We will see her back in 4 weeks with a repeat CT chest abdomen pelvis without contrast to evaluate her disease status, cytopenias and functional status to determine next treatment options.  CT chest/abd/pelvis wo contrast in 4 weeks RTC with labs and CT in about 1 month with Dr Maylon Peppers  I spent 20 minutes counseling the patient face to face. The total time spent in the appointment was 30 minutes and more than 50% was on counseling and direct patient  cares.    Sullivan Lone MD [place] AAHIVMS [facility] Hematology/Oncology Physician [facility]  (Office):       [phone] (Work cell):  [phone] (Fax):           [phone]

## 2017-01-03 ENCOUNTER — Encounter (HOSPITAL_COMMUNITY): Payer: Self-pay

## 2017-01-11 ENCOUNTER — Telehealth: Payer: Self-pay

## 2017-01-11 NOTE — Telephone Encounter (Signed)
Called from [facility] to verify CT imaging scheduled for tomorrow would be performed as ordered. Authorization incorrect. Message sent to [facility] for correction.

## 2017-01-12 ENCOUNTER — Ambulatory Visit (HOSPITAL_COMMUNITY)
Admission: RE | Admit: 2017-01-12 | Discharge: 2017-01-12 | Disposition: A | Discharge status: Home / Self Care | Payer: Medicare HMO | Source: Ambulatory Visit | Attending: Hematology | Admitting: Hematology

## 2017-01-12 DIAGNOSIS — C569 Malignant neoplasm of unspecified ovary: Secondary | ICD-10-CM | POA: Diagnosis present

## 2017-01-12 DIAGNOSIS — K639 Disease of intestine, unspecified: Secondary | ICD-10-CM | POA: Diagnosis not present

## 2017-01-12 DIAGNOSIS — J9809 Other diseases of bronchus, not elsewhere classified: Secondary | ICD-10-CM | POA: Diagnosis not present

## 2017-01-12 DIAGNOSIS — I700 Atherosclerosis of aorta: Secondary | ICD-10-CM | POA: Insufficient documentation

## 2017-01-12 DIAGNOSIS — M799 Soft tissue disorder, unspecified: Secondary | ICD-10-CM | POA: Insufficient documentation

## 2017-01-12 DIAGNOSIS — R590 Localized enlarged lymph nodes: Secondary | ICD-10-CM | POA: Diagnosis not present

## 2017-01-12 DIAGNOSIS — I2510 Atherosclerotic heart disease of native coronary artery without angina pectoris: Secondary | ICD-10-CM | POA: Diagnosis not present

## 2017-01-14 ENCOUNTER — Ambulatory Visit (HOSPITAL_COMMUNITY): Payer: Medicare HMO | Admitting: Oncology

## 2017-01-14 ENCOUNTER — Encounter (HOSPITAL_COMMUNITY): Payer: Medicare HMO | Attending: Hematology | Admitting: Oncology

## 2017-01-14 ENCOUNTER — Encounter (HOSPITAL_COMMUNITY): Payer: Self-pay | Admitting: Oncology

## 2017-01-14 VITALS — BP 151/61 | HR 83 | Resp 16 | Ht 69.0 in | Wt 240.0 lb

## 2017-01-14 DIAGNOSIS — C541 Malignant neoplasm of endometrium: Secondary | ICD-10-CM | POA: Diagnosis not present

## 2017-01-14 DIAGNOSIS — Z95828 Presence of other vascular implants and grafts: Secondary | ICD-10-CM

## 2017-01-14 DIAGNOSIS — D696 Thrombocytopenia, unspecified: Secondary | ICD-10-CM

## 2017-01-14 DIAGNOSIS — C770 Secondary and unspecified malignant neoplasm of lymph nodes of head, face and neck: Secondary | ICD-10-CM | POA: Diagnosis not present

## 2017-01-14 DIAGNOSIS — Z7189 Other specified counseling: Secondary | ICD-10-CM

## 2017-01-14 DIAGNOSIS — D61818 Other pancytopenia: Secondary | ICD-10-CM

## 2017-01-14 DIAGNOSIS — C569 Malignant neoplasm of unspecified ovary: Secondary | ICD-10-CM

## 2017-01-14 DIAGNOSIS — D649 Anemia, unspecified: Secondary | ICD-10-CM

## 2017-01-14 MED ORDER — FENTANYL 25 MCG/HR TD PT72: 25.0000 ug | MEDICATED_PATCH | TRANSDERMAL | 0 refills | Status: DC

## 2017-01-14 NOTE — Progress Notes (Signed)
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 [name] and associates, pathology could not be certain that these were not 2 separate primaries rather than one metastatic process from the uterus to the ovary.      08/15/2013 - 12/06/2013 Chemotherapy    Carboplatin/Taxol with dose reduction of 50% and Taxol due to grade 2+ peripheral neuropathy and gabapentin induced nightmare/hallucinations. 5 out of 6 cycles given with the final cycle being cancelled secondary to significant thrombocytopenia/intolerance.      09/10/2013 - 10/17/2013 Radiation Therapy    5040 cGy delivered by EBRT followed by intravaginal brachytherapy.      04/30/2014 PET scan    PET scan consistent with recurrent disease of either ovarian cancer or endometrial cancer.      06/03/2014 - 07/15/2014 Radiation Therapy    5040 cGy over 28 fractions delivered to the low periaortic area lymph node either metastatic endometrial or metastatic or ovarian cancer.      07/19/2014 Imaging    Imaging concerning for new mesenteric disease and subdiaphragmatic disease.      08/05/2014 PET scan    No evidence of progression of disease.      08/05/2014 Remission    Negative PET scan.      06/11/2016 PET scan    1. Hypermetabolic aortocaval and right common iliac adenopathy, maximum SUV 17.1, compatible with malignancy. 2. Calcified left mesenteric mass has only low-grade activity, maximum SUV 5.3, merit surveillance. 3. There several small calcifications along the omentum which are not currently metabolically active. 4. Along the scarring in operative site  from prior laparotomy there some faintly accentuated metabolic activity which is probably related to the original wound and unlikely to be from tumor. 5. Coronary, aortic arch, and branch vessel atherosclerotic vascular disease.      06/11/2016 Relapse/Recurrence    PET scan with aortocaval & right iliac adenopathy consistent with malignancy.       08/03/2016 -  Chemotherapy    Carboplatin Day 1/Gemzar Days 1&8 every 21 days       08/10/2016 Treatment Plan Change    Day 8 cycle 1 cancelled due to cytopenia      09/07/2016 Treatment Plan Change    Treatment delayed x 1 week due to thrombocytopenia.  Hypomagnesemia noted and replaced IV.      09/08/2016 - 09/12/2016 Hospital Admission    Admit date: 09/08/2016  Admission diagnosis: Seizure Additional comments: Intubated on 3/7 for airway protection and extubated on 09/09/2016.  Hospital course complicated by pancytopenia secondary to systemic chemotherapy.      09/08/2016 Imaging    EEG- This sedated EEG is abnormal due to diffuse slowing of the waking background.  Clinical Correlation of the above findings indicates diffuse cerebral dysfunction that is non-specific in etiology and can be seen with hypoxic/ischemic injury, toxic/metabolic encephalopathies, neurodegenerative disorders, or medication effect.  The absence of epileptiform discharges does not rule out a clinical diagnosis of epilepsy.  Clinical correlation is advised.      09/12/2016 Imaging    CT head- No metastatic disease or acute intracranial abnormality. Normal for age CT appearance of the brain.  09/21/2016 Treatment Plan Change    Carboplatin deleted from treatment plan.  Will pursue single-agent Gemcitabine at reduced dose.      11/19/2016 Relapse/Recurrence    Diagnosis Lymph node, needle/core biopsy, left supraclavicular METASTATIC ADENOCARCINOMA, CONSISTENT WITH GYNECOLOGICAL PRIMARY.       INTERVAL HISTORY:  Patient Is here for follow-up of her  metastatic GYN malignancy which was noted to be progressive based on a biopsy of her left supraclavicular lymph node that showed metastatic adenocarcinoma consistent with a GYN primary. She had progressed on single agent gemcitabine. Has had issues with cytopenias and poor functional status. Her current ECOG performance status is a 3. Notes poor appetite and significant fatigue and appears to have a limited bone marrow reserve. She had Foundation 1 testing on the lymph node which shows MSI stable status. PDL 1 testing was not sent and this has been requested. We discussed that all treatments would be palliative and given her poor performance status and lack of overt he symptomatically disease at this time it would be reasonable to take some additional time prior to additional treatments and focus on nutrition and physical activity. We will have her come back after PDL 1 results in about a month to determine if she would be a candidate for PD1 inhibitor treatment. She is also noted to have uncontrolled diabetes which needs to be optimized with blood sugars in the 300s. Still with significant peripheral neuropathy.  Patient is here for ongoing evaluation and treatment consideration.  Patient continues to have pain.  Had a CT scan which shows stable disease slightly elevated CA-125.  PDL 1 is 0% and MSI stable Foundation 1 does not have any significant targetable mutation Patient continues to have pain so was started on fentanyl patch with oxycodone as a breakthrough pain medication.   REVIEW OF SYSTEMS:    10 Point review of systems of done and is negative except as noted above.  . Past Medical History:  Diagnosis Date  . Anemia   . Asthma   . Chemotherapy induced neutropenia ([place])   . Chemotherapy induced thrombocytopenia   . Diabetes mellitus without complication ([place])   . Endometrial cancer ([place])   . Hot flashes   . Hypertension   . Malignant neoplasm ([place])   . Malignant neoplasm of  ovary ([place]) 04/30/2013  . Obesity   . Ovarian cancer ([place])   . Pancytopenia ([employer])   . Peripheral artery disease ([place])   . Peripheral neuropathy   . Pneumonia 2015  . Port-a-cath in place   . Seizures ([place]) 09/08/2016   ?? due to low magnesium    . Past Surgical History:  Procedure Laterality Date  . ABDOMINAL HYSTERECTOMY  2014    . Social History  Substance Use Topics  . Smoking status: Never Smoker  . Smokeless tobacco: Never Used  . Alcohol use No    ALLERGIES:  is allergic to diphenhydramine; iodinated diagnostic agents; duloxetine hcl; ibuprofen; and penicillins.  MEDICATIONS:  Current Outpatient Prescriptions  Medication Sig Dispense Refill  . acetaminophen (TYLENOL) 500 MG tablet Take 500 mg by mouth every 6 (six) hours as needed.    Marland Kitchen aspirin EC 325 MG tablet Take 325 mg by mouth daily.     . cetirizine (ZYRTEC) 10 MG tablet Take 10 mg by mouth daily.     . Cholecalciferol (VITAMIN D PO) Take 1 tablet by mouth daily.    . cyanocobalamin 1000 MCG tablet Take 1,000 mcg by mouth daily.    Marland Kitchen  glipiZIDE (GLUCOTROL) 10 MG tablet TAKE 1 TABLET BY MOUTH TWICE A DAY  3  . levETIRAcetam (KEPPRA) 500 MG tablet TAKE 1 TABLET (500 MG TOTAL) BY MOUTH 2 (TWO) TIMES DAILY. 60 tablet 1  . lisinopril (PRINIVIL,ZESTRIL) 40 MG tablet Take 40 mg by mouth daily.     Marland Kitchen loperamide (IMODIUM) 2 MG capsule Take 2 mg by mouth as needed for diarrhea or loose stools.     . magnesium oxide (MAG-OX) 400 (241.3 Mg) MG tablet Take 1 tablet (400 mg total) by mouth 2 (two) times daily. 60 tablet 2  . metFORMIN (GLUCOPHAGE) 1000 MG tablet Take 1,000 mg by mouth 2 (two) times daily with a meal.     . Omega-3 Fatty Acids (FISH OIL) 1000 MG CAPS Take 1,000 mg by mouth daily.     . ondansetron (ZOFRAN) 8 MG tablet Take 1 tablet (8 mg total) by mouth every 8 (eight) hours as needed for nausea or vomiting. 30 tablet 2  . ondansetron (ZOFRAN-ODT) 4 MG disintegrating tablet Take 4 mg by mouth every 8 (eight)  hours as needed for nausea or vomiting.     Marland Kitchen oxyCODONE-acetaminophen (PERCOCET) 10-325 MG tablet Take 1-2 tablets by mouth every 4 (four) hours as needed for pain. 180 tablet 0  . Polyethylene Glycol 3350 (MIRALAX PO) Take 17 g by mouth daily as needed for moderate constipation.     . prochlorperazine (COMPAZINE) 10 MG tablet TAKE 1 TABLET (10 MG TOTAL) BY MOUTH EVERY 6 (SIX) HOURS AS NEEDED FOR NAUSEA OR VOMITING. 30 tablet 2   No current facility-administered medications for this visit.     PHYSICAL EXAMINATION: ECOG PERFORMANCE STATUS: 3 - Symptomatic, >50% confined to bed  . There were no vitals filed for this visit.  There were no vitals filed for this visit. .There is no height or weight on file to calculate BMI.  GENERAL:alert, in no acute distress and comfortable, patient is wheelchair SKIN: no acute rashes, no significant lesions EYES: conjunctiva are pink and non-injected, sclera anicteric OROPHARYNX: MMM, no exudates, no oropharyngeal erythema or ulceration NECK: supple, no JVD LYMPH:  no palpable lymphadenopathy in the cervical, axillary or inguinal regions LUNGS: clear to auscultation b/l with normal respiratory effort HEART: regular rate & rhythm ABDOMEN:  normoactive bowel sounds , non tender, not distended. Extremity: no pedal edema PSYCH: alert & oriented x 3 with fluent speech NEURO: no focal motor/sensory deficits  LABORATORY DATA:   I have reviewed the data as listed  . CBC Latest Ref Rng & Units 12/02/2016 11/19/2016 11/09/2016  WBC 4.0 - 10.5 K/uL 5.4 5.3 4.4  Hemoglobin 12.0 - 15.0 g/dL 11.2(L) 10.8(L) 10.5(L)  Hematocrit 36.0 - 46.0 % 33.7(L) 32.2(L) 30.6(L)  Platelets 150 - 400 K/uL 74(L) 76(L) 62(L)    . CMP Latest Ref Rng & Units 12/02/2016 11/09/2016 10/26/2016  Glucose 65 - 99 mg/dL 383(H) 205(H) 305(H)  BUN 6 - 20 mg/dL 21(H) 18 19  Creatinine 0.44 - 1.00 mg/dL 1.08(H) 0.91 1.04(H)  Sodium 135 - 145 mmol/L 134(L) 137 134(L)  Potassium 3.5 - 5.1  mmol/L 4.3 3.9 4.0  Chloride 101 - 111 mmol/L 99(L) 102 101  CO2 22 - 32 mmol/L _0 Calcium 8.9 - 10.3 mg/dL 9.1 9.5 8.9  Total Protein 6.5 - 8.1 g/dL 6.8 6.7 6.4(L)  Total Bilirubin 0.3 - 1.2 mg/dL 0.4 0.5 0.4  Alkaline Phos 38 - 126 U/L 69 68 66  AST 15 - 41 U/L 28 27 35  ALT 14 - 54 U/L _0 RADIOGRAPHIC STUDIES: I have personally reviewed the radiological images as listed and agreed with the findings in the report. Ct Abdomen Pelvis Wo Contrast  Result Date: 01/12/2017 CLINICAL DATA:  Metastatic ovarian cancer. Low pelvic pain. Diabetes. IV contrast allergy. EXAM: CT CHEST, ABDOMEN AND PELVIS WITHOUT CONTRAST TECHNIQUE: Multidetector CT imaging of the chest, abdomen and pelvis was performed following the standard protocol without IV contrast. COMPARISON:  11/02/2016 FINDINGS: CT CHEST FINDINGS Cardiovascular: Coronary, aortic arch, and branch vessel atherosclerotic vascular disease. Right Port-A-Cath tip: Cavoatrial junction. Mediastinum/Nodes: Enlargement of the left supraclavicular fossa lymph node, 1.5 cm in short axis, previously 1.2 cm. This sits just anterior to the left subclavian artery, and when biopsied on 11/19/2016 this demonstrated metastatic adenocarcinoma of Mullerian origin. Lungs/Pleura: Unremarkable Musculoskeletal: Deformity left proximal humerus related old healed fracture. Lower thoracic spondylosis. CT ABDOMEN PELVIS FINDINGS Hepatobiliary: Mild diffuse hepatic steatosis with some fatty sparing along the gallbladder fossa. Faint nodularity along medial margin of the right hepatic lobe with punctate calcification on image 55/2, no change and not previously hypermetabolic in this vicinity. Pancreas: Unremarkable Spleen: Unremarkable Adrenals/Urinary Tract: Unremarkable Stomach/Bowel: Unremarkable Vascular/Lymphatic: Aortoiliac atherosclerotic vascular disease. As before, there is loss of tissue planes between the abdominal aorta and IVC confluent with  retroperitoneal density difficult to measure separate from the IVC on today's noncontrast exam. The density extends across to the anterior margin of the right psoas muscle and does not appear appreciably changed. This tissue was hypermetabolic on 09/38/1829. Reproductive: Uterus absent.  Adnexa unremarkable, ovaries not seen. Other: In the left abdominal mesentery there is dense calcification in a mass which is adjacent to small bowel loops. The mass measures approximately 3.6 by 2.9 cm an by my measurement was formerly 3.4 by 2.9 cm. This lesion was previously mildly hypermetabolic. There several punctate calcifications along the omentum without significant associated soft tissue density, an without well-defined omental caking. Musculoskeletal: Small ventral hernia of adipose tissues along the upper margin of the prior midline pelvic incision site. Stable appearance of stranding along the perirectal fascia and presacral region, some of which may be therapy related. Stable density along the left buttock region, possibly injection related. Cutaneous thickening along the pannus. IMPRESSION: 1. Enlarging left supraclavicular fossa lymph node, 1.5 cm in short axis (formerly 1.2 cm, and formerly hypermetabolic). Prior biopsy of this lymph node showed metastatic adenocarcinoma. 2. Calcified left mesenteric mass, 3.6 by 2.9 cm, essentially stable. 3. Stable soft tissue density in the aortocaval region with loss of local fat planes. The associated soft tissue density was also previously hypermetabolic. 4.  Aortic Atherosclerosis (ICD10-I70.0).  Coronary atherosclerosis. Electronically Signed   By: Van Clines M.D.   On: 01/12/2017 09:25   Ct Chest Wo Contrast  Result Date: 01/12/2017 CLINICAL DATA:  Metastatic ovarian cancer. Low pelvic pain. Diabetes. IV contrast allergy. EXAM: CT CHEST, ABDOMEN AND PELVIS WITHOUT CONTRAST TECHNIQUE: Multidetector CT imaging of the chest, abdomen and pelvis was performed  following the standard protocol without IV contrast. COMPARISON:  11/02/2016 FINDINGS: CT CHEST FINDINGS Cardiovascular: Coronary, aortic arch, and branch vessel atherosclerotic vascular disease. Right Port-A-Cath tip: Cavoatrial junction. Mediastinum/Nodes: Enlargement of the left supraclavicular fossa lymph node, 1.5 cm in short axis, previously 1.2 cm. This sits just anterior to the left subclavian artery, and when biopsied on 11/19/2016 this demonstrated metastatic adenocarcinoma of Mullerian origin. Lungs/Pleura: Unremarkable Musculoskeletal: Deformity left proximal humerus related old healed fracture. Lower thoracic spondylosis. CT ABDOMEN PELVIS  FINDINGS Hepatobiliary: Mild diffuse hepatic steatosis with some fatty sparing along the gallbladder fossa. Faint nodularity along medial margin of the right hepatic lobe with punctate calcification on image 55/2, no change and not previously hypermetabolic in this vicinity. Pancreas: Unremarkable Spleen: Unremarkable Adrenals/Urinary Tract: Unremarkable Stomach/Bowel: Unremarkable Vascular/Lymphatic: Aortoiliac atherosclerotic vascular disease. As before, there is loss of tissue planes between the abdominal aorta and IVC confluent with retroperitoneal density difficult to measure separate from the IVC on today's noncontrast exam. The density extends across to the anterior margin of the right psoas muscle and does not appear appreciably changed. This tissue was hypermetabolic on 31/42/7670. Reproductive: Uterus absent.  Adnexa unremarkable, ovaries not seen. Other: In the left abdominal mesentery there is dense calcification in a mass which is adjacent to small bowel loops. The mass measures approximately 3.6 by 2.9 cm an by my measurement was formerly 3.4 by 2.9 cm. This lesion was previously mildly hypermetabolic. There several punctate calcifications along the omentum without significant associated soft tissue density, an without well-defined omental caking.  Musculoskeletal: Small ventral hernia of adipose tissues along the upper margin of the prior midline pelvic incision site. Stable appearance of stranding along the perirectal fascia and presacral region, some of which may be therapy related. Stable density along the left buttock region, possibly injection related. Cutaneous thickening along the pannus. IMPRESSION: 1. Enlarging left supraclavicular fossa lymph node, 1.5 cm in short axis (formerly 1.2 cm, and formerly hypermetabolic). Prior biopsy of this lymph node showed metastatic adenocarcinoma. 2. Calcified left mesenteric mass, 3.6 by 2.9 cm, essentially stable. 3. Stable soft tissue density in the aortocaval region with loss of local fat planes. The associated soft tissue density was also previously hypermetabolic. 4.  Aortic Atherosclerosis (ICD10-I70.0).  Coronary atherosclerosis. Electronically Signed   By: Van Clines M.D.   On: 01/12/2017 09:25    ASSESSMENT & PLAN:   #1 Patient with metastatic endometrial/ovarian cancer.  Patient is status post several lines of treatment as noted above. She has had limiting cytopenias even with single agent dose reduce gemcitabine which makes it less likely that she will tolerate significant additional chemotherapy. Also her current ECOG performance status is 3. We will start patient on fentanyl patch 25 g.  With oxycodone-Tylenol as a breakthrough pain medication We discussed possibility of continuing end-of-life care rather than going with any chemotherapy at present time.Sullivan Lone MD [place] AAHIVMS [facility] Hematology/Oncology Physician [facility]  (Office):       [phone] (Work cell):  [phone] (Fax):           [phone]

## 2017-01-14 NOTE — Patient Instructions (Addendum)
[place] at [facility] Discharge Instructions  RECOMMENDATIONS MADE BY THE CONSULTANT AND ANY TEST RESULTS WILL BE SENT TO YOUR REFERRING PHYSICIAN.  You were seen today by Dr. Oliva Bustard. Refill given on pain medication. Return in 4 weeks for labs and follow up.    Thank you for choosing [place] at [facility] to provide your oncology and hematology care.  To afford each patient quality time with our provider, please arrive at least 15 minutes before your scheduled appointment time.    If you have a lab appointment with the [facility] please come in thru the  Main Entrance and check in at the main information desk  You need to re-schedule your appointment should you arrive 10 or more minutes late.  We strive to give you quality time with our providers, and arriving late affects you and other patients whose appointments are after yours.  Also, if you no show three or more times for appointments you may be dismissed from the clinic at the providers discretion.     Again, thank you for choosing [facility].  Our hope is that these requests will decrease the amount of time that you wait before being seen by our physicians.       _____________________________________________________________  Should you have questions after your visit to [facility], please contact our office at [phone] between the hours of 8:30 a.m. and 4:30 p.m.  Voicemails left after 4:30 p.m. will not be returned until the following business day.  For prescription refill requests, have your pharmacy contact our office.       Resources For Cancer Patients and their Caregivers ? American Cancer Society: Can assist with transportation, wigs, general needs, runs Look Good Feel Better.        204-307-6458 ? Cancer Care: Provides financial assistance, online support groups, medication/co-pay assistance.  1-800-813-HOPE 657-783-5222) ? [place] Assists [place] cancer patients and their families through emotional , educational and financial support.  6366643060 ? [place] DSS Where to apply for food stamps, Medicaid and utility assistance. (704)409-4400 ? [facility]: Transportation to medical appointments. (605)777-3050 ? Social Security Administration: May apply for disability if have a Stage IV cancer. (502) 509-3931 6302204944 ? [employer], Disability and Transit Services: Assists with nutrition, care and transit needs. [place] Support Programs: @10RELATIVEDAYS @ > Cancer Support Group  2nd Tuesday of the month 1pm-2pm, Journey Room  > Creative Journey  3rd Tuesday of the month 1130am-1pm, Journey Room  > Look Good Feel Better  1st Wednesday of the month 10am-12 noon, Journey Room (Call [place] to register [phone])

## 2017-01-26 ENCOUNTER — Other Ambulatory Visit (HOSPITAL_COMMUNITY): Payer: Self-pay | Admitting: Adult Health

## 2017-01-26 ENCOUNTER — Encounter (HOSPITAL_COMMUNITY): Payer: Self-pay | Admitting: Adult Health

## 2017-01-26 DIAGNOSIS — C569 Malignant neoplasm of unspecified ovary: Secondary | ICD-10-CM

## 2017-01-26 MED ORDER — OXYCODONE-ACETAMINOPHEN 10-325 MG PO TABS: 1.0000 | ORAL_TABLET | ORAL | 0 refills | Status: DC | PRN

## 2017-01-26 NOTE — Progress Notes (Signed)
Patient called cancer center requesting refill of Percocet.   [place] Controlled Substance Reporting System reviewed and refill is appropriate on or after 01/28/17. Paper prescription printed & post-dated; Rx left at cancer center front desk for patient to retrieve after showing photo ID per clinic policy.   [place] reviewed:     Mike Craze, NP [place] [phone]

## 2017-01-27 ENCOUNTER — Encounter (HOSPITAL_COMMUNITY): Payer: Self-pay

## 2017-02-11 ENCOUNTER — Encounter (HOSPITAL_COMMUNITY): Payer: Self-pay | Admitting: Adult Health

## 2017-02-11 ENCOUNTER — Encounter (HOSPITAL_COMMUNITY): Payer: Self-pay | Admitting: Oncology

## 2017-02-11 ENCOUNTER — Encounter (HOSPITAL_COMMUNITY): Payer: Medicare HMO | Attending: Oncology | Admitting: Oncology

## 2017-02-11 ENCOUNTER — Other Ambulatory Visit (HOSPITAL_COMMUNITY): Payer: Medicare HMO

## 2017-02-11 VITALS — BP 129/52 | HR 95 | Resp 18 | Ht 69.0 in | Wt 239.0 lb

## 2017-02-11 DIAGNOSIS — C569 Malignant neoplasm of unspecified ovary: Secondary | ICD-10-CM

## 2017-02-11 DIAGNOSIS — C770 Secondary and unspecified malignant neoplasm of lymph nodes of head, face and neck: Secondary | ICD-10-CM | POA: Diagnosis not present

## 2017-02-11 DIAGNOSIS — D759 Disease of blood and blood-forming organs, unspecified: Secondary | ICD-10-CM

## 2017-02-11 DIAGNOSIS — C541 Malignant neoplasm of endometrium: Secondary | ICD-10-CM | POA: Diagnosis not present

## 2017-02-11 DIAGNOSIS — G8918 Other acute postprocedural pain: Secondary | ICD-10-CM

## 2017-02-11 DIAGNOSIS — M2550 Pain in unspecified joint: Secondary | ICD-10-CM

## 2017-02-11 MED ORDER — OXYCODONE HCL 20 MG PO TABS: 1.0000 | ORAL_TABLET | ORAL | 0 refills | Status: DC | PRN

## 2017-02-11 NOTE — Progress Notes (Signed)
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 [name] and associates, pathology could not be certain that these were not 2 separate primaries rather than one metastatic process from the uterus to the ovary.      08/15/2013 - 12/06/2013 Chemotherapy    Carboplatin/Taxol with dose reduction of 50% and Taxol due to grade 2+ peripheral neuropathy and gabapentin induced nightmare/hallucinations. 5 out of 6 cycles given with the final cycle being cancelled secondary to significant thrombocytopenia/intolerance.      09/10/2013 - 10/17/2013 Radiation Therapy    5040 cGy delivered by EBRT followed by intravaginal brachytherapy.      04/30/2014 PET scan    PET scan consistent with recurrent disease of either ovarian cancer or endometrial cancer.      06/03/2014 - 07/15/2014 Radiation Therapy    5040 cGy over 28 fractions delivered to the low periaortic area lymph node either metastatic endometrial or metastatic or ovarian cancer.      07/19/2014 Imaging    Imaging concerning for new mesenteric disease and subdiaphragmatic disease.      08/05/2014 PET scan    No evidence of progression of disease.      08/05/2014 Remission    Negative PET scan.      06/11/2016 PET scan    1. Hypermetabolic aortocaval and right common iliac adenopathy, maximum SUV 17.1, compatible with malignancy. 2. Calcified left mesenteric mass has only low-grade activity, maximum SUV 5.3, merit surveillance. 3. There several small calcifications along the omentum which are not currently metabolically active. 4. Along the scarring in operative site  from prior laparotomy there some faintly accentuated metabolic activity which is probably related to the original wound and unlikely to be from tumor. 5. Coronary, aortic arch, and branch vessel atherosclerotic vascular disease.      06/11/2016 Relapse/Recurrence    PET scan with aortocaval & right iliac adenopathy consistent with malignancy.       08/03/2016 -  Chemotherapy    Carboplatin Day 1/Gemzar Days 1&8 every 21 days       08/10/2016 Treatment Plan Change    Day 8 cycle 1 cancelled due to cytopenia      09/07/2016 Treatment Plan Change    Treatment delayed x 1 week due to thrombocytopenia.  Hypomagnesemia noted and replaced IV.      09/08/2016 - 09/12/2016 Hospital Admission    Admit date: 09/08/2016  Admission diagnosis: Seizure Additional comments: Intubated on 3/7 for airway protection and extubated on 09/09/2016.  Hospital course complicated by pancytopenia secondary to systemic chemotherapy.      09/08/2016 Imaging    EEG- This sedated EEG is abnormal due to diffuse slowing of the waking background.  Clinical Correlation of the above findings indicates diffuse cerebral dysfunction that is non-specific in etiology and can be seen with hypoxic/ischemic injury, toxic/metabolic encephalopathies, neurodegenerative disorders, or medication effect.  The absence of epileptiform discharges does not rule out a clinical diagnosis of epilepsy.  Clinical correlation is advised.      09/12/2016 Imaging    CT head- No metastatic disease or acute intracranial abnormality. Normal for age CT appearance of the brain.  09/21/2016 Treatment Plan Change    Carboplatin deleted from treatment plan.  Will pursue single-agent Gemcitabine at reduced dose.      11/19/2016 Relapse/Recurrence    Diagnosis Lymph node, needle/core biopsy, left supraclavicular METASTATIC ADENOCARCINOMA, CONSISTENT WITH GYNECOLOGICAL PRIMARY.       INTERVAL HISTORY:  Patient Is here for follow-up of her  metastatic GYN malignancy which was noted to be progressive based on a biopsy of her left supraclavicular lymph node that showed metastatic adenocarcinoma consistent with a GYN primary. She had progressed on single agent gemcitabine. Has had issues with cytopenias and poor functional status. Her current ECOG performance status is a 3. Notes poor appetite and significant fatigue and appears to have a limited bone marrow reserve. She had Foundation 1 testing on the lymph node which shows MSI stable status. PDL 1 testing was not sent and this has been requested. We discussed that all treatments would be palliative and given her poor performance status and lack of overt he symptomatically disease at this time it would be reasonable to take some additional time prior to additional treatments and focus on nutrition and physical activity. We will have her come back after PDL 1 results in about a month to determine if she would be a candidate for PD1 inhibitor treatment. She is also noted to have uncontrolled diabetes which needs to be optimized with blood sugars in the 300s. Still with significant peripheral neuropathy.  Patient is here for ongoing evaluation and treatment consideration.  Patient continues to have pain.  Had a CT scan which shows stable disease slightly elevated CA-125.  PDL 1 is 0% and MSI stable Foundation 1 does not have any significant targetable mutation Patient continues to have pain so was started on fentanyl patch with oxycodone as a breakthrough pain medication.  Patient continues to have increasing abdominal pain.  According to her she did not get any benefit out of fentanyl patch.  She is here for further follow-up and treatment consideration  Abdomen appetite remains stable.  Abdominal pain is continues and .  5 out of 10   REVIEW OF SYSTEMS:    10 Point review of systems of done and is negative except as noted above.  . Past Medical History:  Diagnosis Date  . Anemia     . Asthma   . Chemotherapy induced neutropenia ([place])   . Chemotherapy induced thrombocytopenia   . Diabetes mellitus without complication ([place])   . Endometrial cancer ([place])   . Hot flashes   . Hypertension   . Malignant neoplasm ([place])   . 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 Obesity   . Ovarian cancer ([place])   . Pancytopenia ([place])   . Peripheral artery disease ([place])   . Peripheral neuropathy   . Pneumonia 2015  . Port-a-cath in place   . Seizures ([place]) 09/08/2016   ?? due to low magnesium    . Past Surgical History:  Procedure Laterality Date  . ABDOMINAL HYSTERECTOMY  2014    . Social History  Substance Use Topics  . Smoking status: Never Smoker  . Smokeless tobacco: Never Used  . Alcohol use No    ALLERGIES:  is allergic to diphenhydramine; iodinated diagnostic agents; duloxetine hcl; ibuprofen; and penicillins.  MEDICATIONS:  Current Outpatient Prescriptions  Medication Sig Dispense Refill  . acetaminophen (TYLENOL) 500 MG tablet Take 500 mg by mouth every 6 (six) hours as needed.    Marland Kitchen aspirin EC 325 MG tablet Take 325 mg by  mouth daily.     . cetirizine (ZYRTEC) 10 MG tablet Take 10 mg by mouth daily.     . Cholecalciferol (VITAMIN D PO) Take 1 tablet by mouth daily.    . cyanocobalamin 1000 MCG tablet Take 1,000 mcg by mouth daily.    Marland Kitchen glipiZIDE (GLUCOTROL) 10 MG tablet TAKE 1 TABLET BY MOUTH TWICE A DAY  3  . levETIRAcetam (KEPPRA) 500 MG tablet TAKE 1 TABLET (500 MG TOTAL) BY MOUTH 2 (TWO) TIMES DAILY. 60 tablet 1  . lisinopril (PRINIVIL,ZESTRIL) 40 MG tablet Take 40 mg by mouth daily.     Marland Kitchen loperamide (IMODIUM) 2 MG capsule Take 2 mg by mouth as needed for diarrhea or loose stools.     . magnesium oxide (MAG-OX) 400 (241.3 Mg) MG tablet Take 1 tablet (400 mg total) by mouth 2 (two) times daily. 60 tablet 2  . metFORMIN (GLUCOPHAGE) 1000 MG tablet Take 1,000 mg by mouth 2 (two) times daily with a meal.     . Omega-3 Fatty Acids (FISH OIL) 1000 MG  CAPS Take 1,000 mg by mouth daily.     . ondansetron (ZOFRAN) 8 MG tablet Take 1 tablet (8 mg total) by mouth every 8 (eight) hours as needed for nausea or vomiting. 30 tablet 2  . ondansetron (ZOFRAN-ODT) 4 MG disintegrating tablet Take 4 mg by mouth every 8 (eight) hours as needed for nausea or vomiting.     Marland Kitchen oxyCODONE-acetaminophen (PERCOCET) 10-325 MG tablet Take 1-2 tablets by mouth every 4 (four) hours as needed for pain. 180 tablet 0  . Polyethylene Glycol 3350 (MIRALAX PO) Take 17 g by mouth daily as needed for moderate constipation.     . prochlorperazine (COMPAZINE) 10 MG tablet TAKE 1 TABLET (10 MG TOTAL) BY MOUTH EVERY 6 (SIX) HOURS AS NEEDED FOR NAUSEA OR VOMITING. 30 tablet 2  . fentaNYL (DURAGESIC - DOSED MCG/HR) 25 MCG/HR patch Place 1 patch (25 mcg total) onto the skin every 3 (three) days. (Patient not taking: Reported on 02/11/2017) 5 patch 0   No current facility-administered medications for this visit.     PHYSICAL EXAMINATION: ECOG PERFORMANCE STATUS: 3 - Symptomatic, >50% confined to bed  . Vitals:   02/11/17 1402  BP: (!) 129/52  Pulse: 95  Resp: 18  SpO2: 97%    Filed Weights   02/11/17 1402  Weight: 239 lb (108.4 kg)   .Body mass index is 35.29 kg/m.  GENERAL:alert, in no acute distress and comfortable, patient is wheelchair SKIN: no acute rashes, no significant lesions EYES: conjunctiva are pink and non-injected, sclera anicteric OROPHARYNX: MMM, no exudates, no oropharyngeal erythema or ulceration NECK: supple, no JVD LYMPH:  no palpable lymphadenopathy in the cervical, axillary or inguinal regions LUNGS: clear to auscultation b/l with normal respiratory effort HEART: regular rate & rhythm ABDOMEN:  normoactive bowel sounds , non tender, not distended. Extremity: no pedal edema PSYCH: alert & oriented x 3 with fluent speech NEURO: no focal motor/sensory deficits  LABORATORY DATA:   I have reviewed the data as listed  . CBC Latest Ref Rng &  Units 12/02/2016 11/19/2016 11/09/2016  WBC 4.0 - 10.5 K/uL 5.4 5.3 4.4  Hemoglobin 12.0 - 15.0 g/dL 11.2(L) 10.8(L) 10.5(L)  Hematocrit 36.0 - 46.0 % 33.7(L) 32.2(L) 30.6(L)  Platelets 150 - 400 K/uL 74(L) 76(L) 62(L)    . CMP Latest Ref Rng & Units 12/02/2016 11/09/2016 10/26/2016  Glucose 65 - 99 mg/dL 383(H) 205(H) 305(H)  BUN 6 - 20 mg/dL  21(H) 18 19  Creatinine 0.44 - 1.00 mg/dL 1.08(H) 0.91 1.04(H)  Sodium 135 - 145 mmol/L 134(L) 137 134(L)  Potassium 3.5 - 5.1 mmol/L 4.3 3.9 4.0  Chloride 101 - 111 mmol/L 99(L) 102 101  CO2 22 - 32 mmol/L _0 Calcium 8.9 - 10.3 mg/dL 9.1 9.5 8.9  Total Protein 6.5 - 8.1 g/dL 6.8 6.7 6.4(L)  Total Bilirubin 0.3 - 1.2 mg/dL 0.4 0.5 0.4  Alkaline Phos 38 - 126 U/L 69 68 66  AST 15 - 41 U/L 28 27 35  ALT 14 - 54 U/L _1 RADIOGRAPHIC STUDIES: I have personally reviewed the radiological images as listed and agreed with the findings in the report. No results found.  ASSESSMENT & PLAN:   #1 Patient with metastatic endometrial/ovarian cancer.  Patient is status post several lines of treatment as noted above. She has had limiting cytopenias even with single agent dose reduce gemcitabine which makes it less likely that she will tolerate significant additional chemotherapy. Also her current ECOG performance status is 3. We will start patient on fentanyl patch 25 g.  With oxycodone-Tylenol as a breakthrough pain medication We discussed possibility of continuing end-of-life care rather than going with any chemotherapy at present time.. Patient does not want to try long-acting pain medication.  Try oxycodone 20 mg  Every 4 hour withPercocet as a breakthrough pain medication.  (Office):       [phone] (Work cell):  [phone] (Fax):           [phone]

## 2017-02-11 NOTE — Progress Notes (Signed)
Patient seen by Dr. Oliva Bustard in office visit today.  He is concerned about her getting too much Tylenol with her current pain medication regimen. Therefore, the following changes have been made and new prescriptions given.    -For more mild pain, she will continue to take 1 Percocet 10/325.  -For more severe pain, she will take 1 tab of oxycodone 20 mg (new Rx given to pt today).     Discussed with her the importance of limiting total Tylenol to 3,000 mg max per day.    She was able to verbalize these instructions back to me before leaving clinic today. Encouraged her to call us with questions.    Mike Craze, NP [place] [phone]

## 2017-02-24 ENCOUNTER — Other Ambulatory Visit (HOSPITAL_COMMUNITY): Payer: Self-pay | Admitting: Adult Health

## 2017-02-24 ENCOUNTER — Encounter (HOSPITAL_COMMUNITY): Payer: Self-pay | Admitting: Adult Health

## 2017-02-24 DIAGNOSIS — C569 Malignant neoplasm of unspecified ovary: Secondary | ICD-10-CM

## 2017-02-24 MED ORDER — OXYCODONE-ACETAMINOPHEN 10-325 MG PO TABS: 1.0000 | ORAL_TABLET | ORAL | 0 refills | Status: DC | PRN

## 2017-02-24 NOTE — Progress Notes (Signed)
Patient called cancer center requesting refill of Percocet 10/325.   [place] Controlled Substance Reporting System reviewed and refill is appropriate on or after 02/28/17. Paper prescription printed & post-dated; Rx left at cancer center front desk for patient to retrieve after showing photo ID per clinic policy.   [place] reviewed:     Mike Craze, NP [place] [phone]

## 2017-03-05 ENCOUNTER — Other Ambulatory Visit (HOSPITAL_COMMUNITY): Payer: Self-pay | Admitting: Oncology

## 2017-03-05 DIAGNOSIS — G40901 Epilepsy, unspecified, not intractable, with status epilepticus: Secondary | ICD-10-CM

## 2017-03-16 ENCOUNTER — Other Ambulatory Visit (HOSPITAL_COMMUNITY): Payer: Self-pay | Admitting: Adult Health

## 2017-04-04 ENCOUNTER — Encounter (HOSPITAL_COMMUNITY): Payer: Medicare HMO | Attending: Hematology | Admitting: Oncology

## 2017-04-04 ENCOUNTER — Encounter (HOSPITAL_COMMUNITY): Payer: Self-pay | Admitting: Oncology

## 2017-04-04 VITALS — BP 133/54 | HR 83 | Temp 98.9°F | Resp 20 | Wt 239.2 lb

## 2017-04-04 DIAGNOSIS — Z95828 Presence of other vascular implants and grafts: Secondary | ICD-10-CM | POA: Insufficient documentation

## 2017-04-04 DIAGNOSIS — D696 Thrombocytopenia, unspecified: Secondary | ICD-10-CM | POA: Insufficient documentation

## 2017-04-04 DIAGNOSIS — C541 Malignant neoplasm of endometrium: Secondary | ICD-10-CM | POA: Diagnosis not present

## 2017-04-04 DIAGNOSIS — M25542 Pain in joints of left hand: Secondary | ICD-10-CM

## 2017-04-04 DIAGNOSIS — M25541 Pain in joints of right hand: Secondary | ICD-10-CM

## 2017-04-04 DIAGNOSIS — C569 Malignant neoplasm of unspecified ovary: Secondary | ICD-10-CM | POA: Diagnosis not present

## 2017-04-04 DIAGNOSIS — M25552 Pain in left hip: Secondary | ICD-10-CM

## 2017-04-04 DIAGNOSIS — M2550 Pain in unspecified joint: Secondary | ICD-10-CM

## 2017-04-04 DIAGNOSIS — M25551 Pain in right hip: Secondary | ICD-10-CM

## 2017-04-04 DIAGNOSIS — G8918 Other acute postprocedural pain: Secondary | ICD-10-CM

## 2017-04-04 MED ORDER — OXYCODONE-ACETAMINOPHEN 10-325 MG PO TABS: 1.0000 | ORAL_TABLET | ORAL | 0 refills | Status: DC | PRN

## 2017-04-04 MED ORDER — OXYCODONE HCL 20 MG PO TABS: 1.0000 | ORAL_TABLET | ORAL | 0 refills | Status: DC | PRN

## 2017-04-04 NOTE — Progress Notes (Signed)
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 [name] and associates, pathology could not be certain that these were not 2 separate primaries rather than one metastatic process from the uterus to the ovary.      08/15/2013 - 12/06/2013 Chemotherapy    Carboplatin/Taxol with dose reduction of 50% and Taxol due to grade 2+ peripheral neuropathy and gabapentin induced nightmare/hallucinations. 5 out of 6 cycles given with the final cycle being cancelled secondary to significant thrombocytopenia/intolerance.      09/10/2013 - 10/17/2013 Radiation Therapy    5040 cGy delivered by EBRT followed by intravaginal brachytherapy.      04/30/2014 PET scan    PET scan consistent with recurrent disease of either ovarian cancer or endometrial cancer.      06/03/2014 - 07/15/2014 Radiation Therapy    5040 cGy over 28 fractions delivered to the low periaortic area lymph node either metastatic endometrial or metastatic or ovarian cancer.      07/19/2014 Imaging    Imaging concerning for new mesenteric disease and subdiaphragmatic disease.      08/05/2014 PET scan    No evidence of progression of disease.      08/05/2014 Remission    Negative PET scan.      06/11/2016 PET scan    1. Hypermetabolic aortocaval and right common iliac adenopathy, maximum SUV 17.1, compatible with malignancy. 2. Calcified left mesenteric mass has only low-grade activity, maximum SUV 5.3, merit surveillance. 3. There several small calcifications along the omentum which are not currently metabolically active. 4. Along the scarring in operative site  from prior laparotomy there some faintly accentuated metabolic activity which is probably related to the original wound and unlikely to be from tumor. 5. Coronary, aortic arch, and branch vessel atherosclerotic vascular disease.      06/11/2016 Relapse/Recurrence    PET scan with aortocaval & right iliac adenopathy consistent with malignancy.       08/03/2016 -  Chemotherapy    Carboplatin Day 1/Gemzar Days 1&8 every 21 days       08/10/2016 Treatment Plan Change    Day 8 cycle 1 cancelled due to cytopenia      09/07/2016 Treatment Plan Change    Treatment delayed x 1 week due to thrombocytopenia.  Hypomagnesemia noted and replaced IV.      09/08/2016 - 09/12/2016 Hospital Admission    Admit date: 09/08/2016  Admission diagnosis: Seizure Additional comments: Intubated on 3/7 for airway protection and extubated on 09/09/2016.  Hospital course complicated by pancytopenia secondary to systemic chemotherapy.      09/08/2016 Imaging    EEG- This sedated EEG is abnormal due to diffuse slowing of the waking background.  Clinical Correlation of the above findings indicates diffuse cerebral dysfunction that is non-specific in etiology and can be seen with hypoxic/ischemic injury, toxic/metabolic encephalopathies, neurodegenerative disorders, or medication effect.  The absence of epileptiform discharges does not rule out a clinical diagnosis of epilepsy.  Clinical correlation is advised.      09/12/2016 Imaging    CT head- No metastatic disease or acute intracranial abnormality. Normal for age CT appearance of the brain.  09/21/2016 Treatment Plan Change    Carboplatin deleted from treatment plan.  Will pursue single-agent Gemcitabine at reduced dose.      11/19/2016 Relapse/Recurrence    Diagnosis Lymph node, needle/core biopsy, left supraclavicular METASTATIC ADENOCARCINOMA, CONSISTENT WITH GYNECOLOGICAL PRIMARY.       INTERVAL HISTORY:  Patient Is here for follow-up of her  metastatic GYN malignancy which was noted to be progressive based on a biopsy of her left supraclavicular lymph node that showed metastatic adenocarcinoma consistent with a GYN primary. She had progressed on single agent gemcitabine. Has had issues with cytopenias and poor functional status. Her current ECOG performance status is a 3. Notes poor appetite and significant fatigue and appears to have a limited bone marrow reserve. She had Foundation 1 testing on the lymph node which shows MSI stable status.   Patient is currently on comfort measures only. She has not received chemotherapy since April 2018. She states that she continues to have persistent neuropathy in her hands and feet. She also continues have her chronic pains in her bilateral hips, hands and talks. She is currently taking oxycodone 20 mg alternating with Percocet 10-'325mg'$  to minimize the amount of Tylenol that she is taking. She states that her pain is controlled on the current regimen. She states that her appetite has improved since her last visit as well as her energy level. She states that if she she moved more on a certain day than her hip pain would increase which causes her to take more of her pain meds. She denies any chest pain, shortness breath, abdominal pain or bloating.    REVIEW OF SYSTEMS:    10 Point review of systems of done and is negative except as noted above.  . Past Medical History:  Diagnosis Date  . Anemia   . Asthma   . Chemotherapy induced neutropenia ([place])   . Chemotherapy induced thrombocytopenia   . Diabetes mellitus without complication ([place])   . Endometrial cancer ([place])   . Hot flashes   . Hypertension   . Malignant neoplasm ([place])   . 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 Obesity   . Ovarian cancer ([place])   . Pancytopenia ([place])   . Peripheral artery disease ([place])   . Peripheral neuropathy   . Pneumonia 2015  . Port-A-Cath in place   . Seizures ([place]) 09/08/2016   ?? due to low  magnesium    . Past Surgical History:  Procedure Laterality Date  . ABDOMINAL HYSTERECTOMY  2014    . Social History  Substance Use Topics  . Smoking status: Never Smoker  . Smokeless tobacco: Never Used  . Alcohol use No    ALLERGIES:  is allergic to diphenhydramine; iodinated diagnostic agents; duloxetine hcl; ibuprofen; and penicillins.  MEDICATIONS:  Current Outpatient Prescriptions  Medication Sig Dispense Refill  . acetaminophen (TYLENOL) 500 MG tablet Take 500 mg by mouth every 6 (six) hours as needed.    Kathleen Whitehead aspirin EC 325 MG tablet Take 325 mg by mouth daily.     . cetirizine (ZYRTEC) 10 MG tablet Take 10 mg by mouth daily.     . Cholecalciferol (VITAMIN D PO) Take 1 tablet by mouth daily.    . cyanocobalamin 1000 MCG tablet Take 1,000 mcg by mouth daily.    . fentaNYL (DURAGESIC - DOSED MCG/HR) 25 MCG/HR patch Place 1 patch (25 mcg total) onto the skin every 3 (three) days. (Patient not taking: Reported on 02/11/2017) 5 patch 0  . glipiZIDE (  GLUCOTROL) 10 MG tablet TAKE 1 TABLET BY MOUTH TWICE A DAY  3  . levETIRAcetam (KEPPRA) 500 MG tablet TAKE 1 TABLET BY MOUTH TWICE A DAY 60 tablet 1  . lisinopril (PRINIVIL,ZESTRIL) 40 MG tablet Take 40 mg by mouth daily.     Kathleen Whitehead loperamide (IMODIUM) 2 MG capsule Take 2 mg by mouth as needed for diarrhea or loose stools.     . magnesium oxide (MAG-OX) 400 MG tablet TAKE 1 TABLET (400 MG TOTAL) BY MOUTH 2 (TWO) TIMES DAILY. 60 tablet 2  . metFORMIN (GLUCOPHAGE) 1000 MG tablet Take 1,000 mg by mouth 2 (two) times daily with a meal.     . Omega-3 Fatty Acids (FISH OIL) 1000 MG CAPS Take 1,000 mg by mouth daily.     . ondansetron (ZOFRAN) 8 MG tablet Take 1 tablet (8 mg total) by mouth every 8 (eight) hours as needed for nausea or vomiting. 30 tablet 2  . ondansetron (ZOFRAN-ODT) 4 MG disintegrating tablet Take 4 mg by mouth every 8 (eight) hours as needed for nausea or vomiting.     . Oxycodone HCl 20 MG TABS Take 1 tablet (20 mg total) by  mouth every 4 (four) hours as needed. May take with Tylenol. 120 tablet 0  . Polyethylene Glycol 3350 (MIRALAX PO) Take 17 g by mouth daily as needed for moderate constipation.     . prochlorperazine (COMPAZINE) 10 MG tablet TAKE 1 TABLET (10 MG TOTAL) BY MOUTH EVERY 6 (SIX) HOURS AS NEEDED FOR NAUSEA OR VOMITING. 30 tablet 2   No current facility-administered medications for this visit.     PHYSICAL EXAMINATION: ECOG PERFORMANCE STATUS: 3 - Symptomatic, >50% confined to bed  . Vitals:   04/04/17 1110  BP: (!) 133/54  Pulse: 83  Resp: 20  Temp: 98.9 F (37.2 C)  SpO2: 97%    Filed Weights   04/04/17 1110  Weight: 239 lb 3.2 oz (108.5 kg)   .Body mass index is 35.32 kg/m.  Constitutional: Well-developed, well-nourished, and in no distress sitting in a wheelchair.   HENT:  Head: Normocephalic and atraumatic.  Mouth/Throat: No oropharyngeal exudate. Mucosa moist. Eyes: Pupils are equal, round, and reactive to light. Conjunctivae are normal. No scleral icterus.  Neck: Normal range of motion. Neck supple. No JVD present.  Cardiovascular: Normal rate, regular rhythm and normal heart sounds.  Exam reveals no gallop and no friction rub.   No murmur heard. Pulmonary/Chest: Effort normal and breath sounds normal. No respiratory distress. No wheezes.No rales.  Abdominal: Soft. Bowel sounds are normal. No distension. There is no tenderness. There is no guarding.  Musculoskeletal: No edema or tenderness.  Neurological: Alert and oriented to person, place, and time. No cranial nerve deficit.  Skin: Skin is warm and dry. No rash noted. No erythema. No pallor.  Psychiatric: Affect and judgment normal.   LABORATORY DATA:   I have reviewed the data as listed  . CBC Latest Ref Rng & Units 12/02/2016 11/19/2016 11/09/2016  WBC 4.0 - 10.5 K/uL 5.4 5.3 4.4  Hemoglobin 12.0 - 15.0 g/dL 11.2(L) 10.8(L) 10.5(L)  Hematocrit 36.0 - 46.0 % 33.7(L) 32.2(L) 30.6(L)  Platelets 150 - 400 K/uL 74(L)  76(L) 62(L)    . CMP Latest Ref Rng & Units 12/02/2016 11/09/2016 10/26/2016  Glucose 65 - 99 mg/dL 383(H) 205(H) 305(H)  BUN 6 - 20 mg/dL 21(H) 18 19  Creatinine 0.44 - 1.00 mg/dL 1.08(H) 0.91 1.04(H)  Sodium 135 - 145 mmol/L 134(L) 137 134(L)  Potassium 3.5 - 5.1 mmol/L 4.3 3.9 4.0  Chloride 101 - 111 mmol/L 99(L) 102 101  CO2 22 - 32 mmol/L '25 25 24  '$ Calcium 8.9 - 10.3 mg/dL 9.1 9.5 8.9  Total Protein 6.5 - 8.1 g/dL 6.8 6.7 6.4(L)  Total Bilirubin 0.3 - 1.2 mg/dL 0.4 0.5 0.4  Alkaline Phos 38 - 126 U/L 69 68 66  AST 15 - 41 U/L 28 27 35  ALT 14 - 54 U/L '19 16 25     '$ RADIOGRAPHIC STUDIES: I have personally reviewed the radiological images as listed and agreed with the findings in the report. No results found.  ASSESSMENT & PLAN:   Metastatic endometrial/ovarian cancer. Patient is status post several lines of treatment as noted above. She has had limiting cytopenias even with single agent dose reduce gemcitabine which makes it less likely that she will tolerate significant additional chemotherapy. Patient currently on comfort measures only with narcotics. I have discussed with her regarding continue stay off chemotherapy and patient is agreeable with the plan. I have also discussed home hospice with the patient but she states she is not ready for that this time. I have refilled her prescription for oxycodone and Percocet today.  Return to clinic in 3 months for follow-up with labs.  Orders Placed This Encounter  Procedures  . CBC with Differential    Standing Status:   Future    Standing Expiration Date:   04/04/2018  . Comprehensive metabolic panel    Standing Status:   Future    Standing Expiration Date:   04/04/2018  . CA 125    Standing Status:   Future    Standing Expiration Date:   04/04/2018   Twana First, MD

## 2017-04-04 NOTE — Patient Instructions (Signed)
[place] at [facility] Discharge Instructions  RECOMMENDATIONS MADE BY THE CONSULTANT AND ANY TEST RESULTS WILL BE SENT TO YOUR REFERRING PHYSICIAN.  You saw Dr. Talbert Cage today for follow-up  Thank you for choosing [place] at [facility] to provide your oncology and hematology care.  To afford each patient quality time with our provider, please arrive at least 15 minutes before your scheduled appointment time.    If you have a lab appointment with the [facility] please come in thru the  Main Entrance and check in at the main information desk  You need to re-schedule your appointment should you arrive 10 or more minutes late.  We strive to give you quality time with our providers, and arriving late affects you and other patients whose appointments are after yours.  Also, if you no show three or more times for appointments you may be dismissed from the clinic at the providers discretion.     Again, thank you for choosing [facility].  Our hope is that these requests will decrease the amount of time that you wait before being seen by our physicians.       _____________________________________________________________  Should you have questions after your visit to [facility], please contact our office at [phone] between the hours of 8:30 a.m. and 4:30 p.m.  Voicemails left after 4:30 p.m. will not be returned until the following business day.  For prescription refill requests, have your pharmacy contact our office.       Resources For Cancer Patients and their Caregivers ? American Cancer Society: Can assist with transportation, wigs, general needs, runs Look Good Feel Better.        (908)444-9763 ? Cancer Care: Provides financial assistance, online support groups, medication/co-pay assistance.  1-800-813-HOPE 917-668-6277) ? [place] Assists [place] cancer patients and their  families through emotional , educational and financial support.  434-214-7643 ? [place] DSS Where to apply for food stamps, Medicaid and utility assistance. 831-451-0871 ? [facility]: Transportation to medical appointments. 606 852 4887 ? Social Security Administration: May apply for disability if have a Stage IV cancer. 213-743-9925 669-681-2583 ? [employer], Disability and Transit Services: Assists with nutrition, care and transit needs. [place] Support Programs: @10RELATIVEDAYS @ > Cancer Support Group  2nd Tuesday of the month 1pm-2pm, Journey Room  > Creative Journey  3rd Tuesday of the month 1130am-1pm, Journey Room  > Look Good Feel Better  1st Wednesday of the month 10am-12 noon, Journey Room (Call [place] to register [phone])

## 2017-05-08 ENCOUNTER — Other Ambulatory Visit (HOSPITAL_COMMUNITY): Payer: Self-pay | Admitting: Adult Health

## 2017-05-08 DIAGNOSIS — G40901 Epilepsy, unspecified, not intractable, with status epilepticus: Secondary | ICD-10-CM

## 2017-05-11 ENCOUNTER — Other Ambulatory Visit (HOSPITAL_COMMUNITY): Payer: Self-pay | Admitting: Oncology

## 2017-05-11 DIAGNOSIS — G8918 Other acute postprocedural pain: Secondary | ICD-10-CM

## 2017-05-11 DIAGNOSIS — C569 Malignant neoplasm of unspecified ovary: Secondary | ICD-10-CM

## 2017-05-11 DIAGNOSIS — M2550 Pain in unspecified joint: Secondary | ICD-10-CM

## 2017-05-11 MED ORDER — OXYCODONE-ACETAMINOPHEN 10-325 MG PO TABS: 1.0000 | ORAL_TABLET | ORAL | 0 refills | Status: DC | PRN

## 2017-06-14 ENCOUNTER — Other Ambulatory Visit (HOSPITAL_COMMUNITY): Payer: Self-pay | Admitting: Emergency Medicine

## 2017-06-14 DIAGNOSIS — C569 Malignant neoplasm of unspecified ovary: Secondary | ICD-10-CM

## 2017-06-14 MED ORDER — OXYCODONE-ACETAMINOPHEN 10-325 MG PO TABS: 1.0000 | ORAL_TABLET | ORAL | 0 refills | Status: DC | PRN

## 2017-06-27 ENCOUNTER — Other Ambulatory Visit (HOSPITAL_COMMUNITY): Payer: Self-pay | Admitting: Oncology

## 2017-07-12 ENCOUNTER — Encounter (HOSPITAL_COMMUNITY): Payer: Self-pay | Admitting: Oncology

## 2017-07-12 ENCOUNTER — Inpatient Hospital Stay (HOSPITAL_COMMUNITY): Payer: Medicare HMO | Attending: Hematology

## 2017-07-12 ENCOUNTER — Inpatient Hospital Stay (HOSPITAL_COMMUNITY): Payer: Medicare HMO | Attending: Hematology and Oncology | Admitting: Oncology

## 2017-07-12 ENCOUNTER — Other Ambulatory Visit: Payer: Self-pay

## 2017-07-12 DIAGNOSIS — I10 Essential (primary) hypertension: Secondary | ICD-10-CM | POA: Insufficient documentation

## 2017-07-12 DIAGNOSIS — G629 Polyneuropathy, unspecified: Secondary | ICD-10-CM | POA: Diagnosis not present

## 2017-07-12 DIAGNOSIS — C541 Malignant neoplasm of endometrium: Secondary | ICD-10-CM

## 2017-07-12 DIAGNOSIS — G8929 Other chronic pain: Secondary | ICD-10-CM | POA: Diagnosis not present

## 2017-07-12 DIAGNOSIS — C55 Malignant neoplasm of uterus, part unspecified: Secondary | ICD-10-CM | POA: Diagnosis not present

## 2017-07-12 DIAGNOSIS — C569 Malignant neoplasm of unspecified ovary: Secondary | ICD-10-CM | POA: Diagnosis not present

## 2017-07-12 DIAGNOSIS — G8918 Other acute postprocedural pain: Secondary | ICD-10-CM

## 2017-07-12 DIAGNOSIS — D6959 Other secondary thrombocytopenia: Secondary | ICD-10-CM | POA: Diagnosis not present

## 2017-07-12 DIAGNOSIS — M25551 Pain in right hip: Secondary | ICD-10-CM | POA: Insufficient documentation

## 2017-07-12 DIAGNOSIS — M25552 Pain in left hip: Secondary | ICD-10-CM | POA: Diagnosis not present

## 2017-07-12 DIAGNOSIS — M2550 Pain in unspecified joint: Secondary | ICD-10-CM

## 2017-07-12 LAB — CBC WITH DIFFERENTIAL/PLATELET
BASOS ABS: 0 10*3/uL (ref 0.0–0.1)
Basophils Relative: 0 %
EOS PCT: 1 %
Eosinophils Absolute: 0.1 10*3/uL (ref 0.0–0.7)
HCT: 36.8 % (ref 36.0–46.0)
Hemoglobin: 11.8 g/dL — ABNORMAL LOW (ref 12.0–15.0)
LYMPHS PCT: 29 %
Lymphs Abs: 1.8 10*3/uL (ref 0.7–4.0)
MCH: 31.1 pg (ref 26.0–34.0)
MCHC: 32.1 g/dL (ref 30.0–36.0)
MCV: 97.1 fL (ref 78.0–100.0)
MONO ABS: 0.4 10*3/uL (ref 0.1–1.0)
MONOS PCT: 6 %
Neutro Abs: 3.9 10*3/uL (ref 1.7–7.7)
Neutrophils Relative %: 64 %
Platelets: 144 10*3/uL — ABNORMAL LOW (ref 150–400)
RBC: 3.79 MIL/uL — ABNORMAL LOW (ref 3.87–5.11)
RDW: 13.6 % (ref 11.5–15.5)
WBC: 6.3 10*3/uL (ref 4.0–10.5)

## 2017-07-12 LAB — COMPREHENSIVE METABOLIC PANEL
ALBUMIN: 3.2 g/dL — AB (ref 3.5–5.0)
ALK PHOS: 48 U/L (ref 38–126)
ALT: 16 U/L (ref 14–54)
AST: 24 U/L (ref 15–41)
Anion gap: 12 (ref 5–15)
BILIRUBIN TOTAL: 0.4 mg/dL (ref 0.3–1.2)
BUN: 21 mg/dL — AB (ref 6–20)
CO2: 24 mmol/L (ref 22–32)
Calcium: 9.9 mg/dL (ref 8.9–10.3)
Chloride: 98 mmol/L — ABNORMAL LOW (ref 101–111)
Creatinine, Ser: 1.03 mg/dL — ABNORMAL HIGH (ref 0.44–1.00)
GFR calc Af Amer: >60 mL/min (ref >60)
GFR calc non Af Amer: 55 mL/min — ABNORMAL LOW (ref >60)
GLUCOSE: 160 mg/dL — AB (ref 65–99)
Potassium: 4.2 mmol/L (ref 3.5–5.1)
Sodium: 134 mmol/L — ABNORMAL LOW (ref 135–145)
TOTAL PROTEIN: 6.6 g/dL (ref 6.5–8.1)

## 2017-07-12 MED ORDER — OXYCODONE-ACETAMINOPHEN 10-325 MG PO TABS: 1.0000 | ORAL_TABLET | ORAL | 0 refills | Status: DC | PRN

## 2017-07-12 NOTE — Progress Notes (Signed)
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 [name] and associates, pathology could not be certain that these were not 2 separate primaries rather than one metastatic process from the uterus to the ovary.      08/15/2013 - 12/06/2013 Chemotherapy    Carboplatin/Taxol with dose reduction of 50% and Taxol due to grade 2+ peripheral neuropathy and gabapentin induced nightmare/hallucinations. 5 out of 6 cycles given with the final cycle being cancelled secondary to significant thrombocytopenia/intolerance.      09/10/2013 - 10/17/2013 Radiation Therapy    5040 cGy delivered by EBRT followed by intravaginal brachytherapy.      04/30/2014 PET scan    PET scan consistent with recurrent disease of either ovarian cancer or endometrial cancer.      06/03/2014 - 07/15/2014 Radiation Therapy    5040 cGy over 28 fractions delivered to the low periaortic area lymph node either metastatic endometrial or metastatic or ovarian cancer.      07/19/2014 Imaging    Imaging concerning for new mesenteric disease and subdiaphragmatic disease.      08/05/2014 PET scan    No evidence of progression of disease.      08/05/2014 Remission    Negative PET scan.      06/11/2016 PET scan    1. Hypermetabolic aortocaval and right common iliac adenopathy, maximum SUV 17.1, compatible with malignancy. 2. Calcified left mesenteric mass has only low-grade activity, maximum SUV 5.3, merit surveillance. 3. There several small calcifications along the omentum which are not currently metabolically active. 4. Along the scarring in operative site from prior laparotomy there some faintly accentuated  metabolic activity which is probably related to the original wound and unlikely to be from tumor. 5. Coronary, aortic arch, and branch vessel atherosclerotic vascular disease.      06/11/2016 Relapse/Recurrence    PET scan with aortocaval & right iliac adenopathy consistent with malignancy.       08/03/2016 -  Chemotherapy    Carboplatin Day 1/Gemzar Days 1&8 every 21 days       08/10/2016 Treatment Plan Change    Day 8 cycle 1 cancelled due to cytopenia      09/07/2016 Treatment Plan Change    Treatment delayed x 1 week due to thrombocytopenia.  Hypomagnesemia noted and replaced IV.      09/08/2016 - 09/12/2016 Hospital Admission    Admit date: 09/08/2016  Admission diagnosis: Seizure Additional comments: Intubated on 3/7 for airway protection and extubated on 09/09/2016.  Hospital course complicated by pancytopenia secondary to systemic chemotherapy.      09/08/2016 Imaging    EEG- This sedated EEG is abnormal due to diffuse slowing of the waking background.  Clinical Correlation of the above findings indicates diffuse cerebral dysfunction that is non-specific in etiology and can be seen with hypoxic/ischemic injury, toxic/metabolic encephalopathies, neurodegenerative disorders, or medication effect.  The absence of epileptiform discharges does not rule out a clinical diagnosis of epilepsy.  Clinical correlation is advised.      09/12/2016 Imaging    CT head- No metastatic disease or acute intracranial abnormality. Normal for age CT appearance of the brain.      09/21/2016 Treatment Plan  Change    Carboplatin deleted from treatment plan.  Will pursue single-agent Gemcitabine at reduced dose.      11/19/2016 Relapse/Recurrence    Diagnosis Lymph node, needle/core biopsy, left supraclavicular METASTATIC ADENOCARCINOMA, CONSISTENT WITH GYNECOLOGICAL PRIMARY.       INTERVAL HISTORY: Patient is here for follow-up.  She is currently on comfort measures only.  Her last chemotherapy was  in April 2018.  She unfortunately had issues with cytopenias and poor functional status.  she continues to complain of persistent neuropathy in her hands and feet.  She also has chronic pains in hips and hands.  She is currently taking Percocet 10/325 with relief.  She notes to not take her oxycodone regularly.  Her appetite is 75% and energy 75% she has occasional hot flashes.  She is chronic fatigue.  Bilateral leg swelling is better. She had Foundation 1 testing on the lymph node which shows MSI stable status. She denies any chest pain, shortness breath, abdominal pain or bloating.  REVIEW OF SYSTEMS:    10 Point review of systems of done and is negative except as noted above.  . Past Medical History:  Diagnosis Date  . Anemia   . Asthma   . Chemotherapy induced neutropenia ([place])   . Chemotherapy induced thrombocytopenia   . Diabetes mellitus without complication ([place])   . Endometrial cancer ([place])   . Hot flashes   . Hypertension   . Malignant neoplasm ([place])   . 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 Obesity   . Ovarian cancer ([place])   . Pancytopenia ([place])   . Peripheral artery disease ([place])   . Peripheral neuropathy   . Pneumonia 2015  . Port-A-Cath in place   . Seizures ([place]) 09/08/2016   ?? due to low magnesium    . Past Surgical History:  Procedure Laterality Date  . ABDOMINAL HYSTERECTOMY  2014    . Social History   Tobacco Use  . Smoking status: Never Smoker  . Smokeless tobacco: Never Used  Substance Use Topics  . Alcohol use: No  . Drug use: No    ALLERGIES:  is allergic to diphenhydramine; iodinated diagnostic agents; duloxetine hcl; ibuprofen; and penicillins.  MEDICATIONS:  Current Outpatient Medications  Medication Sig Dispense Refill  . acetaminophen (TYLENOL) 500 MG tablet Take 500 mg by mouth every 6 (six) hours as needed.    Marland Kitchen aspirin EC 325 MG tablet Take 325 mg by mouth daily.     . cetirizine (ZYRTEC) 10 MG tablet Take 10 mg by mouth  daily.     . Cholecalciferol (VITAMIN D PO) Take 1 tablet by mouth daily.    . cyanocobalamin 1000 MCG tablet Take 1,000 mcg by mouth daily.    Marland Kitchen glipiZIDE (GLUCOTROL) 10 MG tablet TAKE 1 TABLET BY MOUTH TWICE A DAY  3  . levETIRAcetam (KEPPRA) 500 MG tablet TAKE 1 TABLET BY MOUTH TWICE A DAY 60 tablet 1  . lisinopril (PRINIVIL,ZESTRIL) 40 MG tablet Take 40 mg by mouth daily.     Marland Kitchen loperamide (IMODIUM) 2 MG capsule Take 2 mg by mouth as needed for diarrhea or loose stools.     . magnesium oxide (MAG-OX) 400 MG tablet TAKE 1 TABLET (400 MG TOTAL) BY MOUTH 2 (TWO) TIMES DAILY. 60 tablet 2  . metFORMIN (GLUCOPHAGE) 1000 MG tablet Take 1,000 mg by mouth 2 (two) times daily with a meal.     . Omega-3 Fatty Acids (FISH OIL) 1000 MG CAPS Take 1,000 mg  by mouth daily.     . ondansetron (ZOFRAN) 8 MG tablet Take 1 tablet (8 mg total) by mouth every 8 (eight) hours as needed for nausea or vomiting. 30 tablet 2  . ondansetron (ZOFRAN-ODT) 4 MG disintegrating tablet Take 4 mg by mouth every 8 (eight) hours as needed for nausea or vomiting.     . Oxycodone HCl 20 MG TABS Take 1 tablet by mouth every 4 (four) hours as needed.  0  . [START ON 07/15/2017] oxyCODONE-acetaminophen (PERCOCET) 10-325 MG tablet Take 1 tablet by mouth every 4 (four) hours as needed for pain. 180 tablet 0  . Polyethylene Glycol 3350 (MIRALAX PO) Take 17 g by mouth daily as needed for moderate constipation.     . prochlorperazine (COMPAZINE) 10 MG tablet TAKE 1 TABLET (10 MG TOTAL) BY MOUTH EVERY 6 (SIX) HOURS AS NEEDED FOR NAUSEA OR VOMITING. 30 tablet 2   No current facility-administered medications for this visit.     PHYSICAL EXAMINATION: ECOG PERFORMANCE STATUS: 3 - Symptomatic, >50% confined to bed  . Vitals:   07/12/17 1206  BP: (!) 151/53  Pulse: 72  Resp: 16  Temp: 98.4 F (36.9 C)  SpO2: 98%    Filed Weights   07/12/17 1206  Weight: 236 lb (107 kg)   .Body mass index is 34.85 kg/m.  Constitutional:  Well-developed, well-nourished, and in no distress sitting in a wheelchair.   HENT:  Head: Normocephalic and atraumatic.  Mouth/Throat: No oropharyngeal exudate. Mucosa moist. Eyes: Pupils are equal, round, and reactive to light. Conjunctivae are normal. No scleral icterus.  Neck: Normal range of motion. Neck supple. No JVD present.  Cardiovascular: Normal rate, regular rhythm and normal heart sounds.  Exam reveals no gallop and no friction rub.   No murmur heard. Pulmonary/Chest: Effort normal and breath sounds normal. No respiratory distress. No wheezes.No rales.  Abdominal: Soft. Bowel sounds are normal. No distension. There is no tenderness. There is no guarding.  Musculoskeletal: No edema or tenderness.  Neurological: Alert and oriented to person, place, and time. No cranial nerve deficit.  Skin: Skin is warm and dry. No rash noted. No erythema. No pallor.  Psychiatric: Affect and judgment normal.   LABORATORY DATA:   I have reviewed the data as listed  . CBC Latest Ref Rng & Units 07/12/2017 12/02/2016 11/19/2016  WBC 4.0 - 10.5 K/uL 6.3 5.4 5.3  Hemoglobin 12.0 - 15.0 g/dL 11.8(L) 11.2(L) 10.8(L)  Hematocrit 36.0 - 46.0 % 36.8 33.7(L) 32.2(L)  Platelets 150 - 400 K/uL 144(L) 74(L) 76(L)    . CMP Latest Ref Rng & Units 07/12/2017 12/02/2016 11/09/2016  Glucose 65 - 99 mg/dL 160(H) 383(H) 205(H)  BUN 6 - 20 mg/dL 21(H) 21(H) 18  Creatinine 0.44 - 1.00 mg/dL 1.03(H) 1.08(H) 0.91  Sodium 135 - 145 mmol/L 134(L) 134(L) 137  Potassium 3.5 - 5.1 mmol/L 4.2 4.3 3.9  Chloride 101 - 111 mmol/L 98(L) 99(L) 102  CO2 22 - 32 mmol/L _0 Calcium 8.9 - 10.3 mg/dL 9.9 9.1 9.5  Total Protein 6.5 - 8.1 g/dL 6.6 6.8 6.7  Total Bilirubin 0.3 - 1.2 mg/dL 0.4 0.4 0.5  Alkaline Phos 38 - 126 U/L 48 69 68  AST 15 - 41 U/L _1 ALT 14 - 54 U/L _2 RADIOGRAPHIC STUDIES: I have personally reviewed the radiological images as listed and agreed with the findings in the report. No  results found.  ASSESSMENT &  PLAN:   Metastatic endometrial/ovarian cancer. Patient is status post several lines of treatment as noted above. She has had limiting cytopenias even with single agent dose reduce gemcitabine which makes it less likely that she will tolerate significant additional chemotherapy. Today labs pretty good.  CBC/CMET look stable. Patient currently on comfort measures only with  pain management.  It was discussed at previous visits to continue to stay off chemotherapy.  Home hospice was discussed  but she is not ready for their services at this time.  I have refilled her prescription for Percocet today.  Patient is to return to clinic in 3 months for follow-up with labs  Orders Placed This Encounter  Procedures  . CBC with Differential    Standing Status:   Future    Number of Occurrences:   1    Standing Expiration Date:   07/12/2018  Faythe Casa, NP 07/12/2017 4:03 PM

## 2017-07-13 LAB — CA 125: Cancer Antigen (CA) 125: 108.1 U/mL — ABNORMAL HIGH (ref 0.0–38.1)

## 2017-07-22 ENCOUNTER — Other Ambulatory Visit (HOSPITAL_COMMUNITY): Payer: Self-pay | Admitting: Adult Health

## 2017-07-22 DIAGNOSIS — G40901 Epilepsy, unspecified, not intractable, with status epilepticus: Secondary | ICD-10-CM

## 2017-08-02 ENCOUNTER — Other Ambulatory Visit (HOSPITAL_COMMUNITY): Payer: Self-pay | Admitting: Adult Health

## 2017-08-02 ENCOUNTER — Encounter (HOSPITAL_COMMUNITY): Payer: Self-pay | Admitting: Adult Health

## 2017-08-02 DIAGNOSIS — C541 Malignant neoplasm of endometrium: Secondary | ICD-10-CM

## 2017-08-02 DIAGNOSIS — C569 Malignant neoplasm of unspecified ovary: Secondary | ICD-10-CM

## 2017-08-02 DIAGNOSIS — G893 Neoplasm related pain (acute) (chronic): Secondary | ICD-10-CM

## 2017-08-02 MED ORDER — OXYCODONE HCL 20 MG PO TABS
1.0000 | ORAL_TABLET | ORAL | 0 refills | Status: DC | PRN
Start: 2017-08-02 — End: 2017-09-14

## 2017-08-02 NOTE — Progress Notes (Signed)
Patient called cancer center requesting refill of Oxycodone 20 mg. Note: She uses Percocet 10/325 for moderate pain and uses Oxycodone 20 mg for severe pain.   [place] Controlled Substance Reporting System reviewed and refill is appropriate on or after 08/02/17. Medication e-scribed to her pharmacy ([facility]) using [employer]'s 2-step verification process.    NCCSRS reviewed:     Mike Craze, NP [place] [phone]

## 2017-08-17 ENCOUNTER — Encounter (HOSPITAL_COMMUNITY): Payer: Self-pay | Admitting: Adult Health

## 2017-08-17 ENCOUNTER — Other Ambulatory Visit (HOSPITAL_COMMUNITY): Payer: Self-pay | Admitting: Adult Health

## 2017-08-17 DIAGNOSIS — C569 Malignant neoplasm of unspecified ovary: Secondary | ICD-10-CM

## 2017-08-17 MED ORDER — OXYCODONE-ACETAMINOPHEN 10-325 MG PO TABS: 1.0000 | ORAL_TABLET | ORAL | 0 refills | Status: DC | PRN

## 2017-08-17 NOTE — Progress Notes (Signed)
Patient called cancer center requesting refill of Percocet 10/325 (of note: she takes Percocet for mild-mod pain; she takes oxycodone 20 mg for severe pain).   [place] Controlled Substance Reporting System reviewed and refill is appropriate on or after 08/17/17. Medication e-scribed to her pharmacy ([facility]) using [employer] 2-step verification process.    NCCSRS reviewed:     Mike Craze, NP [place] [phone]

## 2017-09-12 ENCOUNTER — Telehealth (HOSPITAL_COMMUNITY): Payer: Self-pay

## 2017-09-12 NOTE — Telephone Encounter (Signed)
Patient called stating she has been trying to get in touch with her PCP for an earache, sore throat, sinus drainage that is light green in color.  The earache is also causing left shoulder discomfort and into her collar bone.  Denied fevers and chills.  She has not tried any OTC medications to help with the drainage, only using pain medications to help with her shoulder and collar bone discomfort.  She denied SOB, jaw pain, and arm pain.  Instructed the patient since she is not under current chemotherapy treatment (last treatment 10/19/2016) and on follow up appointments only she needs to get in touch with her PCP for the sinus drainage.  Instructed the patient if the pain worsens or any SOB, jaw pain, or arm pain she needs to report to the emergency room for evaluation.  Patient verbalized understanding.

## 2017-09-14 ENCOUNTER — Other Ambulatory Visit (HOSPITAL_COMMUNITY): Payer: Self-pay | Admitting: Adult Health

## 2017-09-14 ENCOUNTER — Encounter (HOSPITAL_COMMUNITY): Payer: Self-pay | Admitting: Adult Health

## 2017-09-14 DIAGNOSIS — C541 Malignant neoplasm of endometrium: Secondary | ICD-10-CM

## 2017-09-14 DIAGNOSIS — C569 Malignant neoplasm of unspecified ovary: Secondary | ICD-10-CM

## 2017-09-14 DIAGNOSIS — G893 Neoplasm related pain (acute) (chronic): Secondary | ICD-10-CM

## 2017-09-14 MED ORDER — OXYCODONE HCL 20 MG PO TABS: 1.0000 | ORAL_TABLET | ORAL | 0 refills | Status: DC | PRN

## 2017-09-14 MED ORDER — OXYCODONE-ACETAMINOPHEN 10-325 MG PO TABS: 1.0000 | ORAL_TABLET | ORAL | 0 refills | Status: DC | PRN

## 2017-09-14 NOTE — Progress Notes (Signed)
Patient called cancer center requesting refill of Oxycodone 20 mg (severe pain) & Percocet 10/325 (moderate pain).   [place] Controlled Substance Reporting System reviewed and refill is appropriate on or after 09/14/17 for both prescriptions. Medication e-scribed to her pharmacy ([facility]) using [employer] 2-step verification process.    NCCSRS reviewed:     Mike Craze, NP [place] [phone]

## 2017-09-20 ENCOUNTER — Other Ambulatory Visit (HOSPITAL_COMMUNITY): Payer: Self-pay | Admitting: Adult Health

## 2017-09-20 DIAGNOSIS — G40901 Epilepsy, unspecified, not intractable, with status epilepticus: Secondary | ICD-10-CM

## 2017-10-04 ENCOUNTER — Other Ambulatory Visit (HOSPITAL_COMMUNITY): Payer: Self-pay | Admitting: Adult Health

## 2017-10-07 ENCOUNTER — Other Ambulatory Visit (HOSPITAL_COMMUNITY): Payer: Self-pay | Admitting: *Deleted

## 2017-10-07 DIAGNOSIS — C569 Malignant neoplasm of unspecified ovary: Secondary | ICD-10-CM

## 2017-10-10 ENCOUNTER — Encounter (HOSPITAL_COMMUNITY): Payer: Self-pay | Admitting: Internal Medicine

## 2017-10-10 ENCOUNTER — Inpatient Hospital Stay (HOSPITAL_COMMUNITY): Payer: Medicare HMO

## 2017-10-10 ENCOUNTER — Inpatient Hospital Stay (HOSPITAL_COMMUNITY): Payer: Medicare HMO | Attending: Internal Medicine | Admitting: Internal Medicine

## 2017-10-10 ENCOUNTER — Other Ambulatory Visit: Payer: Self-pay

## 2017-10-10 VITALS — BP 150/56 | HR 105 | Temp 98.4°F | Resp 18 | Ht 69.0 in | Wt 235.0 lb

## 2017-10-10 DIAGNOSIS — Z79899 Other long term (current) drug therapy: Secondary | ICD-10-CM | POA: Insufficient documentation

## 2017-10-10 DIAGNOSIS — I10 Essential (primary) hypertension: Secondary | ICD-10-CM | POA: Insufficient documentation

## 2017-10-10 DIAGNOSIS — C770 Secondary and unspecified malignant neoplasm of lymph nodes of head, face and neck: Secondary | ICD-10-CM

## 2017-10-10 DIAGNOSIS — C541 Malignant neoplasm of endometrium: Secondary | ICD-10-CM | POA: Diagnosis not present

## 2017-10-10 DIAGNOSIS — C569 Malignant neoplasm of unspecified ovary: Secondary | ICD-10-CM | POA: Diagnosis not present

## 2017-10-10 LAB — COMPREHENSIVE METABOLIC PANEL
ALT: 15 U/L (ref 14–54)
AST: 23 U/L (ref 15–41)
Albumin: 3.2 g/dL — ABNORMAL LOW (ref 3.5–5.0)
Alkaline Phosphatase: 58 U/L (ref 38–126)
Anion gap: 13 (ref 5–15)
BUN: 26 mg/dL — AB (ref 6–20)
CHLORIDE: 100 mmol/L — AB (ref 101–111)
CO2: 23 mmol/L (ref 22–32)
CREATININE: 1.15 mg/dL — AB (ref 0.44–1.00)
Calcium: 10.3 mg/dL (ref 8.9–10.3)
GFR calc Af Amer: 55 mL/min — ABNORMAL LOW (ref >60)
GFR, EST NON AFRICAN AMERICAN: 48 mL/min — AB (ref >60)
Glucose, Bld: 144 mg/dL — ABNORMAL HIGH (ref 65–99)
Potassium: 4.4 mmol/L (ref 3.5–5.1)
SODIUM: 136 mmol/L (ref 135–145)
Total Bilirubin: 0.7 mg/dL (ref 0.3–1.2)
Total Protein: 7.4 g/dL (ref 6.5–8.1)

## 2017-10-10 LAB — CBC WITH DIFFERENTIAL/PLATELET
BASOS ABS: 0 10*3/uL (ref 0.0–0.1)
Basophils Relative: 0 %
EOS PCT: 2 %
Eosinophils Absolute: 0.1 10*3/uL (ref 0.0–0.7)
HCT: 34.7 % — ABNORMAL LOW (ref 36.0–46.0)
Hemoglobin: 11.4 g/dL — ABNORMAL LOW (ref 12.0–15.0)
LYMPHS PCT: 30 %
Lymphs Abs: 1.8 10*3/uL (ref 0.7–4.0)
MCH: 31.8 pg (ref 26.0–34.0)
MCHC: 32.9 g/dL (ref 30.0–36.0)
MCV: 96.7 fL (ref 78.0–100.0)
Monocytes Absolute: 0.5 10*3/uL (ref 0.1–1.0)
Monocytes Relative: 8 %
NEUTROS PCT: 60 %
Neutro Abs: 3.7 10*3/uL (ref 1.7–7.7)
PLATELETS: 149 10*3/uL — AB (ref 150–400)
RBC: 3.59 MIL/uL — AB (ref 3.87–5.11)
RDW: 12.6 % (ref 11.5–15.5)
WBC: 6.2 10*3/uL (ref 4.0–10.5)

## 2017-10-10 NOTE — Progress Notes (Signed)
Diagnosis Malignant neoplasm of endometrium ([place]) - Plan: CT Chest W Contrast, CT Abdomen Pelvis W Contrast, CBC with Differential/Platelet, Comprehensive metabolic panel, Lactate dehydrogenase, CA 125, NM PET Image Restag (PS) Skull Base To Thigh  Malignant neoplasm of ovary, unspecified laterality ([place]) - Plan: CT Chest W Contrast, CT Abdomen Pelvis W Contrast, CBC with Differential/Platelet, Comprehensive metabolic panel, Lactate dehydrogenase, CA 125, NM PET Image Restag (PS) Skull Base To Thigh  Staging Cancer Staging No matching staging information was found for the patient.  Assessment and plan:  1.  Metastatic endometrial/ovarian cancer.  Patient was previously followed by Dr. Talbert Cage.  Patient is status post several lines of treatment as noted below.  Last treatment was single agent dose reduced gemcitabine and she developed significant cytopenias so it was felt she would be less likely to ttolerate additional chemotherapy.  She reportedly was on comfort measures with pain management.  She was given the option of hospice referral but continues to report she does not desire hospice evaluation at the present time.  I discussed with her if she desires ongoing follow-up since it has been a year since her last imaging will be reasonable for her to undergo scans in July 2019 for further evaluation.  She reports she is unable to tolerate oral contrast.  Will attempt to set her up for PET scan for further evaluation and she will return to clinic in July 2019 for follow-up to discuss results.  2.  Hypertension.  BP is 150/56.  Follow-up with PCP.    Current Status: Patient is seen today for follow-up.  When questioned she reports she still does not desire hospice referral.  She has had recent pain medications p.o. by nurse practitioner Renato Battles.    Malignant neoplasm of ovary ([place])   04/30/2013 Initial Diagnosis    Ovarian cancer/uterine cancer status post TAH-BSO by Dr. [name] and associates,  pathology could not be certain that these were not 2 separate primaries rather than one metastatic process from the uterus to the ovary.      08/15/2013 - 12/06/2013 Chemotherapy    Carboplatin/Taxol with dose reduction of 50% and Taxol due to grade 2+ peripheral neuropathy and gabapentin induced nightmare/hallucinations. 5 out of 6 cycles given with the final cycle being cancelled secondary to significant thrombocytopenia/intolerance.      09/10/2013 - 10/17/2013 Radiation Therapy    5040 cGy delivered by EBRT followed by intravaginal brachytherapy.      04/30/2014 PET scan    PET scan consistent with recurrent disease of either ovarian cancer or endometrial cancer.      06/03/2014 - 07/15/2014 Radiation Therapy    5040 cGy over 28 fractions delivered to the low periaortic area lymph node either metastatic endometrial or metastatic or ovarian cancer.      07/19/2014 Imaging    Imaging concerning for new mesenteric disease and subdiaphragmatic disease.      08/05/2014 PET scan    No evidence of progression of disease.      08/05/2014 Remission    Negative PET scan.      06/11/2016 PET scan    1. Hypermetabolic aortocaval and right common iliac adenopathy, maximum SUV 17.1, compatible with malignancy. 2. Calcified left mesenteric mass has only low-grade activity, maximum SUV 5.3, merit surveillance. 3. There several small calcifications along the omentum which are not currently metabolically active. 4. Along the scarring in operative site from prior laparotomy there some faintly accentuated metabolic activity which is probably related to the original wound and  unlikely to be from tumor. 5. Coronary, aortic arch, and branch vessel atherosclerotic vascular disease.      06/11/2016 Relapse/Recurrence    PET scan with aortocaval & right iliac adenopathy consistent with malignancy.       08/03/2016 -  Chemotherapy    Carboplatin Day 1/Gemzar Days 1&8 every 21 days       08/10/2016  Treatment Plan Change    Day 8 cycle 1 cancelled due to cytopenia      09/07/2016 Treatment Plan Change    Treatment delayed x 1 week due to thrombocytopenia.  Hypomagnesemia noted and replaced IV.      09/08/2016 - 09/12/2016 Hospital Admission    Admit date: 09/08/2016  Admission diagnosis: Seizure Additional comments: Intubated on 3/7 for airway protection and extubated on 09/09/2016.  Hospital course complicated by pancytopenia secondary to systemic chemotherapy.      09/08/2016 Imaging    EEG- This sedated EEG is abnormal due to diffuse slowing of the waking background.  Clinical Correlation of the above findings indicates diffuse cerebral dysfunction that is non-specific in etiology and can be seen with hypoxic/ischemic injury, toxic/metabolic encephalopathies, neurodegenerative disorders, or medication effect.  The absence of epileptiform discharges does not rule out a clinical diagnosis of epilepsy.  Clinical correlation is advised.      09/12/2016 Imaging    CT head- No metastatic disease or acute intracranial abnormality. Normal for age CT appearance of the brain.      09/21/2016 Treatment Plan Change    Carboplatin deleted from treatment plan.  Will pursue single-agent Gemcitabine at reduced dose.      11/19/2016 Relapse/Recurrence    Diagnosis Lymph node, needle/core biopsy, left supraclavicular METASTATIC ADENOCARCINOMA, CONSISTENT WITH GYNECOLOGICAL PRIMARY.        Problem List Patient Active Problem List   Diagnosis Date Noted  . Goals of care, counseling/discussion [Z71.89] 09/21/2016  . Encounter for orogastric (OG) tube placement [Z46.59]   . Encounter for intubation [Z01.818]   . Dyspnea [R06.00]   . Status epilepticus ([place]) [G40.901] 09/08/2016  . Acute respiratory failure ([place]) [J96.00]   . 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 Axillary lump [R22.30] 12/08/2015  . Blush [IMO0001] 03/11/2015  . Acquired pancytopenia ([place])  [P53.614] 12/09/2014  . Presence of other vascular implants and grafts [Z95.828] 04/18/2014  . Drug-induced low platelet count [D69.59, T50.905A] 12/04/2013  . 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 Chemotherapy-induced neutropenia ([place]) [D70.1, T45.1X5A] 08/24/2013  . Malignant neoplasm of endometrium ([place]) [C54.1] 06/11/2013  . Obesity (BMI 30-39.9) [E66.9] 05/07/2013  . Abdominal pain [R10.9] 04/30/2013  . Acute blood loss anemia [D62] 04/30/2013  . Type 2 diabetes mellitus ([place]) [E11.9] 04/30/2013  . BP (high blood pressure) [I10] 04/30/2013  . Malignant neoplasm of ovary ([place]) [C56.9] 04/30/2013    Past Medical History Past Medical History:  Diagnosis Date  . Anemia   . Asthma   . Chemotherapy induced neutropenia ([place])   . Chemotherapy induced thrombocytopenia   . Diabetes mellitus without complication ([place])   . Endometrial cancer ([place])   . Hot flashes   . Hypertension   . Malignant neoplasm ([place])   . Malignant neoplasm of ovary ([place]) 04/30/2013  . Obesity   . Ovarian cancer ([place])   . Pancytopenia ([place])   . Peripheral artery disease ([place])   . Peripheral neuropathy   . Pneumonia 2015  . Port-A-Cath in place   . Seizures ([place]) 09/08/2016   ?? due to low magnesium    Past  Surgical History Past Surgical History:  Procedure Laterality Date  . ABDOMINAL HYSTERECTOMY  2014    Family History Family History  Problem Relation Age of Onset  . Heart failure Mother   . Stroke Sister   . Cancer Sister   . Diabetes Sister   . Leukemia Sister   . Diabetes Brother      Social History  reports that she has never smoked. She has never used smokeless tobacco. She reports that she does not drink alcohol or use drugs.  Medications  Current Outpatient Medications:  .  acetaminophen (TYLENOL) 500 MG tablet, Take 500 mg by mouth every 6 (six) hours as needed., Disp: , Rfl:  .  aspirin EC 325 MG tablet, Take 325 mg by mouth daily. , Disp: , Rfl:  .  cetirizine  (ZYRTEC) 10 MG tablet, Take 10 mg by mouth daily. , Disp: , Rfl:  .  Cholecalciferol (VITAMIN D PO), Take 1 tablet by mouth daily., Disp: , Rfl:  .  cyanocobalamin 1000 MCG tablet, Take 1,000 mcg by mouth daily., Disp: , Rfl:  .  glipiZIDE (GLUCOTROL) 10 MG tablet, TAKE 1 TABLET BY MOUTH TWICE A DAY, Disp: , Rfl: 3 .  levETIRAcetam (KEPPRA) 500 MG tablet, TAKE 1 TABLET BY MOUTH TWICE A DAY, Disp: 60 tablet, Rfl: 1 .  lisinopril (PRINIVIL,ZESTRIL) 40 MG tablet, Take 40 mg by mouth daily. , Disp: , Rfl:  .  loperamide (IMODIUM) 2 MG capsule, Take 2 mg by mouth as needed for diarrhea or loose stools. , Disp: , Rfl:  .  magnesium oxide (MAG-OX) 400 MG tablet, TAKE 1 TABLET BY MOUTH TWICE A DAY, Disp: 60 tablet, Rfl: 2 .  Magnesium Oxide 400 (240 Mg) MG TABS, Take 1 tablet by mouth 2 (two) times daily., Disp: , Rfl: 2 .  metFORMIN (GLUCOPHAGE) 1000 MG tablet, Take 1,000 mg by mouth 2 (two) times daily with a meal. , Disp: , Rfl:  .  Omega-3 Fatty Acids (FISH OIL) 1000 MG CAPS, Take 1,000 mg by mouth daily. , Disp: , Rfl:  .  ondansetron (ZOFRAN) 8 MG tablet, Take 1 tablet (8 mg total) by mouth every 8 (eight) hours as needed for nausea or vomiting., Disp: 30 tablet, Rfl: 2 .  ondansetron (ZOFRAN-ODT) 4 MG disintegrating tablet, Take 4 mg by mouth every 8 (eight) hours as needed for nausea or vomiting. , Disp: , Rfl:  .  Oxycodone HCl 20 MG TABS, Take 1 tablet (20 mg total) by mouth every 4 (four) hours as needed. For severe pain., Disp: 90 tablet, Rfl: 0 .  oxyCODONE-acetaminophen (PERCOCET) 10-325 MG tablet, Take 1 tablet by mouth every 4 (four) hours as needed for pain., Disp: 180 tablet, Rfl: 0 .  Polyethylene Glycol 3350 (MIRALAX PO), Take 17 g by mouth daily as needed for moderate constipation. , Disp: , Rfl:  .  prochlorperazine (COMPAZINE) 10 MG tablet, TAKE 1 TABLET (10 MG TOTAL) BY MOUTH EVERY 6 (SIX) HOURS AS NEEDED FOR NAUSEA OR VOMITING., Disp: 30 tablet, Rfl: 2  Allergies Diphenhydramine;  Iodinated diagnostic agents; Duloxetine hcl; Ibuprofen; and Penicillins  Review of Systems Review of Systems - Oncology ROS as per HPI otherwise 12 point ROS is negative other than neuropathy, LE swelling, constipation.     Physical Exam  Vitals Wt Readings from Last 3 Encounters:  10/10/17 235 lb (106.6 kg)  07/12/17 236 lb (107 kg)  04/04/17 239 lb 3.2 oz (108.5 kg)   Temp Readings from Last 3 Encounters:  10/10/17 98.4 F (36.9 C) (Oral)  07/12/17 98.4 F (36.9 C) (Oral)  04/04/17 98.9 F (37.2 C) (Oral)   BP Readings from Last 3 Encounters:  10/10/17 (!) 150/56  07/12/17 (!) 151/53  04/04/17 (!) 133/54   Pulse Readings from Last 3 Encounters:  10/10/17 (!) 105  07/12/17 72  04/04/17 83   Constitutional: Well-developed, well-nourished, and in no distress.   HENT: Head: Normocephalic and atraumatic.  Mouth/Throat: No oropharyngeal exudate. Mucosa moist. Eyes: Pupils are equal, round, and reactive to light. Conjunctivae are normal. No scleral icterus.  Neck: Normal range of motion. Neck supple. No JVD present.  Cardiovascular: Normal rate, regular rhythm and normal heart sounds.  Exam reveals no gallop and no friction rub.   No murmur heard. Pulmonary/Chest: Effort normal and breath sounds normal. No respiratory distress. No wheezes.No rales.  Abdominal: Soft. Bowel sounds are normal. No distension. There is no tenderness. There is no guarding.  Musculoskeletal: +1 LE edema bilaterally.  Lymphadenopathy: Tender in the left supraclavicular area reportedly due to prior fracture.  No axillary or cervical adenopathy noted.   Neurological: Alert and oriented to person, place, and time. No cranial nerve deficit.  Skin: Skin is warm and dry. No rash noted. No erythema. No pallor.  Psychiatric: Affect and judgment normal.   Labs Appointment on 10/10/2017  Component Date Value Ref Range Status  . WBC 10/10/2017 6.2  4.0 - 10.5 K/uL Final  . RBC 10/10/2017 3.59* 3.87 - 5.11  MIL/uL Final  . Hemoglobin 10/10/2017 11.4* 12.0 - 15.0 g/dL Final  . HCT 10/10/2017 34.7* 36.0 - 46.0 % Final  . MCV 10/10/2017 96.7  78.0 - 100.0 fL Final  . MCH 10/10/2017 31.8  26.0 - 34.0 pg Final  . MCHC 10/10/2017 32.9  30.0 - 36.0 g/dL Final  . RDW 10/10/2017 12.6  11.5 - 15.5 % Final  . Platelets 10/10/2017 149* 150 - 400 K/uL Final  . Neutrophils Relative % 10/10/2017 60  % Final  . Neutro Abs 10/10/2017 3.7  1.7 - 7.7 K/uL Final  . Lymphocytes Relative 10/10/2017 30  % Final  . Lymphs Abs 10/10/2017 1.8  0.7 - 4.0 K/uL Final  . Monocytes Relative 10/10/2017 8  % Final  . Monocytes Absolute 10/10/2017 0.5  0.1 - 1.0 K/uL Final  . Eosinophils Relative 10/10/2017 2  % Final  . Eosinophils Absolute 10/10/2017 0.1  0.0 - 0.7 K/uL Final  . Basophils Relative 10/10/2017 0  % Final  . Basophils Absolute 10/10/2017 0.0  0.0 - 0.1 K/uL Final   Performed at [facility], [address] [place], [place] [address]  . Sodium 10/10/2017 136  135 - 145 mmol/L Final  . Potassium 10/10/2017 4.4  3.5 - 5.1 mmol/L Final  . Chloride 10/10/2017 100* 101 - 111 mmol/L Final  . CO2 10/10/2017 23  22 - 32 mmol/L Final  . Glucose, Bld 10/10/2017 144* 65 - 99 mg/dL Final  . BUN 10/10/2017 26* 6 - 20 mg/dL Final  . Creatinine, Ser 10/10/2017 1.15* 0.44 - 1.00 mg/dL Final  . Calcium 10/10/2017 10.3  8.9 - 10.3 mg/dL Final  . Total Protein 10/10/2017 7.4  6.5 - 8.1 g/dL Final  . Albumin 10/10/2017 3.2* 3.5 - 5.0 g/dL Final  . AST 10/10/2017 23  15 - 41 U/L Final  . ALT 10/10/2017 15  14 - 54 U/L Final  . Alkaline Phosphatase 10/10/2017 58  38 - 126 U/L Final  . Total Bilirubin 10/10/2017 0.7  0.3 -  1.2 mg/dL Final  . GFR calc non Af Amer 10/10/2017 48* >60 mL/min Final  . GFR calc Af Amer 10/10/2017 55* >60 mL/min Final   Comment: (NOTE) The eGFR has been calculated using the CKD EPI equation. This calculation has not been validated in all clinical situations. eGFR's persistently <60 mL/min signify  possible Chronic Kidney Disease.   Georgiann Hahn gap 10/10/2017 13  5 - 15 Final   Performed at [facility], [address] [place], [place] [address]     Pathology Orders Placed This Encounter  Procedures  . CT Chest W Contrast    Standing Status:   Future    Standing Expiration Date:   10/10/2018    Order Specific Question:   If indicated for the ordered procedure, I authorize the administration of contrast media per Radiology protocol    Answer:   Yes    Order Specific Question:   Preferred imaging location?    Answer:   [facility]    Order Specific Question:   Radiology Contrast Protocol - do NOT remove file path    Answer:   \\[email or web address]  . CT Abdomen Pelvis W Contrast    Standing Status:   Future    Standing Expiration Date:   10/10/2018    Order Specific Question:   If indicated for the ordered procedure, I authorize the administration of contrast media per Radiology protocol    Answer:   Yes    Order Specific Question:   Preferred imaging location?    Answer:   [facility]    Order Specific Question:   Radiology Contrast Protocol - do NOT remove file path    Answer:   \\[email or web address].pdf  . NM PET Image Restag (PS) Skull Base To Thigh    Standing Status:   Future    Standing Expiration Date:   10/10/2018    Order Specific Question:   If indicated for the ordered procedure, I authorize the administration of a radiopharmaceutical per Radiology protocol    Answer:   Yes    Order Specific Question:   Preferred imaging location?    Answer:   [facility]    Order Specific Question:   Radiology Contrast Protocol - do NOT remove file path    Answer:   \\[email or web address]  . CBC with Differential/Platelet    Standing Status:   Future    Standing Expiration Date:   10/11/2018  . Comprehensive metabolic panel    Standing Status:   Future    Standing Expiration Date:   10/11/2018  .  Lactate dehydrogenase    Standing Status:   Future    Standing Expiration Date:   10/11/2018  . CA 125    Standing Status:   Future    Standing Expiration Date:   10/11/2018       Zoila Shutter MD

## 2017-10-18 ENCOUNTER — Encounter (HOSPITAL_COMMUNITY): Payer: Self-pay | Admitting: Adult Health

## 2017-10-18 ENCOUNTER — Other Ambulatory Visit (HOSPITAL_COMMUNITY): Payer: Self-pay | Admitting: Adult Health

## 2017-10-18 DIAGNOSIS — C541 Malignant neoplasm of endometrium: Secondary | ICD-10-CM

## 2017-10-18 DIAGNOSIS — C569 Malignant neoplasm of unspecified ovary: Secondary | ICD-10-CM

## 2017-10-18 DIAGNOSIS — G893 Neoplasm related pain (acute) (chronic): Secondary | ICD-10-CM

## 2017-10-18 MED ORDER — OXYCODONE-ACETAMINOPHEN 10-325 MG PO TABS: 1.0000 | ORAL_TABLET | ORAL | 0 refills | Status: DC | PRN

## 2017-10-18 MED ORDER — OXYCODONE HCL 20 MG PO TABS: 1.0000 | ORAL_TABLET | ORAL | 0 refills | Status: DC | PRN

## 2017-10-18 NOTE — Progress Notes (Signed)
Patient called cancer center requesting refill of Oxycodone and Percocet.   [place] Controlled Substance Reporting System reviewed and refill is appropriate on or after 10/18/17. Medication e-scribed to her pharmacy ([facility]) using [employer]'s 2-step verification process.    [place] reviewed:     Mike Craze, NP [place] [phone]

## 2017-11-16 ENCOUNTER — Other Ambulatory Visit (HOSPITAL_COMMUNITY): Payer: Self-pay | Admitting: Hematology

## 2017-11-16 DIAGNOSIS — C541 Malignant neoplasm of endometrium: Secondary | ICD-10-CM

## 2017-11-16 DIAGNOSIS — G893 Neoplasm related pain (acute) (chronic): Secondary | ICD-10-CM

## 2017-11-16 DIAGNOSIS — C569 Malignant neoplasm of unspecified ovary: Secondary | ICD-10-CM

## 2017-11-16 MED ORDER — OXYCODONE-ACETAMINOPHEN 10-325 MG PO TABS: 1.0000 | ORAL_TABLET | ORAL | 0 refills | Status: DC | PRN

## 2017-11-29 ENCOUNTER — Other Ambulatory Visit (HOSPITAL_COMMUNITY): Payer: Self-pay

## 2017-11-29 DIAGNOSIS — C569 Malignant neoplasm of unspecified ovary: Secondary | ICD-10-CM

## 2017-11-29 DIAGNOSIS — C541 Malignant neoplasm of endometrium: Secondary | ICD-10-CM

## 2017-11-29 DIAGNOSIS — G893 Neoplasm related pain (acute) (chronic): Secondary | ICD-10-CM

## 2017-11-29 MED ORDER — OXYCODONE HCL 20 MG PO TABS: 1.0000 | ORAL_TABLET | ORAL | 0 refills | Status: DC | PRN

## 2017-11-29 NOTE — Telephone Encounter (Signed)
Received refill request from patient for Oxycodone HCI 20 mg. Reviewed with Dr. Walden Field, chart checked and Dr. Jacalyn Lefevre refill. Patient notified to pick up prescription.

## 2017-11-30 ENCOUNTER — Other Ambulatory Visit (HOSPITAL_COMMUNITY): Payer: Self-pay | Admitting: Adult Health

## 2017-11-30 DIAGNOSIS — G40901 Epilepsy, unspecified, not intractable, with status epilepticus: Secondary | ICD-10-CM

## 2017-12-01 ENCOUNTER — Other Ambulatory Visit (HOSPITAL_COMMUNITY): Payer: Self-pay

## 2017-12-01 DIAGNOSIS — G40901 Epilepsy, unspecified, not intractable, with status epilepticus: Secondary | ICD-10-CM

## 2017-12-01 MED ORDER — LEVETIRACETAM 500 MG PO TABS: 500.0000 mg | ORAL_TABLET | Freq: Two times a day (BID) | ORAL | 3 refills | Status: DC

## 2017-12-01 NOTE — Telephone Encounter (Signed)
[facility] request for [facility]ra [facility]mg  from [facility].  Reviewed with Dr. Walden Field and ok to give enough refills to last until next appointment.  Next appointment scheduled for July 2019.

## 2017-12-21 ENCOUNTER — Other Ambulatory Visit (HOSPITAL_COMMUNITY): Payer: Self-pay | Admitting: Internal Medicine

## 2017-12-21 ENCOUNTER — Other Ambulatory Visit (HOSPITAL_COMMUNITY): Payer: Self-pay | Admitting: Hematology

## 2017-12-22 ENCOUNTER — Other Ambulatory Visit (HOSPITAL_COMMUNITY): Payer: Self-pay | Admitting: *Deleted

## 2017-12-22 DIAGNOSIS — C569 Malignant neoplasm of unspecified ovary: Secondary | ICD-10-CM

## 2017-12-22 DIAGNOSIS — G893 Neoplasm related pain (acute) (chronic): Secondary | ICD-10-CM

## 2017-12-22 DIAGNOSIS — C541 Malignant neoplasm of endometrium: Secondary | ICD-10-CM

## 2017-12-22 MED ORDER — OXYCODONE HCL 20 MG PO TABS: 1.0000 | ORAL_TABLET | Freq: Two times a day (BID) | ORAL | 0 refills | Status: DC

## 2017-12-22 MED ORDER — OXYCODONE-ACETAMINOPHEN 10-325 MG PO TABS: 1.0000 | ORAL_TABLET | ORAL | 0 refills | Status: DC | PRN

## 2017-12-26 ENCOUNTER — Telehealth (HOSPITAL_COMMUNITY): Payer: Self-pay | Admitting: *Deleted

## 2017-12-26 NOTE — Telephone Encounter (Signed)
Encounter created in error

## 2018-01-09 ENCOUNTER — Encounter (HOSPITAL_COMMUNITY)
Admission: RE | Admit: 2018-01-09 | Discharge: 2018-01-09 | Disposition: A | Discharge status: Home / Self Care | Payer: Medicare HMO | Source: Ambulatory Visit | Attending: Internal Medicine | Admitting: Internal Medicine

## 2018-01-09 DIAGNOSIS — C569 Malignant neoplasm of unspecified ovary: Secondary | ICD-10-CM | POA: Diagnosis present

## 2018-01-09 DIAGNOSIS — C541 Malignant neoplasm of endometrium: Secondary | ICD-10-CM

## 2018-01-09 MED ORDER — FLUDEOXYGLUCOSE F - 18 (FDG) INJECTION
15.0000 | Freq: Once | INTRAVENOUS | Status: AC | PRN
  Administered 2018-01-09: 15.24 via INTRAVENOUS

## 2018-01-13 ENCOUNTER — Inpatient Hospital Stay (HOSPITAL_COMMUNITY): Payer: Medicare HMO | Attending: Internal Medicine | Admitting: Hematology

## 2018-01-13 ENCOUNTER — Encounter (HOSPITAL_COMMUNITY): Payer: Self-pay | Admitting: Hematology

## 2018-01-13 ENCOUNTER — Other Ambulatory Visit: Payer: Self-pay

## 2018-01-13 VITALS — BP 120/43 | HR 80 | Temp 98.5°F | Resp 18

## 2018-01-13 DIAGNOSIS — E669 Obesity, unspecified: Secondary | ICD-10-CM | POA: Diagnosis not present

## 2018-01-13 DIAGNOSIS — C569 Malignant neoplasm of unspecified ovary: Secondary | ICD-10-CM | POA: Diagnosis not present

## 2018-01-13 DIAGNOSIS — C541 Malignant neoplasm of endometrium: Secondary | ICD-10-CM | POA: Diagnosis not present

## 2018-01-13 DIAGNOSIS — E119 Type 2 diabetes mellitus without complications: Secondary | ICD-10-CM

## 2018-01-13 DIAGNOSIS — I10 Essential (primary) hypertension: Secondary | ICD-10-CM

## 2018-01-13 DIAGNOSIS — G620 Drug-induced polyneuropathy: Secondary | ICD-10-CM | POA: Diagnosis not present

## 2018-01-13 DIAGNOSIS — Z170 Estrogen receptor positive status [ER+]: Secondary | ICD-10-CM | POA: Insufficient documentation

## 2018-01-13 MED ORDER — OXYCODONE-ACETAMINOPHEN 10-325 MG PO TABS: 1.0000 | ORAL_TABLET | ORAL | 0 refills | Status: DC | PRN

## 2018-01-13 NOTE — Progress Notes (Signed)
Kathleen White[place], [place] [id number]   CLINIC:  Medical Oncology/Hematology  PCP:  Kathleen Labrum, MD [place] [id number] [phone]   REASON FOR VISIT:  Follow-up for metastatic Endometrial/Ovarian Cancer  CURRENT THERAPY: Observational   BRIEF ONCOLOGIC HISTORY:    Malignant neoplasm of ovary ([place])   04/30/2013 Initial Diagnosis    Ovarian cancer/uterine cancer status post TAH-BSO by Dr. Clenton Whitehead and associates, pathology could not be certain that these were not 2 separate primaries rather than one metastatic process from the uterus to the ovary.      08/15/2013 - 12/06/2013 Chemotherapy    Carboplatin/Taxol with dose reduction of 50% and Taxol due to grade 2+ peripheral neuropathy and gabapentin induced nightmare/hallucinations. 5 out of 6 cycles given with the final cycle being cancelled secondary to significant thrombocytopenia/intolerance.      09/10/2013 - 10/17/2013 Radiation Therapy    5040 cGy delivered by EBRT followed by intravaginal brachytherapy.      04/30/2014 PET scan    PET scan consistent with recurrent disease of either ovarian cancer or endometrial cancer.      06/03/2014 - 07/15/2014 Radiation Therapy    5040 cGy over 28 fractions delivered to the low periaortic area lymph node either metastatic endometrial or metastatic or ovarian cancer.      07/19/2014 Imaging    Imaging concerning for new mesenteric disease and subdiaphragmatic disease.      08/05/2014 PET scan    No evidence of progression of disease.      08/05/2014 Remission    Negative PET scan.      06/11/2016 PET scan    1. Hypermetabolic aortocaval and right common iliac adenopathy, maximum SUV 17.1, compatible with malignancy. 2. Calcified left mesenteric mass has only low-grade activity, maximum SUV 5.3, merit surveillance. 3. There several small calcifications along the omentum which are not currently metabolically active. 4. Along the  scarring in operative site from prior laparotomy there some faintly accentuated metabolic activity which is probably related to the original wound and unlikely to be from tumor. 5. Coronary, aortic arch, and branch vessel atherosclerotic vascular disease.      06/11/2016 Relapse/Recurrence    PET scan with aortocaval & right iliac adenopathy consistent with malignancy.       08/03/2016 -  Chemotherapy    Carboplatin Day 1/Gemzar Days 1&8 every 21 days       08/10/2016 Treatment Plan Change    Day 8 cycle 1 cancelled due to cytopenia      09/07/2016 Treatment Plan Change    Treatment delayed x 1 week due to thrombocytopenia.  Hypomagnesemia noted and replaced IV.      09/08/2016 - 09/12/2016 Hospital Admission    Admit date: 09/08/2016  Admission diagnosis: Seizure Additional comments: Intubated on 3/7 for airway protection and extubated on 09/09/2016.  Hospital course complicated by pancytopenia secondary to systemic chemotherapy.      09/08/2016 Imaging    EEG- This sedated EEG is abnormal due to diffuse slowing of the waking background.  Clinical Correlation of the above findings indicates diffuse cerebral dysfunction that is non-specific in etiology and can be seen with hypoxic/ischemic injury, toxic/metabolic encephalopathies, neurodegenerative disorders, or medication effect.  The absence of epileptiform discharges does not rule out a clinical diagnosis of epilepsy.  Clinical correlation is advised.      09/12/2016 Imaging    CT head- No metastatic disease or acute intracranial abnormality. Normal for age  CT appearance of the brain.      09/21/2016 Treatment Plan Change    Carboplatin deleted from treatment plan.  Will pursue single-agent Gemcitabine at reduced dose.      11/19/2016 Relapse/Recurrence    Diagnosis Lymph node, needle/core biopsy, left supraclavicular METASTATIC ADENOCARCINOMA, CONSISTENT WITH GYNECOLOGICAL PRIMARY.        INTERVAL HISTORY:  Kathleen Whitehead 67  y.o. female returns for routine follow-up for endometrial/ovarian cancer. Patient reports trying several lines of treatment and chemo and they causes cytopenia in the past. Patient is here today with her husband. Patient is in a wheelchair and doesn't walk much due to her neuropathy. Her actitivities at home include reading and laying in bed most of the time due to the severe neuropathy. She does not do any shoping nor does she get out much. In the past week she has developed a pain in her neck on the left side. She injured her shoulder years ago and she states it feels like the did then. Patient would like to consider some type of treatment and refused the hospice referral the last 2 visits. Patient states she has zero energy during the day but her appetite remains good at 75%. She does drink boost throughout the day to help get her nutrition. Patient is often constipated and nausaous. Denies vomiting or diarrhea. Denies and fevers or infections recently.     REVIEW OF SYSTEMS:  Review of Systems  Constitutional: Positive for fatigue.  HENT:  Negative.   Eyes: Negative.   Respiratory: Negative.   Cardiovascular: Negative.   Gastrointestinal: Positive for constipation and nausea.  Endocrine: Negative.   Genitourinary: Negative.    Skin: Negative.   Neurological: Positive for extremity weakness.  Hematological: Negative.   Psychiatric/Behavioral: Negative.      PAST MEDICAL/SURGICAL HISTORY:  Past Medical History:  Diagnosis Date  . Anemia   . Asthma   . Chemotherapy induced neutropenia ([place])   . Chemotherapy induced thrombocytopenia   . Diabetes mellitus without complication ([place])   . Endometrial cancer ([place])   . Hot flashes   . Hypertension   . Malignant neoplasm ([place])   . Malignant neoplasm of ovary ([place]) 04/30/2013  . Obesity   . Ovarian cancer ([place])   . Pancytopenia ([place])   . Peripheral artery disease ([place])   . Peripheral neuropathy   . Pneumonia 2015  . Port-A-Cath in  place   . Seizures ([place]) 09/08/2016   ?? due to low magnesium   Past Surgical History:  Procedure Laterality Date  . ABDOMINAL HYSTERECTOMY  2014     SOCIAL HISTORY:  Social History   Socioeconomic History  . Marital status: Married    Spouse name: Not on file  . Number of children: Not on file  . Years of education: Not on file  . Highest education level: Not on file  Occupational History  . Not on file  Social Needs  . Financial resource strain: Not on file  . Food insecurity:    Worry: Not on file    Inability: Not on file  . Transportation needs:    Medical: Not on file    Non-medical: Not on file  Tobacco Use  . Smoking status: Never Smoker  . Smokeless tobacco: Never Used  Substance and Sexual Activity  . Alcohol use: No  . Drug use: No  . Sexual activity: Not Currently  Lifestyle  . Physical activity:    Days per week: Not on file    Minutes  per session: Not on file  . Stress: Not on file  Relationships  . Social connections:    Talks on phone: Not on file    Gets together: Not on file    Attends religious service: Not on file    Active member of club or organization: Not on file    Attends meetings of clubs or organizations: Not on file    Relationship status: Not on file  . Intimate partner violence:    Fear of current or ex partner: Not on file    Emotionally abused: Not on file    Physically abused: Not on file    Forced sexual activity: Not on file  Other Topics Concern  . Not on file  Social History Narrative  . Not on file    FAMILY HISTORY:  Family History  Problem Relation Age of Onset  . Heart failure Mother   . Stroke Sister   . Cancer Sister   . Diabetes Sister   . Leukemia Sister   . Diabetes Brother     CURRENT MEDICATIONS:  Outpatient Encounter Medications as of 01/13/2018  Medication Sig Note  . acetaminophen (TYLENOL) 500 MG tablet Take 500 mg by mouth every 6 (six) hours as needed.   Marland Kitchen aspirin EC 325 MG tablet Take 325  mg by mouth daily.    . cetirizine (ZYRTEC) 10 MG tablet Take 10 mg by mouth daily.    . Cholecalciferol (VITAMIN D PO) Take 1 tablet by mouth daily.   . cyanocobalamin 1000 MCG tablet Take 1,000 mcg by mouth daily.   Marland Kitchen glipiZIDE (GLUCOTROL) 10 MG tablet TAKE 1 TABLET BY MOUTH TWICE A DAY   . levETIRAcetam (KEPPRA) 500 MG tablet Take 1 tablet (500 mg total) by mouth 2 (two) times daily.   Marland Kitchen lisinopril (PRINIVIL,ZESTRIL) 40 MG tablet Take 40 mg by mouth daily.    Marland Kitchen loperamide (IMODIUM) 2 MG capsule Take 2 mg by mouth as needed for diarrhea or loose stools.    . magnesium oxide (MAG-OX) 400 MG tablet TAKE 1 TABLET BY MOUTH TWICE A DAY   . metFORMIN (GLUCOPHAGE) 1000 MG tablet Take 1,000 mg by mouth 2 (two) times daily with a meal.    . Omega-3 Fatty Acids (FISH OIL) 1000 MG CAPS Take 1,000 mg by mouth daily.    . ondansetron (ZOFRAN) 8 MG tablet Take 1 tablet (8 mg total) by mouth every 8 (eight) hours as needed for nausea or vomiting. 09/08/2016: Spouse states pt takes both  . ondansetron (ZOFRAN-ODT) 4 MG disintegrating tablet Take 4 mg by mouth every 8 (eight) hours as needed for nausea or vomiting.  09/08/2016: Spouse states pt takes both  . Oxycodone HCl 20 MG TABS Take 1 tablet (20 mg total) by mouth every 12 (twelve) hours. For severe pain.   Marland Kitchen oxyCODONE-acetaminophen (PERCOCET) 10-325 MG tablet Take 1 tablet by mouth every 4 (four) hours as needed for pain.   . Polyethylene Glycol 3350 (MIRALAX PO) Take 17 g by mouth daily as needed for moderate constipation.    . prochlorperazine (COMPAZINE) 10 MG tablet TAKE 1 TABLET (10 MG TOTAL) BY MOUTH EVERY 6 (SIX) HOURS AS NEEDED FOR NAUSEA OR VOMITING.   . [DISCONTINUED] levETIRAcetam (KEPPRA) 500 MG tablet TAKE 1 TABLET BY MOUTH TWICE A DAY   . [DISCONTINUED] Magnesium Oxide 400 (240 Mg) MG TABS Take 1 tablet by mouth 2 (two) times daily.   . [DISCONTINUED] oxyCODONE-acetaminophen (PERCOCET) 10-325 MG tablet Take 1 tablet by  mouth every 4 (four) hours  as needed for pain.    No facility-administered encounter medications on file as of 01/13/2018.     ALLERGIES:  Allergies  Allergen Reactions  . Diphenhydramine Palpitations  . Iodinated Diagnostic Agents Other (See Comments)    Unknown  . Duloxetine Hcl Nausea And Vomiting  . Ibuprofen Other (See Comments)    Makes my bones hurt  . Penicillins Rash     PHYSICAL EXAM:  ECOG Performance status: 2  Vitals:   01/13/18 1428  BP: (!) 120/43  Pulse: 80  Resp: 18  Temp: 98.5 F (36.9 C)  SpO2: 97%   There were no vitals filed for this visit.  Physical Exam   LABORATORY DATA:  I have reviewed the labs as listed.  CBC    Component Value Date/Time   WBC 6.2 10/10/2017 1340   RBC 3.59 (L) 10/10/2017 1340   HGB 11.4 (L) 10/10/2017 1340   HCT 34.7 (L) 10/10/2017 1340   PLT 149 (L) 10/10/2017 1340   MCV 96.7 10/10/2017 1340   MCH 31.8 10/10/2017 1340   MCHC 32.9 10/10/2017 1340   RDW 12.6 10/10/2017 1340   LYMPHSABS 1.8 10/10/2017 1340   MONOABS 0.5 10/10/2017 1340   EOSABS 0.1 10/10/2017 1340   BASOSABS 0.0 10/10/2017 1340   CMP Latest Ref Rng & Units 10/10/2017 07/12/2017 12/02/2016  Glucose 65 - 99 mg/dL 144(H) 160(H) 383(H)  BUN 6 - 20 mg/dL 26(H) 21(H) 21(H)  Creatinine 0.44 - 1.00 mg/dL 1.15(H) 1.03(H) 1.08(H)  Sodium 135 - 145 mmol/L 136 134(L) 134(L)  Potassium 3.5 - 5.1 mmol/L 4.4 4.2 4.3  Chloride 101 - 111 mmol/L 100(L) 98(L) 99(L)  CO2 22 - 32 mmol/L '23 24 25  '$ Calcium 8.9 - 10.3 mg/dL 10.3 9.9 9.1  Total Protein 6.5 - 8.1 g/dL 7.4 6.6 6.8  Total Bilirubin 0.3 - 1.2 mg/dL 0.7 0.4 0.4  Alkaline Phos 38 - 126 U/L 58 48 69  AST 15 - 41 U/L '23 24 28  '$ ALT 14 - 54 U/L '15 16 19       '$ DIAGNOSTIC IMAGING:  I have independently reviewed PET CT scan results dated 01/10/2018 and reviewed the images with her.      ASSESSMENT & PLAN:   Malignant neoplasm of ovary ([place]) 1.  Recurrent ovarian cancer: - Status post TAH and BSO in 2014 followed by carboplatin and  Taxol for 5 cycles, last cycle DC'd due to thrombocytopenia, followed by EBRT and brachii therapy. -PET imaging and December 2017 demonstrated progression, treated with systemic chemotherapy with carboplatin/gemcitabine from 08/03/2016 through April 2018, poorly tolerated secondary to cytopenias. - Foundation 1 results show MS stable, TMB intermediate, no other actionable mutations, PDL 1 of 0% - In May 2018, left supraclavicular lymph node biopsy consistent with metastatic adenocarcinoma, GYN primary, ER positive - Discussed the results of the PET CT scan dated 01/10/2018 which shows progression of disease, with a hypermetabolic left supraclavicular lymph node, 3 new lung nodules in both lungs, progression of retroperitoneal adenopathy. - Patient would like to have therapy for her cancer as long as she can tolerate it well.  Because of her severe neuropathy she would not be a candidate for most chemotherapeutic agents in this setting.  We can use single agent carboplatin as she is platinum sensitive.  Since her tumor is slowly growing, I have recommended antiestrogen therapy with anastrozole.  We discussed the side effects in detail. -I have also recommended somatic and germline BRCA1/2 testing.  This will give [place] the option of targeted therapy in the form of PARP1 inhibitors. -We will see her back in 1 month for follow-up with repeat blood work.  2.  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 -We have refilled her Percocet prescription.  She takes it every 4-6 hours.      Orders placed this encounter:  Orders Placed This Encounter  Procedures  . CBC with Differential/Platelet  . Comprehensive metabolic panel  . CA Barry, MD [place] [phone]

## 2018-01-13 NOTE — Assessment & Plan Note (Addendum)
1.  Recurrent ovarian cancer: - Status post TAH and BSO in 2014 followed by carboplatin and Taxol for 5 cycles, last cycle DC'd due to thrombocytopenia, followed by EBRT and brachii therapy. -PET imaging and December 2017 demonstrated progression, treated with systemic chemotherapy with carboplatin/gemcitabine from 08/03/2016 through April 2018, poorly tolerated secondary to cytopenias. - Foundation 1 results show MS stable, TMB intermediate, no other actionable mutations, PDL 1 of 0% - In May 2018, left supraclavicular lymph node biopsy consistent with metastatic adenocarcinoma, GYN primary, ER positive - Discussed the results of the PET CT scan dated 01/10/2018 which shows progression of disease, with a hypermetabolic left supraclavicular lymph node, 3 new lung nodules in both lungs, progression of retroperitoneal adenopathy. - Patient would like to have therapy for her cancer as long as she can tolerate it well.  Because of her severe neuropathy she would not be a candidate for most chemotherapeutic agents in this setting.  We can use single agent carboplatin as she is platinum sensitive.  Since her tumor is slowly growing, I have recommended antiestrogen therapy with anastrozole.  We discussed the side effects in detail. -I have also recommended somatic and germline BRCA1/2 testing.  This will give [place] the option of targeted therapy in the form of PARP1 inhibitors. -We will see her back in 1 month for follow-up with repeat blood work.  2.  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 -We have refilled her Percocet prescription.  She takes it every 4-6 hours.

## 2018-01-22 ENCOUNTER — Other Ambulatory Visit: Payer: Self-pay

## 2018-01-22 ENCOUNTER — Emergency Department (HOSPITAL_COMMUNITY): Payer: Medicare HMO

## 2018-01-22 ENCOUNTER — Encounter (HOSPITAL_COMMUNITY): Payer: Self-pay | Admitting: Emergency Medicine

## 2018-01-22 ENCOUNTER — Observation Stay (HOSPITAL_COMMUNITY)
Admission: EM | Admit: 2018-01-22 | Discharge: 2018-01-24 | Disposition: A | Discharge status: Home / Self Care | Payer: Medicare HMO | Attending: Internal Medicine | Admitting: Internal Medicine

## 2018-01-22 DIAGNOSIS — R0789 Other chest pain: Secondary | ICD-10-CM | POA: Diagnosis not present

## 2018-01-22 DIAGNOSIS — C569 Malignant neoplasm of unspecified ovary: Secondary | ICD-10-CM | POA: Diagnosis present

## 2018-01-22 DIAGNOSIS — R0602 Shortness of breath: Principal | ICD-10-CM | POA: Insufficient documentation

## 2018-01-22 DIAGNOSIS — C7960 Secondary malignant neoplasm of unspecified ovary: Secondary | ICD-10-CM | POA: Diagnosis not present

## 2018-01-22 DIAGNOSIS — I129 Hypertensive chronic kidney disease with stage 1 through stage 4 chronic kidney disease, or unspecified chronic kidney disease: Secondary | ICD-10-CM | POA: Diagnosis not present

## 2018-01-22 DIAGNOSIS — E1122 Type 2 diabetes mellitus with diabetic chronic kidney disease: Secondary | ICD-10-CM | POA: Insufficient documentation

## 2018-01-22 DIAGNOSIS — G40909 Epilepsy, unspecified, not intractable, without status epilepticus: Secondary | ICD-10-CM | POA: Insufficient documentation

## 2018-01-22 DIAGNOSIS — N183 Chronic kidney disease, stage 3 unspecified: Secondary | ICD-10-CM

## 2018-01-22 DIAGNOSIS — G8929 Other chronic pain: Secondary | ICD-10-CM | POA: Diagnosis not present

## 2018-01-22 DIAGNOSIS — I5031 Acute diastolic (congestive) heart failure: Secondary | ICD-10-CM

## 2018-01-22 DIAGNOSIS — J45909 Unspecified asthma, uncomplicated: Secondary | ICD-10-CM | POA: Insufficient documentation

## 2018-01-22 DIAGNOSIS — R0600 Dyspnea, unspecified: Secondary | ICD-10-CM | POA: Diagnosis present

## 2018-01-22 DIAGNOSIS — E669 Obesity, unspecified: Secondary | ICD-10-CM | POA: Diagnosis present

## 2018-01-22 DIAGNOSIS — E119 Type 2 diabetes mellitus without complications: Secondary | ICD-10-CM

## 2018-01-22 LAB — CBC WITH DIFFERENTIAL/PLATELET
BASOS ABS: 0 10*3/uL (ref 0.0–0.1)
Basophils Relative: 0 %
EOS ABS: 0.2 10*3/uL (ref 0.0–0.7)
EOS PCT: 2 %
HEMATOCRIT: 34.5 % — AB (ref 36.0–46.0)
HEMOGLOBIN: 11.4 g/dL — AB (ref 12.0–15.0)
Lymphocytes Relative: 20 %
Lymphs Abs: 1.7 10*3/uL (ref 0.7–4.0)
MCH: 32.9 pg (ref 26.0–34.0)
MCHC: 33 g/dL (ref 30.0–36.0)
MCV: 99.4 fL (ref 78.0–100.0)
Monocytes Absolute: 0.5 10*3/uL (ref 0.1–1.0)
Monocytes Relative: 6 %
Neutro Abs: 6.1 10*3/uL (ref 1.7–7.7)
Neutrophils Relative %: 72 %
Platelets: 160 10*3/uL (ref 150–400)
RBC: 3.47 MIL/uL — AB (ref 3.87–5.11)
RDW: 13 % (ref 11.5–15.5)
WBC: 8.5 10*3/uL (ref 4.0–10.5)

## 2018-01-22 LAB — URINALYSIS, ROUTINE W REFLEX MICROSCOPIC
BACTERIA UA: NONE SEEN
Bilirubin Urine: NEGATIVE
GLUCOSE, UA: NEGATIVE mg/dL
HGB URINE DIPSTICK: NEGATIVE
KETONES UR: 5 mg/dL — AB
Nitrite: NEGATIVE
PROTEIN: NEGATIVE mg/dL
Specific Gravity, Urine: 1.042 — ABNORMAL HIGH (ref 1.005–1.030)
pH: 5 (ref 5.0–8.0)

## 2018-01-22 LAB — COMPREHENSIVE METABOLIC PANEL
ALBUMIN: 3.1 g/dL — AB (ref 3.5–5.0)
ALK PHOS: 54 U/L (ref 38–126)
ALT: 13 U/L (ref 0–44)
ANION GAP: 10 (ref 5–15)
AST: 18 U/L (ref 15–41)
BILIRUBIN TOTAL: 0.6 mg/dL (ref 0.3–1.2)
BUN: 24 mg/dL — AB (ref 8–23)
CALCIUM: 10.5 mg/dL — AB (ref 8.9–10.3)
CO2: 27 mmol/L (ref 22–32)
Chloride: 103 mmol/L (ref 98–111)
Creatinine, Ser: 1.29 mg/dL — ABNORMAL HIGH (ref 0.44–1.00)
GFR calc Af Amer: 48 mL/min — ABNORMAL LOW (ref >60)
GFR calc non Af Amer: 42 mL/min — ABNORMAL LOW (ref >60)
Glucose, Bld: 173 mg/dL — ABNORMAL HIGH (ref 70–99)
Potassium: 3.8 mmol/L (ref 3.5–5.1)
Sodium: 140 mmol/L (ref 135–145)
TOTAL PROTEIN: 6.9 g/dL (ref 6.5–8.1)

## 2018-01-22 LAB — TROPONIN I: Troponin I: <0.03 ng/mL (ref <0.03)

## 2018-01-22 LAB — BRAIN NATRIURETIC PEPTIDE: B Natriuretic Peptide: 68 pg/mL (ref 0.0–100.0)

## 2018-01-22 MED ORDER — SODIUM CHLORIDE 0.9 % IV SOLN: 250.0000 mL | INTRAVENOUS | Status: DC | PRN

## 2018-01-22 MED ORDER — LISINOPRIL 10 MG PO TABS
40.0000 mg | ORAL_TABLET | Freq: Every day | ORAL | Status: DC
  Administered 2018-01-22 – 2018-01-24 (×3): 40 mg via ORAL
  Filled 2018-01-22 (×4): qty 4

## 2018-01-22 MED ORDER — ONDANSETRON HCL 4 MG/2ML IJ SOLN
4.0000 mg | Freq: Once | INTRAMUSCULAR | Status: AC
  Administered 2018-01-22: 4 mg via INTRAVENOUS
  Filled 2018-01-22: qty 2

## 2018-01-22 MED ORDER — OXYCODONE HCL 5 MG PO TABS
5.0000 mg | ORAL_TABLET | ORAL | Status: DC | PRN
Start: 2018-01-22 — End: 2018-01-24
  Administered 2018-01-22 – 2018-01-24 (×9): 5 mg via ORAL
  Filled 2018-01-22 (×9): qty 1

## 2018-01-22 MED ORDER — CALCIUM CARBONATE ANTACID 750 MG PO CHEW: 1.0000 | CHEWABLE_TABLET | Freq: Every day | ORAL | Status: DC | PRN

## 2018-01-22 MED ORDER — ALBUTEROL SULFATE (2.5 MG/3ML) 0.083% IN NEBU: 2.5000 mg | INHALATION_SOLUTION | RESPIRATORY_TRACT | Status: DC | PRN

## 2018-01-22 MED ORDER — ASPIRIN EC 325 MG PO TBEC
325.0000 mg | DELAYED_RELEASE_TABLET | Freq: Every day | ORAL | Status: DC
  Administered 2018-01-22 – 2018-01-24 (×3): 325 mg via ORAL
  Filled 2018-01-22 (×4): qty 1

## 2018-01-22 MED ORDER — GLIPIZIDE 10 MG PO TABS
10.0000 mg | ORAL_TABLET | Freq: Two times a day (BID) | ORAL | Status: DC
  Administered 2018-01-23 (×2): 10 mg via ORAL
  Filled 2018-01-22 (×2): qty 1
  Filled 2018-01-22 (×2): qty 2
  Filled 2018-01-22 (×3): qty 1

## 2018-01-22 MED ORDER — ONDANSETRON 4 MG PO TBDP
4.0000 mg | ORAL_TABLET | Freq: Three times a day (TID) | ORAL | Status: DC | PRN
  Administered 2018-01-23: 4 mg via ORAL
  Filled 2018-01-22: qty 1

## 2018-01-22 MED ORDER — LOPERAMIDE HCL 2 MG PO CAPS: 2.0000 mg | ORAL_CAPSULE | ORAL | Status: DC | PRN

## 2018-01-22 MED ORDER — IOPAMIDOL (ISOVUE-370) INJECTION 76%
100.0000 mL | Freq: Once | INTRAVENOUS | Status: AC | PRN
  Administered 2018-01-22: 65 mL via INTRAVENOUS

## 2018-01-22 MED ORDER — VITAMIN B-12 1000 MCG PO TABS
1000.0000 ug | ORAL_TABLET | Freq: Every day | ORAL | Status: DC
  Administered 2018-01-22 – 2018-01-24 (×3): 1000 ug via ORAL
  Filled 2018-01-22 (×4): qty 1

## 2018-01-22 MED ORDER — LEVETIRACETAM 500 MG PO TABS
500.0000 mg | ORAL_TABLET | Freq: Two times a day (BID) | ORAL | Status: DC
  Administered 2018-01-22 – 2018-01-24 (×4): 500 mg via ORAL
  Filled 2018-01-22 (×5): qty 1

## 2018-01-22 MED ORDER — MAGNESIUM OXIDE 400 (241.3 MG) MG PO TABS
400.0000 mg | ORAL_TABLET | Freq: Two times a day (BID) | ORAL | Status: DC
  Administered 2018-01-22 – 2018-01-24 (×4): 400 mg via ORAL
  Filled 2018-01-22 (×5): qty 1

## 2018-01-22 MED ORDER — ONDANSETRON HCL 4 MG PO TABS
8.0000 mg | ORAL_TABLET | Freq: Three times a day (TID) | ORAL | Status: DC | PRN
  Administered 2018-01-23 (×2): 8 mg via ORAL
  Filled 2018-01-22 (×2): qty 2

## 2018-01-22 MED ORDER — CALCIUM CARBONATE ANTACID 500 MG PO CHEW
1.0000 | CHEWABLE_TABLET | Freq: Every day | ORAL | Status: DC | PRN
  Administered 2018-01-22 – 2018-01-23 (×2): 200 mg via ORAL
  Filled 2018-01-22 (×2): qty 1

## 2018-01-22 MED ORDER — SIMETHICONE 80 MG PO CHEW: 80.0000 mg | CHEWABLE_TABLET | Freq: Four times a day (QID) | ORAL | Status: DC | PRN

## 2018-01-22 MED ORDER — PROCHLORPERAZINE MALEATE 5 MG PO TABS
10.0000 mg | ORAL_TABLET | Freq: Four times a day (QID) | ORAL | Status: DC | PRN
  Administered 2018-01-23: 10 mg via ORAL
  Filled 2018-01-22: qty 2

## 2018-01-22 MED ORDER — ACETAMINOPHEN 500 MG PO TABS
500.0000 mg | ORAL_TABLET | Freq: Four times a day (QID) | ORAL | Status: DC | PRN
  Administered 2018-01-23 – 2018-01-24 (×2): 500 mg via ORAL
  Filled 2018-01-22 (×2): qty 1

## 2018-01-22 MED ORDER — SODIUM CHLORIDE 0.9% FLUSH
3.0000 mL | Freq: Two times a day (BID) | INTRAVENOUS | Status: DC
  Administered 2018-01-22 – 2018-01-24 (×4): 3 mL via INTRAVENOUS

## 2018-01-22 MED ORDER — IPRATROPIUM-ALBUTEROL 0.5-2.5 (3) MG/3ML IN SOLN
3.0000 mL | Freq: Once | RESPIRATORY_TRACT | Status: AC
  Administered 2018-01-22: 3 mL via RESPIRATORY_TRACT
  Filled 2018-01-22: qty 3

## 2018-01-22 MED ORDER — SODIUM CHLORIDE 0.9% FLUSH
3.0000 mL | INTRAVENOUS | Status: DC | PRN
Start: 2018-01-22 — End: 2018-01-24

## 2018-01-22 MED ORDER — OXYCODONE HCL 5 MG PO TABS: 20.0000 mg | ORAL_TABLET | Freq: Two times a day (BID) | ORAL | Status: DC

## 2018-01-22 MED ORDER — METHYLPREDNISOLONE SODIUM SUCC 125 MG IJ SOLR
125.0000 mg | Freq: Once | INTRAMUSCULAR | Status: AC
  Administered 2018-01-22: 125 mg via INTRAVENOUS
  Filled 2018-01-22: qty 2

## 2018-01-22 MED ORDER — OXYCODONE-ACETAMINOPHEN 5-325 MG PO TABS
1.0000 | ORAL_TABLET | ORAL | Status: DC | PRN
Start: 2018-01-22 — End: 2018-01-24
  Administered 2018-01-22 – 2018-01-24 (×9): 1 via ORAL
  Filled 2018-01-22 (×9): qty 1

## 2018-01-22 NOTE — ED Notes (Signed)
Hx ovarian Ca Last Chemo ? April  Pain today to L back, chest and shoulder  Pt is pale and appears in uncomfortable

## 2018-01-22 NOTE — ED Notes (Signed)
IV est  Labs drawn and given to phlebotomist

## 2018-01-22 NOTE — ED Notes (Signed)
Call from CT They are informed that port is not accessed and pt has 20 ga in R [facility]

## 2018-01-22 NOTE — ED Notes (Signed)
Assisted to BR, back to bed  Reports N is abated

## 2018-01-22 NOTE — ED Provider Notes (Signed)
Emergency Department Provider Note   I have reviewed the triage vital signs and the nursing notes.   HISTORY  Chief Complaint Shortness of Breath   HPI Kathleen Whitehead is a 67 y.o. female ovarian cancer that has been in and out of remission for the last 2 years but most recently found to be static with metastatic lesions in her lungs and is getting ready to restart chemotherapy but has not done so yet.  She resents today because of shortness of breath.  States that she may have some left shoulder pain for years ever since a fracture but most recently her she had a left anterior shoulder pain is different and worse and then also some left/mid/right upper scapular pain.  This is new since today.  All the symptoms seemed of started sometime this morning.  She tried taking Vicodin for the pain but it seemed to help.  States she feels it is hard to take a deep breath and it feels like she is breathing fast.  No anterior chest pain.  She does have a cough but no fever.  Not productive.  She does have left greater than right lower extremity swelling which is her baseline she states.  This comes and goes. No other associated or modifying symptoms.    Past Medical History:  Diagnosis Date  . Anemia   . Asthma   . Chemotherapy induced neutropenia ([place])   . Chemotherapy induced thrombocytopenia   . Diabetes mellitus without complication ([place])   . Endometrial cancer ([place])   . Hot flashes   . Hypertension   . Malignant neoplasm ([place])   . Malignant neoplasm of ovary ([place]) 04/30/2013  . Obesity   . Ovarian cancer ([place])   . Pancytopenia ([place])   . Peripheral artery disease ([place])   . Peripheral neuropathy   . Pneumonia 2015  . Port-A-Cath in place   . Seizures ([place]) 09/08/2016   ?? due to low magnesium    Patient Active Problem List   Diagnosis Date Noted  . Goals of care, counseling/discussion 09/21/2016  . Encounter for orogastric (OG) tube placement   . Encounter for intubation   .  Dyspnea   . Status epilepticus ([place]) 09/08/2016  . Acute respiratory failure ([place])   . 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 Axillary lump 12/08/2015  . Blush 03/11/2015  . Acquired pancytopenia ([place]) 12/09/2014  . Presence of other vascular implants and grafts 04/18/2014  . Drug-induced low platelet count 12/04/2013  . Peripheral nerve disease 12/04/2013  . Chemotherapy-induced neutropenia ([place]) 08/24/2013  . Malignant neoplasm of endometrium ([place]) 06/11/2013  . Obesity (BMI 30-39.9) 05/07/2013  . Abdominal pain 04/30/2013  . Acute blood loss anemia 04/30/2013  . Type 2 diabetes mellitus ([place]) 04/30/2013  . BP (high blood pressure) 04/30/2013  . Malignant neoplasm of ovary ([place]) 04/30/2013    Past Surgical History:  Procedure Laterality Date  . ABDOMINAL HYSTERECTOMY  2014    Current Outpatient Rx  . Order #: [id number] Class: Historical Med  . Order #: [id number] Class: Historical Med  . Order #: [id number] Class: Historical Med  . Order #: [id number] Class: Historical Med  . Order #: [id number] Class: Historical Med  . Order #: [id number] Class: Historical Med  . Order #: [id number] Class: Normal  . Order #: [id number] Class: Historical Med  . Order #: [id number] Class: Historical Med  . Order #: [id number] Class: Normal  . Order #: [id number] Class: Historical Med  . Order #: [id number] Class: Historical Med  . Order #: [id number] Class: Normal  . Order #:  [id number] Class: Historical Med  . Order #: [id number] Class: Print  . Order #: [id number] Class: Normal  . Order #: [id number] Class: Historical Med  . Order #: [id number] Class: Normal    Allergies Diphenhydramine; Iodinated diagnostic agents; Duloxetine hcl; Ibuprofen; and Penicillins  Family History  Problem Relation Age of Onset  . Heart failure Mother   . Stroke Sister   . Cancer Sister   . Diabetes Sister   . Leukemia Sister   . Diabetes Brother     Social History Social History   Tobacco Use  . Smoking status:  Never Smoker  . Smokeless tobacco: Never Used  Substance Use Topics  . Alcohol use: No  . Drug use: No    Review of Systems  All other systems negative except as documented in the HPI. All pertinent positives and negatives as reviewed in the HPI. ____________________________________________   PHYSICAL EXAM:  VITAL SIGNS: ED Triage Vitals  Enc Vitals Group     BP 01/22/18 1358 (!) 199/71     Pulse Rate 01/22/18 1358 85     Resp 01/22/18 1358 18     Temp 01/22/18 1358 97.6 F (36.4 C)     Temp src --      SpO2 01/22/18 1358 97 %     Weight 01/22/18 1359 239 lb (108.4 kg)     Height 01/22/18 1359 5\' 9"  (1.753 m)    Constitutional: Alert and oriented. Well appearing and in no acute distress. Eyes: Conjunctivae are normal. PERRL. EOMI. Head: Atraumatic. Nose: No congestion/rhinnorhea. Mouth/Throat: Mucous membranes are moist.  Oropharynx non-erythematous. Neck: No stridor.  No meningeal signs.   Cardiovascular: Normal rate, regular rhythm. Good peripheral circulation. Grossly normal heart sounds.   Respiratory: tachypneic respiratory effort.  No retractions. Lungs bilaterally diminished with some crackles. Gastrointestinal: Soft and nontender. No distention.  Musculoskeletal: No lower extremity tenderness nor edema. No gross deformities of extremities. ttp to paraspinal muscles bilaterally. Neurologic:  Normal speech and language. No gross focal neurologic deficits are appreciated.  Skin:  Skin is warm, dry and intact. No rash noted.  ____________________________________________   LABS (all labs ordered are listed, but only abnormal results are displayed)  Labs Reviewed  CBC WITH DIFFERENTIAL/PLATELET - Abnormal; Notable for the following components:      Result Value   RBC 3.47 (*)    Hemoglobin 11.4 (*)    HCT 34.5 (*)    All other components within normal limits  COMPREHENSIVE METABOLIC PANEL - Abnormal; Notable for the following components:   Glucose, Bld 173 (*)     BUN 24 (*)    Creatinine, Ser 1.29 (*)    Calcium 10.5 (*)    Albumin 3.1 (*)    GFR calc non Af Amer 42 (*)    GFR calc Af Amer 48 (*)    All other components within normal limits  TROPONIN I  BRAIN NATRIURETIC PEPTIDE  URINALYSIS, ROUTINE W REFLEX MICROSCOPIC   ____________________________________________  EKG   EKG Interpretation  Date/Time:  Sunday January 22 2018 14:03:19 EDT Ventricular Rate:  85 PR Interval:    QRS Duration: 88 QT Interval:  332 QTC Calculation: 395 R Axis:   4 Text Interpretation:  Sinus rhythm Low voltage, precordial leads Minimal ST depression, lateral leads No old tracing to compare Confirmed by Merrily Pew [phone]) on 01/22/2018 2:47:22 PM       ____________________________________________  RADIOLOGY  Dg Chest Port 1 View  Result Date: 01/22/2018 CLINICAL DATA:  Shortness of breath EXAM: PORTABLE CHEST  1 VIEW COMPARISON:  Chest radiograph September 10, 2016 and chest CT January 12, 2017 FINDINGS: Port-A-Cath tip is in the right atrium slightly beyond the cavoatrial junction. No pneumothorax. There is no evident edema or consolidation. Heart size and pulmonary vascularity are normal. No adenopathy. No evident bone lesions. IMPRESSION: Port-A-Cath as described without pneumothorax. No edema or consolidation. Stable cardiac silhouette. Electronically Signed   By: Lowella Grip III M.D.   On: 01/22/2018 14:42    ____________________________________________   PROCEDURES  Procedure(s) performed:   Procedures   ____________________________________________   INITIAL IMPRESSION / ASSESSMENT AND PLAN / ED COURSE  Secondary to diminished breath sounds could be bronchoconstriction so we will try albuterol.  Also would consider pleural effusion with her metastatic disease to her lungs on a recent PET scan.  Also consider PE if none of these are present.  If no pneumonia, pleural effusion or pulmonary edema will add on CT scan.  Also will check for  heart failure with lower extremity swelling and scapular pain however the scapula pain does seem to be muscular in nature as it is tender with palpation with movement of her arm.   CXR ok. Will proceed with CT. Apparently has an allergy to contrast but has had it in past, will decide on pretreatment.   Care transferred pending completion of workup and reevaluation. If no cause found, possibly new heart failure? Will likely need ambulation. duoneb did not seem to help much.  Pertinent labs & imaging results that were available during my care of the patient were reviewed by me and considered in my medical decision making (see chart for details).  ____________________________________________  FINAL CLINICAL IMPRESSION(S) / ED DIAGNOSES  Final diagnoses:  SOB (shortness of breath)     MEDICATIONS GIVEN DURING THIS VISIT:  Medications  ipratropium-albuterol (DUONEB) 0.5-2.5 (3) MG/3ML nebulizer solution 3 mL (3 mLs Nebulization Given 01/22/18 1435)     NEW OUTPATIENT MEDICATIONS STARTED DURING THIS VISIT:  New Prescriptions   No medications on file    Note:  This note was prepared with assistance of Dragon voice recognition software. Occasional wrong-word or sound-a-like substitutions may have occurred due to the inherent limitations of voice recognition software.   Merrily Pew, MD 01/22/18 1539

## 2018-01-22 NOTE — ED Notes (Signed)
Call to resp 

## 2018-01-22 NOTE — ED Notes (Signed)
To CT

## 2018-01-22 NOTE — Progress Notes (Signed)
Peakflow of 250 mL. Patient had good effort and technique.

## 2018-01-22 NOTE — ED Notes (Signed)
Pt reports she asked doctor if she could take one of her percocets and has taken one

## 2018-01-22 NOTE — ED Notes (Signed)
Awaiting eval  

## 2018-01-22 NOTE — ED Triage Notes (Signed)
Patient c/o shortness of breath with nonproductive cough that started this morning. Denies any chest pain but reports pain between shoulder blades and pain to left shoulder. Per patient the shoulder pain has been ongoing x3 days. Patient reports recently having a PET scan because she has reoccurrent ovarian cancer. Patient states oncologist possibly stated cancer has spread to left lung.

## 2018-01-22 NOTE — ED Notes (Signed)
Call for report  Nurses are in report and cannot be reached

## 2018-01-22 NOTE — H&P (Signed)
History and Physical    Kathleen Whitehead HQI:[id number] DOB: 1950-11-10 DOA: 01/22/2018  PCP: Juliette Alcide, MD  Patient coming from: home  Chief Complaint: sob  HPI: Kathleen Whitehead is a 67 y.o. female with medical history significant of ovarian cancer now with diffuse mets comes in with sob since this am worse than normal.  She denies pain.  She denies worsening le edema, same as usual.  She denies pleuritic pain.  She denies fevers or cough.  Pt has had a neg CTA in the ED.  Normal trop and nonischemic EKG.  ED requesting admit for possible underlying ACS.  Review of Systems: As per HPI otherwise 10 point review of systems negative.   Past Medical History:  Diagnosis Date  . Anemia   . Asthma   . Chemotherapy induced neutropenia ([facility])   . Chemotherapy induced thrombocytopenia   . Diabetes mellitus without complication ([facility])   . Endometrial cancer ([facility])   . Hot flashes   . Hypertension   . Malignant neoplasm ([facility])   . Malignant neoplasm of ovary ([facility]) 04/30/2013  . Obesity   . Ovarian cancer ([facility])   . Pancytopenia (H[facility])   . Peripheral artery disease (H[facility])   . Peripheral neuropathy   . Pneumonia 2015  . Port-A-Cath in place   . Seizures ([facility]) 09/08/2016   ?? due to low magnesium    Past Surgical History:  Procedure Laterality Date  . ABDOMINAL HYSTERECTOMY  2014     reports that she has never smoked. She has never used smokeless tobacco. She reports that she does not drink alcohol or use drugs.  Allergies  Allergen Reactions  . Diphenhydramine Palpitations  . Iodinated Diagnostic Agents Other (See Comments)    Unknown  . Duloxetine Hcl Nausea And Vomiting  . Ibuprofen Other (See Comments)    Makes my bones hurt  . Penicillins Rash    Has patient had a PCN reaction causing immediate rash, facial/tongue/throat swelling, SOB or lightheadedness with hypotension: No Has patient had a PCN reaction causing severe rash involving mucus membranes or skin necrosis: No Has  patient had a PCN reaction that required hospitalization: No Has patient had a PCN reaction occurring within the last 10 years: No If all of the above answers are "NO", then may proceed with Cephalosporin use.     Family History  Problem Relation Age of Onset  . Heart failure Mother   . Stroke Sister   . Cancer Sister   . Diabetes Sister   . Leukemia Sister   . Diabetes Brother     Prior to Admission medications   Medication Sig Start Date End Date Taking? Authorizing Provider  acetaminophen (TYLENOL) 500 MG tablet Take 500 mg by mouth every 6 (six) hours as needed for mild pain or moderate pain.    Yes [provider]  aspirin EC 325 MG tablet Take 325 mg by mouth daily.  09/15/15  Yes [provider]  calcium carbonate (TUMS EX) 750 MG chewable tablet Chew 1 tablet by mouth daily as needed for heartburn.   Yes [provider]  calcium carbonate (TUMS SMOOTHIES) 750 MG chewable tablet Chew 1 tablet by mouth daily as needed for heartburn.   Yes [provider]  cetirizine (ZYRTEC) 10 MG tablet Take 10 mg by mouth daily.    Yes [provider]  Cholecalciferol (VITAMIN D PO) Take 1 tablet by mouth daily.   Yes [provider]  cyanocobalamin 1000 MCG tablet Take 1,000  mcg by mouth daily.   Yes [provider]  glipiZIDE (GLUCOTROL) 10 MG tablet TAKE 1 TABLET BY MOUTH TWICE A DAY 01/23/16  Yes [provider]  levETIRAcetam (KEPPRA) 500 MG tablet Take 1 tablet (500 mg total) by mouth 2 (two) times daily. 12/01/17  Yes Higgs, Minta Balsam, MD  lisinopril (PRINIVIL,ZESTRIL) 40 MG tablet Take 40 mg by mouth daily.    Yes [provider]  loperamide (IMODIUM) 2 MG capsule Take 2 mg by mouth as needed for diarrhea or loose stools.    Yes [provider]  magnesium oxide (MAG-OX) 400 MG tablet TAKE 1 TABLET BY MOUTH TWICE A DAY 10/04/17  Yes Hubbard Hartshorn, NP  metFORMIN (GLUCOPHAGE) 1000 MG tablet Take 1,000 mg  by mouth 2 (two) times daily with a meal.    Yes [provider]  Omega-3 Fatty Acids (FISH OIL) 1000 MG CAPS Take 1,000 mg by mouth daily.    Yes [provider]  ondansetron (ZOFRAN) 8 MG tablet Take 1 tablet (8 mg total) by mouth every 8 (eight) hours as needed for nausea or vomiting. 07/26/16  Yes Penland, Novella Olive, MD  ondansetron (ZOFRAN-ODT) 4 MG disintegrating tablet Take 4 mg by mouth every 8 (eight) hours as needed for nausea or vomiting.    Yes [provider]  Oxycodone HCl 20 MG TABS Take 1 tablet (20 mg total) by mouth every 12 (twelve) hours. For severe pain. 12/22/17  Yes Higgs, Minta Balsam, MD  oxyCODONE-acetaminophen (PERCOCET) 10-325 MG tablet Take 1 tablet by mouth every 4 (four) hours as needed for pain. 01/13/18  Yes Lockamy, Randi L, NP-C  Polyethylene Glycol 3350 (MIRALAX PO) Take 17 g by mouth daily as needed for moderate constipation.    Yes [provider]  prochlorperazine (COMPAZINE) 10 MG tablet TAKE 1 TABLET (10 MG TOTAL) BY MOUTH EVERY 6 (SIX) HOURS AS NEEDED FOR NAUSEA OR VOMITING. 06/28/17  Yes Hubbard Hartshorn, NP  simethicone (GAS-X) 80 MG chewable tablet Chew 80 mg by mouth every 6 (six) hours as needed for flatulence.   Yes [provider]    Physical Exam: Vitals:   01/22/18 1645 01/22/18 1700 01/22/18 1905 01/22/18 1910  BP:  (!) 159/90 (!) 146/73 (!) 146/73  Pulse: 89 95 92 92  Resp:    16  Temp:      SpO2: 96% 95% 94% 95%  Weight:      Height:        Constitutional: NAD, calm, comfortable Vitals:   01/22/18 1645 01/22/18 1700 01/22/18 1905 01/22/18 1910  BP:  (!) 159/90 (!) 146/73 (!) 146/73  Pulse: 89 95 92 92  Resp:    16  Temp:      SpO2: 96% 95% 94% 95%  Weight:      Height:       Eyes: PERRL, lids and conjunctivae normal ENMT: Mucous membranes are moist. Posterior pharynx clear of any exudate or lesions.Normal dentition.  Neck: normal, supple, no masses, no thyromegaly Respiratory: clear to  auscultation bilaterally, no wheezing, no crackles. Normal respiratory effort. No accessory muscle use.  Cardiovascular: Regular rate and rhythm, no murmurs / rubs / gallops. No extremity edema. 2+ pedal pulses. No carotid bruits.  Abdomen: no tenderness, no masses palpated. No hepatosplenomegaly. Bowel sounds positive.  Musculoskeletal: no clubbing / cyanosis. No joint deformity upper and lower extremities. Good ROM, no contractures. Normal muscle tone.  Skin: no rashes, lesions, ulcers. No induration Neurologic: CN 2-12 grossly intact. Sensation  intact, DTR normal. Strength 5/5 in all 4.  Psychiatric: Normal judgment and insight. Alert and oriented x 3. Normal mood.    Labs on Admission: I have personally reviewed following labs and imaging studies  CBC: Recent Labs  Lab 01/22/18 1416  WBC 8.5  NEUTROABS 6.1  HGB 11.4*  HCT 34.5*  MCV 99.4  PLT 160   Basic Metabolic Panel: Recent Labs  Lab 01/22/18 1416  NA 140  K 3.8  CL 103  CO2 27  GLUCOSE 173*  BUN 24*  CREATININE 1.29*  CALCIUM 10.5*   GFR: Estimated Creatinine Clearance: 54.8 mL/min (A) (by C-G formula based on SCr of 1.29 mg/dL (H)). Liver Function Tests: Recent Labs  Lab 01/22/18 1416  AST 18  ALT 13  ALKPHOS 54  BILITOT 0.6  PROT 6.9  ALBUMIN 3.1*   No results for input(s): LIPASE, AMYLASE in the last 168 hours. No results for input(s): AMMONIA in the last 168 hours. Coagulation Profile: No results for input(s): INR, PROTIME in the last 168 hours. Cardiac Enzymes: Recent Labs  Lab 01/22/18 1416  TROPONINI <0.03   BNP (last 3 results) No results for input(s): PROBNP in the last 8760 hours. HbA1C: No results for input(s): HGBA1C in the last 72 hours. CBG: No results for input(s): GLUCAP in the last 168 hours. Lipid Profile: No results for input(s): CHOL, HDL, LDLCALC, TRIG, CHOLHDL, LDLDIRECT in the last 72 hours. Thyroid Function Tests: No results for input(s): TSH, T4TOTAL, FREET4, T3FREE,  THYROIDAB in the last 72 hours. Anemia Panel: No results for input(s): VITAMINB12, FOLATE, FERRITIN, TIBC, IRON, RETICCTPCT in the last 72 hours. Urine analysis:    Component Value Date/Time   COLORURINE YELLOW 01/22/2018 1826   APPEARANCEUR CLEAR 01/22/2018 1826   LABSPEC 1.042 (H) 01/22/2018 1826   PHURINE 5.0 01/22/2018 1826   GLUCOSEU NEGATIVE 01/22/2018 1826   HGBUR NEGATIVE 01/22/2018 1826   BILIRUBINUR NEGATIVE 01/22/2018 1826   KETONESUR 5 (A) 01/22/2018 1826   PROTEINUR NEGATIVE 01/22/2018 1826   NITRITE NEGATIVE 01/22/2018 1826   LEUKOCYTESUR SMALL (A) 01/22/2018 1826   Sepsis Labs: !!!!!!!!!!!!!!!!!!!!!!!!!!!!!!!!!!!!!!!!!!!! @LABRCNTIP (procalcitonin:4,lacticidven:4) )No results found for this or any previous visit (from the past 240 hour(s)).   Radiological Exams on Admission: Ct Angio Chest Pe W And/or Wo Contrast  Result Date: 01/22/2018 CLINICAL DATA:  Shortness of breath and cough EXAM: CT ANGIOGRAPHY CHEST WITH CONTRAST TECHNIQUE: Multidetector CT imaging of the chest was performed using the standard protocol during bolus administration of intravenous contrast. Multiplanar CT image reconstructions and MIPs were obtained to evaluate the vascular anatomy. CONTRAST:  65mL ISOVUE-370 IOPAMIDOL (ISOVUE-370) INJECTION 76% COMPARISON:  Chest CT 01/12/2017 PET CT 01/09/2018 FINDINGS: Cardiovascular: --Pulmonary arteries: Contrast injection is sufficient to demonstrate satisfactory opacification of the pulmonary arteries to the segmental level. There is no pulmonary embolus. The main pulmonary artery is within normal limits for size. --Aorta: Limited opacification of the aorta due to bolus timing optimization for the pulmonary arteries. Conventional 3 vessel aortic branching pattern. The aortic course and caliber are normal. There is mild aortic atherosclerosis. --Heart: Normal size. No pericardial effusion. Mediastinum/Nodes: 2.1 cm node adjacent to the proximal left subclavian  artery is unchanged in size. There is no mediastinal lymphadenopathy. No axillary adenopathy. Normal thyroid. Normal course of the esophagus. Lungs/Pleura: No pleural effusion or focal consolidation. No pneumothorax. No pulmonary edema. Pulmonary nodules as follows: 1. Right upper lobe nodule (6:35), unchanged. 2. Clustered nodes in the right upper lobe (6:44) measuring 11 mm, unchanged. 3. 12  mm left upper lobe nodule (6:57), unchanged. 4. Slightly lower left upper lobe nodule (6:64), 10 mm, unchanged. 5. Anterior lingula nodule, 8 mm, unchanged (6: 77). 6. Medial right lower lobe nodule, 6 mm, unchanged (6:88). Upper Abdomen: Contrast bolus timing is not optimized for evaluation of the abdominal organs. Unchanged 3.1 cm partially calcified mass adjacent to the second portion of duodenum. Musculoskeletal: No chest wall abnormality. No acute or significant osseous findings. Review of the MIP images confirms the above findings. IMPRESSION: 1. No pulmonary embolus. 2. Unchanged appearance of multiple pulmonary nodules compared to PET/CT from 01/09/2018. 3. Unchanged partially calcified para duodenal mass. 4. Unchanged appearance of 2.1 cm left supraclavicular node adjacent to the proximal left subclavian artery. 5.  Aortic Atherosclerosis (ICD10-I70.0). Electronically Signed   By: Deatra Robinson M.D.   On: 01/22/2018 17:36   Dg Chest Port 1 View  Result Date: 01/22/2018 CLINICAL DATA:  Shortness of breath EXAM: PORTABLE CHEST 1 VIEW COMPARISON:  Chest radiograph September 10, 2016 and chest CT January 12, 2017 FINDINGS: Port-A-Cath tip is in the right atrium slightly beyond the cavoatrial junction. No pneumothorax. There is no evident edema or consolidation. Heart size and pulmonary vascularity are normal. No adenopathy. No evident bone lesions. IMPRESSION: Port-A-Cath as described without pneumothorax. No edema or consolidation. Stable cardiac silhouette. Electronically Signed   By: Bretta Bang III M.D.   On:  01/22/2018 14:42    EKG: Independently reviewed. 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  Suspect this is reason to her worsening sob.  Lungs are clear.  Not volume overloaded.  Discussed with her oncologist Friday who are considering starting her on new oral chemo regimen.  Cont outpt follow up.  Active Problems:   Type 2 diabetes mellitus (HCC)- cont home meds x metformin   Obesity (BMI 30-39.9)- noted   Dyspnea- serial trop overnight.  Af with normal oxygen sats on RA.  Echo.     DVT prophylaxis:  scds Code Status:  full Family Communication: husband Disposition Plan:  Likely tom Consults called:  none Admission status: observation   Nelta Caudill A MD [facility]  If 7PM-7AM, please contact night-coverage [email or web address] Password [facility]  01/22/2018, 7:12 PM

## 2018-01-22 NOTE — ED Notes (Signed)
Ginger ale given per pt request. 

## 2018-01-22 NOTE — ED Notes (Signed)
Pt complains of difficulty breathing  Her sats on RA are 100 per cent  She reports breathing was worse after treatment   Dr Laverta Baltimore to bedside to assess

## 2018-01-22 NOTE — ED Provider Notes (Signed)
Blood pressure (!) 152/60, pulse 90, temperature 97.6 F (36.4 C), resp. rate 15, height _0  (1.753 m), weight 108.4 kg (239 lb), SpO2 97 %.  Assuming care from Dr. Dayna Barker.  In short, Kathleen Whitehead is a 67 y.o. female with a chief complaint of Shortness of Breath .  Refer to the original H&P for additional details.  The current plan of care is to f/u on CTA and reassess.   EKG Interpretation  Date/Time:  Sunday January 22 2018 14:03:19 EDT Ventricular Rate:  85 PR Interval:    QRS Duration: 88 QT Interval:  332 QTC Calculation: 395 R Axis:   4 Text Interpretation:  Sinus rhythm Low voltage, precordial leads Minimal ST depression, lateral leads No old tracing to compare Confirmed by Merrily Pew [phone]) on 01/22/2018 2:47:22 PM      CTA with no PE. Unclear etiology of SOB symptoms. Likely met related but would consider enzyme trending and ECHO.   Discussed patient's case with Hospitalist, Dr. Shanon Brow to request admission. Patient and family (if present) updated with plan. Care transferred to Hospitalist service.  I reviewed all nursing notes, vitals, pertinent old records, EKGs, labs, imaging (as available).   Nanda Quinton, MD    Margette Fast, MD 01/23/18 [phone]

## 2018-01-23 ENCOUNTER — Other Ambulatory Visit: Payer: Self-pay

## 2018-01-23 ENCOUNTER — Observation Stay (HOSPITAL_BASED_OUTPATIENT_CLINIC_OR_DEPARTMENT_OTHER): Payer: Medicare HMO

## 2018-01-23 ENCOUNTER — Encounter (HOSPITAL_COMMUNITY): Payer: Self-pay

## 2018-01-23 DIAGNOSIS — I5030 Unspecified diastolic (congestive) heart failure: Secondary | ICD-10-CM

## 2018-01-23 DIAGNOSIS — R0602 Shortness of breath: Secondary | ICD-10-CM | POA: Diagnosis not present

## 2018-01-23 DIAGNOSIS — R0789 Other chest pain: Secondary | ICD-10-CM | POA: Diagnosis not present

## 2018-01-23 DIAGNOSIS — E669 Obesity, unspecified: Secondary | ICD-10-CM | POA: Diagnosis not present

## 2018-01-23 DIAGNOSIS — C569 Malignant neoplasm of unspecified ovary: Secondary | ICD-10-CM | POA: Diagnosis not present

## 2018-01-23 DIAGNOSIS — I5031 Acute diastolic (congestive) heart failure: Secondary | ICD-10-CM | POA: Diagnosis not present

## 2018-01-23 LAB — GLUCOSE, CAPILLARY: Glucose-Capillary: 151 mg/dL — ABNORMAL HIGH (ref 70–99)

## 2018-01-23 LAB — TROPONIN I: Troponin I: <0.03 ng/mL (ref <0.03)

## 2018-01-23 LAB — ECHOCARDIOGRAM COMPLETE
Height: 69 in
WEIGHTICAEL: 3576 [oz_av]

## 2018-01-23 MED ORDER — ALPRAZOLAM 0.25 MG PO TABS: 0.2500 mg | ORAL_TABLET | Freq: Once | ORAL | Status: DC

## 2018-01-23 MED ORDER — FUROSEMIDE 10 MG/ML IJ SOLN
20.0000 mg | Freq: Once | INTRAMUSCULAR | Status: AC
  Administered 2018-01-23: 20 mg via INTRAVENOUS
  Filled 2018-01-23: qty 2

## 2018-01-23 MED ORDER — INSULIN ASPART 100 UNIT/ML ~~LOC~~ SOLN: 0.0000 [IU] | Freq: Three times a day (TID) | SUBCUTANEOUS | Status: DC

## 2018-01-23 NOTE — Care Management Obs Status (Signed)
[facility] NOTIFICATION   Patient Details  Name: Kathleen Whitehead MRN: [id number] Date of Birth: 02/25/1951   Medicare Observation Status Notification Given:  Yes    Sherald Barge, RN 01/23/2018, 1:45 PM

## 2018-01-23 NOTE — Progress Notes (Signed)
*  PRELIMINARY RESULTS* Echocardiogram 2D Echocardiogram has been performed.  Samuel Germany 01/23/2018, 2:54 PM

## 2018-01-23 NOTE — Progress Notes (Signed)
PROGRESS NOTE  Kathleen Whitehead ZSW:[id number] DOB: 02-07-1951 DOA: 01/22/2018 PCP: [name], MD  Brief History:  66 year old female with a history of metastatic ovarian cancer, hypertension, diabetes mellitus presenting with 1 day history of acute shortness of breath that began on the morning of 01/22/2018 when she woke up.  The patient stated that lasted several hours without any associated chest pain complaints of confusion chest pain upon presentation in the emergency department, BMP, CBC were essentially unremarkable.  CT angiogram of the chest was negative for pulmonary embolus and showed unchanged thoracic lymph adenopathy when compared to previous PET scan on January 10, 2018.  CTA of the chest was also negative for any consolidations, pleural effusions, or pulmonary edema.  Since admission, the patient has remained clinically stable without any hypoxia.  Echocardiogram was obtained.  Patient was given furosemide 20 mg IV x1 for her lower extremity edema.    Assessment/Plan: Dyspnea -Likely multifactorial including the patient's metastatic ovarian cancer, anemia, and suspected mild degree of Right sided CHF -stable on RA -01/23/18 Echo--EF 55-60%, grade 1 DD, could not estimate PASP -lasix 20 mg IV x 1 -am BMP  Metastatic ovarian cancer -Patient follows at [facility] -Diagnosed April 30, 2013 -Last chemotherapy January 2018 through April 2018  Diabetes mellitus type 2 -discontinue glipizide -A1C -novolog sliding scale  Chronic pain syndrome -Continue home dose oxycodone  Essential hypertension -Continue lisinopril  Seizure disorder -Continue Keppra    Disposition Plan:   Home 7/23 if stable Family Communication:  No Family at bedside  Consultants:  none  Code Status:  FULL  DVT Prophylaxis:  SCDs   Procedures: As Listed in Progress Note Above  Antibiotics: None    Subjective: Patient denies fevers, chills, headache,  chest pain, dyspnea, nausea, vomiting, diarrhea, abdominal pain, dysuria, hematuria, hematochezia, and melena.   Objective: Vitals:   01/22/18 2048 01/22/18 2121 01/23/18 0518 01/23/18 1500  BP: (!) 179/76 (!) 179/76 (!) 159/78 (!) 145/59  Pulse: 100  91 84  Resp: 17  16 19   Temp: 98.4 F (36.9 C)  98.6 F (37 C) 97.8 F (36.6 C)  TempSrc: Oral  Oral Oral  SpO2: 98%  94% 98%  Weight: 101.4 kg (223 lb 8 oz)     Height:        Intake/Output Summary (Last 24 hours) at 01/23/2018 1807 Last data filed at 01/23/2018 1500 Gross per 24 hour  Intake 1080 ml  Output 1400 ml  Net -320 ml   Weight change:  Exam:   General:  Pt is alert, follows commands appropriately, not in acute distress  HEENT: No icterus, No thrush, No neck mass, [place]/AT  Cardiovascular: RRR, S1/S2, no rubs, no gallops  Respiratory: CTA bilaterally, no wheezing, no crackles, no rhonchi  Abdomen: Soft/+BS, non tender, non distended, no guarding  Extremities: 2 +LE edema, No lymphangitis, No petechiae, No rashes, no synovitis   Data Reviewed: I have personally reviewed following labs and imaging studies Basic Metabolic Panel: Recent Labs  Lab 01/22/18 1416  NA 140  K 3.8  CL 103  CO2 27  GLUCOSE 173*  BUN 24*  CREATININE 1.29*  CALCIUM 10.5*   Liver Function Tests: Recent Labs  Lab 01/22/18 1416  AST 18  ALT 13  ALKPHOS 54  BILITOT 0.6  PROT 6.9  ALBUMIN 3.1*   No results for input(s): LIPASE, AMYLASE in the last 168 hours. No results for input(s): AMMONIA  in the last 168 hours. Coagulation Profile: No results for input(s): INR, PROTIME in the last 168 hours. CBC: Recent Labs  Lab 01/22/18 1416  WBC 8.5  NEUTROABS 6.1  HGB 11.4*  HCT 34.5*  MCV 99.4  PLT 160   Cardiac Enzymes: Recent Labs  Lab 01/22/18 1416 01/22/18 1843 01/23/18 0132 01/23/18 0704  TROPONINI <0.03 <0.03 <0.03 <0.03   BNP: Invalid input(s): POCBNP CBG: Recent Labs  Lab 01/23/18 1605  GLUCAP 151*    HbA1C: No results for input(s): HGBA1C in the last 72 hours. Urine analysis:    Component Value Date/Time   COLORURINE YELLOW 01/22/2018 1826   APPEARANCEUR CLEAR 01/22/2018 1826   LABSPEC 1.042 (H) 01/22/2018 1826   PHURINE 5.0 01/22/2018 1826   GLUCOSEU NEGATIVE 01/22/2018 1826   HGBUR NEGATIVE 01/22/2018 1826   BILIRUBINUR NEGATIVE 01/22/2018 1826   KETONESUR 5 (A) 01/22/2018 1826   PROTEINUR NEGATIVE 01/22/2018 1826   NITRITE NEGATIVE 01/22/2018 1826   LEUKOCYTESUR SMALL (A) 01/22/2018 1826   Sepsis Labs: @LABRCNTIP (procalcitonin:4,lacticidven:4) )No results found for this or any previous visit (from the past 240 hour(s)).   Scheduled Meds: . ALPRAZolam  0.25 mg Oral Once  . aspirin EC  325 mg Oral Daily  . glipiZIDE  10 mg Oral BID AC  . levETIRAcetam  500 mg Oral BID  . lisinopril  40 mg Oral Daily  . magnesium oxide  400 mg Oral BID  . sodium chloride flush  3 mL Intravenous Q12H  . cyanocobalamin  1,000 mcg Oral Daily   Continuous Infusions: . sodium chloride      Procedures/Studies: Ct Angio Chest Pe W And/or Wo Contrast  Result Date: 01/22/2018 CLINICAL DATA:  Shortness of breath and cough EXAM: CT ANGIOGRAPHY CHEST WITH CONTRAST TECHNIQUE: Multidetector CT imaging of the chest was performed using the standard protocol during bolus administration of intravenous contrast. Multiplanar CT image reconstructions and MIPs were obtained to evaluate the vascular anatomy. CONTRAST:  10mL ISOVUE-370 IOPAMIDOL (ISOVUE-370) INJECTION 76% COMPARISON:  Chest CT 01/12/2017 PET CT 01/09/2018 FINDINGS: Cardiovascular: --Pulmonary arteries: Contrast injection is sufficient to demonstrate satisfactory opacification of the pulmonary arteries to the segmental level. There is no pulmonary embolus. The main pulmonary artery is within normal limits for size. --Aorta: Limited opacification of the aorta due to bolus timing optimization for the pulmonary arteries. Conventional 3 vessel  aortic branching pattern. The aortic course and caliber are normal. There is mild aortic atherosclerosis. --Heart: Normal size. No pericardial effusion. Mediastinum/Nodes: 2.1 cm node adjacent to the proximal left subclavian artery is unchanged in size. There is no mediastinal lymphadenopathy. No axillary adenopathy. Normal thyroid. Normal course of the esophagus. Lungs/Pleura: No pleural effusion or focal consolidation. No pneumothorax. No pulmonary edema. Pulmonary nodules as follows: 1. Right upper lobe nodule (6:35), unchanged. 2. Clustered nodes in the right upper lobe (6:44) measuring 11 mm, unchanged. 3. 12 mm left upper lobe nodule (6:57), unchanged. 4. Slightly lower left upper lobe nodule (6:64), 10 mm, unchanged. 5. Anterior lingula nodule, 8 mm, unchanged (6: 77). 6. Medial right lower lobe nodule, 6 mm, unchanged (6:88). Upper Abdomen: Contrast bolus timing is not optimized for evaluation of the abdominal organs. Unchanged 3.1 cm partially calcified mass adjacent to the second portion of duodenum. Musculoskeletal: No chest wall abnormality. No acute or significant osseous findings. Review of the MIP images confirms the above findings. IMPRESSION: 1. No pulmonary embolus. 2. Unchanged appearance of multiple pulmonary nodules compared to PET/CT from 01/09/2018. 3. Unchanged partially calcified para duodenal  mass. 4. Unchanged appearance of 2.1 cm left supraclavicular node adjacent to the proximal left subclavian artery. 5.  Aortic Atherosclerosis (ICD10-I70.0). Electronically Signed   By: Ulyses Jarred M.D.   On: 01/22/2018 17:36   Nm Pet Image Restag (ps) Skull Base To Thigh  Result Date: 01/10/2018 CLINICAL DATA:  Subsequent treatment strategy for metastatic endometrial and ovarian cancer. EXAM: NUCLEAR MEDICINE PET SKULL BASE TO THIGH TECHNIQUE: 15.2 mCi F-18 FDG was injected intravenously. Full-ring PET imaging was performed from the skull base to thigh after the radiotracer. CT data was obtained  and used for attenuation correction and anatomic localization. Fasting blood glucose: 220 mg/dl COMPARISON:  PET-CT 11/02/2016 FINDINGS: Mediastinal blood pool activity: SUV max 3.47 NECK: Left supraclavicular lymph node measures 2.2 cm and has an SUV max of 9.58. Previously 1.2 cm within SUV max of 12.01. No hypermetabolic mediastinal or hilar lymph nodes. Incidental CT findings: none CHEST: Posterior left upper lobe lung nodule measures 1 cm and has an SUV max of 4.08. New from previous exam. Adjacent nodule measures 0.9 cm and measures 3.65. Also new from previous exam. Right upper lobe pulmonary nodule measures 1.1 cm and has an SUV max of 2.6. New from previous exam. Incidental CT findings: Aortic atherosclerosis noted. Calcifications within the LAD, left circumflex coronary arteries noted. ABDOMEN/PELVIS: No abnormal FDG uptake identified within the liver, pancreas, or spleen. The adrenal glands are unremarkable. Aortocaval retroperitoneal lymph node is new measuring 1.4 cm within SUV max of 6.24. More distally there is an aortocaval node measuring 2.1 cm within SUV max of 6.2. Previously 1.8 cm within SUV max of 10.26. Incidental CT findings: Aortic atherosclerosis without aneurysm. Calcified mesenteric mass has migrated into the right abdomen. This is unchanged measuring 3.2 cm without significant FDG uptake. SKELETON: No focal hypermetabolic activity to suggest skeletal metastasis. Incidental CT findings: none IMPRESSION: 1. Interval progression of disease. Increase in size of left supraclavicular hypermetabolic lymph node. There are 3 new lung nodules identified within both lungs compatible with pulmonary metastasis. Progression of retroperitoneal aortocaval adenopathy. 2. Aortic atherosclerosis and multi vessel coronary artery atherosclerotic calcifications. Electronically Signed   By: Kerby Moors M.D.   On: 01/10/2018 09:04   Dg Chest Port 1 View  Result Date: 01/22/2018 CLINICAL DATA:  Shortness  of breath EXAM: PORTABLE CHEST 1 VIEW COMPARISON:  Chest radiograph September 10, 2016 and chest CT January 12, 2017 FINDINGS: Port-A-Cath tip is in the right atrium slightly beyond the cavoatrial junction. No pneumothorax. There is no evident edema or consolidation. Heart size and pulmonary vascularity are normal. No adenopathy. No evident bone lesions. IMPRESSION: Port-A-Cath as described without pneumothorax. No edema or consolidation. Stable cardiac silhouette. Electronically Signed   By: Lowella Grip III M.D.   On: 01/22/2018 14:42    Orson Eva, DO  [facility] Pager [phone]  If 7PM-7AM, please contact night-coverage [email or web address] Password [email or web address] 01/23/2018, 6:07 PM   LOS: 0 days

## 2018-01-24 DIAGNOSIS — R0602 Shortness of breath: Secondary | ICD-10-CM | POA: Diagnosis not present

## 2018-01-24 DIAGNOSIS — I5031 Acute diastolic (congestive) heart failure: Secondary | ICD-10-CM

## 2018-01-24 DIAGNOSIS — R0789 Other chest pain: Secondary | ICD-10-CM

## 2018-01-24 DIAGNOSIS — N183 Chronic kidney disease, stage 3 unspecified: Secondary | ICD-10-CM

## 2018-01-24 DIAGNOSIS — C569 Malignant neoplasm of unspecified ovary: Secondary | ICD-10-CM | POA: Diagnosis not present

## 2018-01-24 DIAGNOSIS — E669 Obesity, unspecified: Secondary | ICD-10-CM | POA: Diagnosis not present

## 2018-01-24 LAB — CBC
HCT: 32.9 % — ABNORMAL LOW (ref 36.0–46.0)
Hemoglobin: 10.7 g/dL — ABNORMAL LOW (ref 12.0–15.0)
MCH: 32.4 pg (ref 26.0–34.0)
MCHC: 32.5 g/dL (ref 30.0–36.0)
MCV: 99.7 fL (ref 78.0–100.0)
PLATELETS: 152 10*3/uL (ref 150–400)
RBC: 3.3 MIL/uL — ABNORMAL LOW (ref 3.87–5.11)
RDW: 13.4 % (ref 11.5–15.5)
WBC: 8.6 10*3/uL (ref 4.0–10.5)

## 2018-01-24 LAB — BASIC METABOLIC PANEL
Anion gap: 6 (ref 5–15)
BUN: 28 mg/dL — AB (ref 8–23)
CALCIUM: 9.2 mg/dL (ref 8.9–10.3)
CHLORIDE: 105 mmol/L (ref 98–111)
CO2: 30 mmol/L (ref 22–32)
CREATININE: 1.35 mg/dL — AB (ref 0.44–1.00)
GFR calc Af Amer: 46 mL/min — ABNORMAL LOW (ref >60)
GFR calc non Af Amer: 39 mL/min — ABNORMAL LOW (ref >60)
GLUCOSE: 152 mg/dL — AB (ref 70–99)
Potassium: 3.7 mmol/L (ref 3.5–5.1)
Sodium: 141 mmol/L (ref 135–145)

## 2018-01-24 LAB — GLUCOSE, CAPILLARY
Glucose-Capillary: 111 mg/dL — ABNORMAL HIGH (ref 70–99)
Glucose-Capillary: 167 mg/dL — ABNORMAL HIGH (ref 70–99)

## 2018-01-24 LAB — HEMOGLOBIN A1C
Hgb A1c MFr Bld: 7.2 % — ABNORMAL HIGH (ref 4.8–5.6)
Mean Plasma Glucose: 159.94 mg/dL

## 2018-01-24 LAB — MAGNESIUM: Magnesium: 1.7 mg/dL (ref 1.7–2.4)

## 2018-01-24 MED ORDER — FUROSEMIDE 20 MG PO TABS: 20.0000 mg | ORAL_TABLET | Freq: Every day | ORAL | Status: DC

## 2018-01-24 MED ORDER — FUROSEMIDE 20 MG PO TABS: 20.0000 mg | ORAL_TABLET | Freq: Every day | ORAL | 1 refills | Status: DC

## 2018-01-24 NOTE — Discharge Summary (Signed)
Physician Discharge Summary  Kathleen Whitehead FIE:[id number] DOB: [birth year] DOA: 01/22/2018  PCP: [name], MD  Admit date: 01/22/2018 Discharge date: 01/24/2018  Admitted From: Home Disposition:  Home   Recommendations for Outpatient Follow-up:  1. Follow up with PCP in 1-2 weeks 2. Please obtain BMP/CBC in one week     Discharge Condition: Stable CODE STATUS: FULL Diet recommendation: Heart Healthy   Brief/Interim Summary: 67 year old female with a history of metastatic ovarian cancer, hypertension, diabetes mellitus presenting with 1 day history of acute shortness of breath that began on the morning of 01/22/2018 when she woke up. The patient stated that lasted several hours without any associated chest pain.  She also had some PND type symptoms as she had been waking up from sleep with dyspnea.  In the emergency department, BMP, CBC were essentially unremarkable. CT angiogram of the chest was negative for pulmonary embolus and showed unchanged thoracic lymph adenopathy when compared to previous PET scan on January 10, 2018. CTA of the chest was also negative for any consolidations, pleural effusions, or pulmonary edema. Since admission, the patient has remained clinically stable without any hypoxia. Echocardiogram was obtained. Patient was given furosemide 20 mg IV with clinical improvement.  The patient did not want to stay in hospital any longer as she was clinically improved with no oxygen requirement.  She will be d/c with furosemide 20 mg daily.  I instructed patient to follow up with PCP for BMP in one week.   Discharge Diagnoses:  Dyspnea -Likely multifactorial including the patient's metastatic ovarian cancer, anemia, and  mild degree ofRight sided CHF -stable on RA -01/23/18 Echo--EF 55-60%, grade 1 DD, could not estimate PASP -lasix 20 mg IV x 1>>>home with lasix 20 mg po daily -BMP one week after d/c  Acute Diastolic CHF -R-sided CHF from likely undiagnosed  OHS/OSA -stable on RA -d/c weight 223 -home with lasix 20 mg po daily -repeat BMP in one week  Metastatic ovarian cancer -Patient follows [facility] -Diagnosed April 30, 2013 -Last chemotherapy January 2018 through April 2018 -plans to start hormonal tx as pt could not tolerate chemo previously  Diabetes mellitus type 2 -discontinue glipizide>>>restart after d/c -A1C--pending at time of d/c -novolog sliding scale  CKD stage 3 -baseline creatinine 1.0-1.3 -monitor with furosemide  Chronic pain syndrome -Continue home dose oxycodone  Essential hypertension -Continue lisinopril  Seizure disorder -Continue Keppra     Discharge Instructions   Allergies as of 01/24/2018      Reactions   Diphenhydramine Palpitations   Iodinated Diagnostic Agents Other (See Comments)   Unknown   Duloxetine Hcl Nausea And Vomiting   Ibuprofen Other (See Comments)   Makes my bones hurt   Penicillins Rash   Has patient had a PCN reaction causing immediate rash, facial/tongue/throat swelling, SOB or lightheadedness with hypotension: No Has patient had a PCN reaction causing severe rash involving mucus membranes or skin necrosis: No Has patient had a PCN reaction that required hospitalization: No Has patient had a PCN reaction occurring within the last 10 years: No If all of the above answers are "NO", then may proceed with Cephalosporin use.      Medication List    TAKE these medications   acetaminophen 500 MG tablet Commonly known as:  TYLENOL Take 500 mg by mouth every 6 (six) hours as needed for mild pain or moderate pain.   aspirin EC 325 MG tablet Take 325 mg by mouth daily.   cetirizine 10 MG  tablet Commonly known as:  ZYRTEC Take 10 mg by mouth daily.   cyanocobalamin 1000 MCG tablet Take 1,000 mcg by mouth daily.   Fish Oil 1000 MG Caps Take 1,000 mg by mouth daily.   furosemide 20 MG tablet Commonly known as:  LASIX Take 1 tablet (20 mg  total) by mouth daily.   GAS-X 80 MG chewable tablet Generic drug:  simethicone Chew 80 mg by mouth every 6 (six) hours as needed for flatulence.   glipiZIDE 10 MG tablet Commonly known as:  GLUCOTROL TAKE 1 TABLET BY MOUTH TWICE A DAY   levETIRAcetam 500 MG tablet Commonly known as:  KEPPRA Take 1 tablet (500 mg total) by mouth 2 (two) times daily.   lisinopril 40 MG tablet Commonly known as:  PRINIVIL,ZESTRIL Take 40 mg by mouth daily.   loperamide 2 MG capsule Commonly known as:  IMODIUM Take 2 mg by mouth as needed for diarrhea or loose stools.   magnesium oxide 400 MG tablet Commonly known as:  MAG-OX TAKE 1 TABLET BY MOUTH TWICE A DAY   metFORMIN 1000 MG tablet Commonly known as:  GLUCOPHAGE Take 1,000 mg by mouth 2 (two) times daily with a meal.   MIRALAX PO Take 17 g by mouth daily as needed for moderate constipation.   ondansetron 4 MG disintegrating tablet Commonly known as:  ZOFRAN-ODT Take 4 mg by mouth every 8 (eight) hours as needed for nausea or vomiting.   ondansetron 8 MG tablet Commonly known as:  ZOFRAN Take 1 tablet (8 mg total) by mouth every 8 (eight) hours as needed for nausea or vomiting.   Oxycodone HCl 20 MG Tabs Take 1 tablet (20 mg total) by mouth every 12 (twelve) hours. For severe pain.   oxyCODONE-acetaminophen 10-325 MG tablet Commonly known as:  PERCOCET Take 1 tablet by mouth every 4 (four) hours as needed for pain.   prochlorperazine 10 MG tablet Commonly known as:  COMPAZINE TAKE 1 TABLET (10 MG TOTAL) BY MOUTH EVERY 6 (SIX) HOURS AS NEEDED FOR NAUSEA OR VOMITING.   TUMS SMOOTHIES 750 MG chewable tablet Generic drug:  calcium carbonate Chew 1 tablet by mouth daily as needed for heartburn.   calcium carbonate 750 MG chewable tablet Commonly known as:  TUMS EX Chew 1 tablet by mouth daily as needed for heartburn.   VITAMIN D PO Take 1 tablet by mouth daily.       Allergies  Allergen Reactions  . Diphenhydramine  Palpitations  . Iodinated Diagnostic Agents Other (See Comments)    Unknown  . Duloxetine Hcl Nausea And Vomiting  . Ibuprofen Other (See Comments)    Makes my bones hurt  . Penicillins Rash    Has patient had a PCN reaction causing immediate rash, facial/tongue/throat swelling, SOB or lightheadedness with hypotension: No Has patient had a PCN reaction causing severe rash involving mucus membranes or skin necrosis: No Has patient had a PCN reaction that required hospitalization: No Has patient had a PCN reaction occurring within the last 10 years: No If all of the above answers are "NO", then may proceed with Cephalosporin use.     Consultations:  none   Procedures/Studies: Ct Angio Chest Pe W And/or Wo Contrast  Result Date: 01/22/2018 CLINICAL DATA:  Shortness of breath and cough EXAM: CT ANGIOGRAPHY CHEST WITH CONTRAST TECHNIQUE: Multidetector CT imaging of the chest was performed using the standard protocol during bolus administration of intravenous contrast. Multiplanar CT image reconstructions and MIPs were obtained to evaluate  the vascular anatomy. CONTRAST:  61mL ISOVUE-370 IOPAMIDOL (ISOVUE-370) INJECTION 76% COMPARISON:  Chest CT 01/12/2017 PET CT 01/09/2018 FINDINGS: Cardiovascular: --Pulmonary arteries: Contrast injection is sufficient to demonstrate satisfactory opacification of the pulmonary arteries to the segmental level. There is no pulmonary embolus. The main pulmonary artery is within normal limits for size. --Aorta: Limited opacification of the aorta due to bolus timing optimization for the pulmonary arteries. Conventional 3 vessel aortic branching pattern. The aortic course and caliber are normal. There is mild aortic atherosclerosis. --Heart: Normal size. No pericardial effusion. Mediastinum/Nodes: 2.1 cm node adjacent to the proximal left subclavian artery is unchanged in size. There is no mediastinal lymphadenopathy. No axillary adenopathy. Normal thyroid. Normal course  of the esophagus. Lungs/Pleura: No pleural effusion or focal consolidation. No pneumothorax. No pulmonary edema. Pulmonary nodules as follows: 1. Right upper lobe nodule (6:35), unchanged. 2. Clustered nodes in the right upper lobe (6:44) measuring 11 mm, unchanged. 3. 12 mm left upper lobe nodule (6:57), unchanged. 4. Slightly lower left upper lobe nodule (6:64), 10 mm, unchanged. 5. Anterior lingula nodule, 8 mm, unchanged (6: 77). 6. Medial right lower lobe nodule, 6 mm, unchanged (6:88). Upper Abdomen: Contrast bolus timing is not optimized for evaluation of the abdominal organs. Unchanged 3.1 cm partially calcified mass adjacent to the second portion of duodenum. Musculoskeletal: No chest wall abnormality. No acute or significant osseous findings. Review of the MIP images confirms the above findings. IMPRESSION: 1. No pulmonary embolus. 2. Unchanged appearance of multiple pulmonary nodules compared to PET/CT from 01/09/2018. 3. Unchanged partially calcified para duodenal mass. 4. Unchanged appearance of 2.1 cm left supraclavicular node adjacent to the proximal left subclavian artery. 5.  Aortic Atherosclerosis (ICD10-I70.0). Electronically Signed   By: Ulyses Jarred M.D.   On: 01/22/2018 17:36   Nm Pet Image Restag (ps) Skull Base To Thigh  Result Date: 01/10/2018 CLINICAL DATA:  Subsequent treatment strategy for metastatic endometrial and ovarian cancer. EXAM: NUCLEAR MEDICINE PET SKULL BASE TO THIGH TECHNIQUE: 15.2 mCi F-18 FDG was injected intravenously. Full-ring PET imaging was performed from the skull base to thigh after the radiotracer. CT data was obtained and used for attenuation correction and anatomic localization. Fasting blood glucose: 220 mg/dl COMPARISON:  PET-CT 11/02/2016 FINDINGS: Mediastinal blood pool activity: SUV max 3.47 NECK: Left supraclavicular lymph node measures 2.2 cm and has an SUV max of 9.58. Previously 1.2 cm within SUV max of 12.01. No hypermetabolic mediastinal or hilar  lymph nodes. Incidental CT findings: none CHEST: Posterior left upper lobe lung nodule measures 1 cm and has an SUV max of 4.08. New from previous exam. Adjacent nodule measures 0.9 cm and measures 3.65. Also new from previous exam. Right upper lobe pulmonary nodule measures 1.1 cm and has an SUV max of 2.6. New from previous exam. Incidental CT findings: Aortic atherosclerosis noted. Calcifications within the LAD, left circumflex coronary arteries noted. ABDOMEN/PELVIS: No abnormal FDG uptake identified within the liver, pancreas, or spleen. The adrenal glands are unremarkable. Aortocaval retroperitoneal lymph node is new measuring 1.4 cm within SUV max of 6.24. More distally there is an aortocaval node measuring 2.1 cm within SUV max of 6.2. Previously 1.8 cm within SUV max of 10.26. Incidental CT findings: Aortic atherosclerosis without aneurysm. Calcified mesenteric mass has migrated into the right abdomen. This is unchanged measuring 3.2 cm without significant FDG uptake. SKELETON: No focal hypermetabolic activity to suggest skeletal metastasis. Incidental CT findings: none IMPRESSION: 1. Interval progression of disease. Increase in size of left supraclavicular hypermetabolic  lymph node. There are 3 new lung nodules identified within both lungs compatible with pulmonary metastasis. Progression of retroperitoneal aortocaval adenopathy. 2. Aortic atherosclerosis and multi vessel coronary artery atherosclerotic calcifications. Electronically Signed   By: Kerby Moors M.D.   On: 01/10/2018 09:04   Dg Chest Port 1 View  Result Date: 01/22/2018 CLINICAL DATA:  Shortness of breath EXAM: PORTABLE CHEST 1 VIEW COMPARISON:  Chest radiograph September 10, 2016 and chest CT January 12, 2017 FINDINGS: Port-A-Cath tip is in the right atrium slightly beyond the cavoatrial junction. No pneumothorax. There is no evident edema or consolidation. Heart size and pulmonary vascularity are normal. No adenopathy. No evident bone  lesions. IMPRESSION: Port-A-Cath as described without pneumothorax. No edema or consolidation. Stable cardiac silhouette. Electronically Signed   By: Lowella Grip III M.D.   On: 01/22/2018 14:42         Discharge Exam: Vitals:   01/23/18 2311 01/24/18 0640  BP: (!) 143/58 (!) 140/55  Pulse: 71 61  Resp: 16 18  Temp: 98.2 F (36.8 C) 98.2 F (36.8 C)  SpO2: 97% 95%   Vitals:   01/23/18 1500 01/23/18 2002 01/23/18 2311 01/24/18 0640  BP: (!) 145/59  (!) 143/58 (!) 140/55  Pulse: 84  71 61  Resp: 19  16 18   Temp: 97.8 F (36.6 C)  98.2 F (36.8 C) 98.2 F (36.8 C)  TempSrc: Oral  Oral Oral  SpO2: 98% 97% 97% 95%  Weight:      Height:        General: Pt is alert, awake, not in acute distress Cardiovascular: RRR, S1/S2 +, no rubs, no gallops Respiratory: CTA bilaterally, no wheezing, no rhonchi Abdominal: Soft, NT, ND, bowel sounds + Extremities: 1+ LE edema, no cyanosis   The results of significant diagnostics from this hospitalization (including imaging, microbiology, ancillary and laboratory) are listed below for reference.    Significant Diagnostic Studies: Ct Angio Chest Pe W And/or Wo Contrast  Result Date: 01/22/2018 CLINICAL DATA:  Shortness of breath and cough EXAM: CT ANGIOGRAPHY CHEST WITH CONTRAST TECHNIQUE: Multidetector CT imaging of the chest was performed using the standard protocol during bolus administration of intravenous contrast. Multiplanar CT image reconstructions and MIPs were obtained to evaluate the vascular anatomy. CONTRAST:  86mL ISOVUE-370 IOPAMIDOL (ISOVUE-370) INJECTION 76% COMPARISON:  Chest CT 01/12/2017 PET CT 01/09/2018 FINDINGS: Cardiovascular: --Pulmonary arteries: Contrast injection is sufficient to demonstrate satisfactory opacification of the pulmonary arteries to the segmental level. There is no pulmonary embolus. The main pulmonary artery is within normal limits for size. --Aorta: Limited opacification of the aorta due to bolus  timing optimization for the pulmonary arteries. Conventional 3 vessel aortic branching pattern. The aortic course and caliber are normal. There is mild aortic atherosclerosis. --Heart: Normal size. No pericardial effusion. Mediastinum/Nodes: 2.1 cm node adjacent to the proximal left subclavian artery is unchanged in size. There is no mediastinal lymphadenopathy. No axillary adenopathy. Normal thyroid. Normal course of the esophagus. Lungs/Pleura: No pleural effusion or focal consolidation. No pneumothorax. No pulmonary edema. Pulmonary nodules as follows: 1. Right upper lobe nodule (6:35), unchanged. 2. Clustered nodes in the right upper lobe (6:44) measuring 11 mm, unchanged. 3. 12 mm left upper lobe nodule (6:57), unchanged. 4. Slightly lower left upper lobe nodule (6:64), 10 mm, unchanged. 5. Anterior lingula nodule, 8 mm, unchanged (6: 77). 6. Medial right lower lobe nodule, 6 mm, unchanged (6:88). Upper Abdomen: Contrast bolus timing is not optimized for evaluation of the abdominal organs. Unchanged 3.1 cm  partially calcified mass adjacent to the second portion of duodenum. Musculoskeletal: No chest wall abnormality. No acute or significant osseous findings. Review of the MIP images confirms the above findings. IMPRESSION: 1. No pulmonary embolus. 2. Unchanged appearance of multiple pulmonary nodules compared to PET/CT from 01/09/2018. 3. Unchanged partially calcified para duodenal mass. 4. Unchanged appearance of 2.1 cm left supraclavicular node adjacent to the proximal left subclavian artery. 5.  Aortic Atherosclerosis (ICD10-I70.0). Electronically Signed   By: Ulyses Jarred M.D.   On: 01/22/2018 17:36   Nm Pet Image Restag (ps) Skull Base To Thigh  Result Date: 01/10/2018 CLINICAL DATA:  Subsequent treatment strategy for metastatic endometrial and ovarian cancer. EXAM: NUCLEAR MEDICINE PET SKULL BASE TO THIGH TECHNIQUE: 15.2 mCi F-18 FDG was injected intravenously. Full-ring PET imaging was performed from  the skull base to thigh after the radiotracer. CT data was obtained and used for attenuation correction and anatomic localization. Fasting blood glucose: 220 mg/dl COMPARISON:  PET-CT 11/02/2016 FINDINGS: Mediastinal blood pool activity: SUV max 3.47 NECK: Left supraclavicular lymph node measures 2.2 cm and has an SUV max of 9.58. Previously 1.2 cm within SUV max of 12.01. No hypermetabolic mediastinal or hilar lymph nodes. Incidental CT findings: none CHEST: Posterior left upper lobe lung nodule measures 1 cm and has an SUV max of 4.08. New from previous exam. Adjacent nodule measures 0.9 cm and measures 3.65. Also new from previous exam. Right upper lobe pulmonary nodule measures 1.1 cm and has an SUV max of 2.6. New from previous exam. Incidental CT findings: Aortic atherosclerosis noted. Calcifications within the LAD, left circumflex coronary arteries noted. ABDOMEN/PELVIS: No abnormal FDG uptake identified within the liver, pancreas, or spleen. The adrenal glands are unremarkable. Aortocaval retroperitoneal lymph node is new measuring 1.4 cm within SUV max of 6.24. More distally there is an aortocaval node measuring 2.1 cm within SUV max of 6.2. Previously 1.8 cm within SUV max of 10.26. Incidental CT findings: Aortic atherosclerosis without aneurysm. Calcified mesenteric mass has migrated into the right abdomen. This is unchanged measuring 3.2 cm without significant FDG uptake. SKELETON: No focal hypermetabolic activity to suggest skeletal metastasis. Incidental CT findings: none IMPRESSION: 1. Interval progression of disease. Increase in size of left supraclavicular hypermetabolic lymph node. There are 3 new lung nodules identified within both lungs compatible with pulmonary metastasis. Progression of retroperitoneal aortocaval adenopathy. 2. Aortic atherosclerosis and multi vessel coronary artery atherosclerotic calcifications. Electronically Signed   By: Kerby Moors M.D.   On: 01/10/2018 09:04   Dg  Chest Port 1 View  Result Date: 01/22/2018 CLINICAL DATA:  Shortness of breath EXAM: PORTABLE CHEST 1 VIEW COMPARISON:  Chest radiograph September 10, 2016 and chest CT January 12, 2017 FINDINGS: Port-A-Cath tip is in the right atrium slightly beyond the cavoatrial junction. No pneumothorax. There is no evident edema or consolidation. Heart size and pulmonary vascularity are normal. No adenopathy. No evident bone lesions. IMPRESSION: Port-A-Cath as described without pneumothorax. No edema or consolidation. Stable cardiac silhouette. Electronically Signed   By: Lowella Grip III M.D.   On: 01/22/2018 14:42     Microbiology: No results found for this or any previous visit (from the past 240 hour(s)).   Labs: Basic Metabolic Panel: Recent Labs  Lab 01/22/18 1416 01/24/18 0528  NA 140 141  K 3.8 3.7  CL 103 105  CO2 27 30  GLUCOSE 173* 152*  BUN 24* 28*  CREATININE 1.29* 1.35*  CALCIUM 10.5* 9.2  MG  --  1.7  Liver Function Tests: Recent Labs  Lab 01/22/18 1416  AST 18  ALT 13  ALKPHOS 54  BILITOT 0.6  PROT 6.9  ALBUMIN 3.1*   No results for input(s): LIPASE, AMYLASE in the last 168 hours. No results for input(s): AMMONIA in the last 168 hours. CBC: Recent Labs  Lab 01/22/18 1416 01/24/18 0528  WBC 8.5 8.6  NEUTROABS 6.1  --   HGB 11.4* 10.7*  HCT 34.5* 32.9*  MCV 99.4 99.7  PLT 160 152   Cardiac Enzymes: Recent Labs  Lab 01/22/18 1416 01/22/18 1843 01/23/18 0132 01/23/18 0704  TROPONINI <0.03 <0.03 <0.03 <0.03   BNP: Invalid input(s): POCBNP CBG: Recent Labs  Lab 01/23/18 1605 01/24/18 0759  GLUCAP 151* 111*    Time coordinating discharge:  36 minutes  Signed:  Orson Eva, DO [facility] Pager: [phone] 01/24/2018, 11:45 AM

## 2018-02-03 ENCOUNTER — Other Ambulatory Visit (HOSPITAL_COMMUNITY): Payer: Self-pay | Admitting: *Deleted

## 2018-02-03 DIAGNOSIS — C569 Malignant neoplasm of unspecified ovary: Secondary | ICD-10-CM

## 2018-02-03 MED ORDER — OXYCODONE-ACETAMINOPHEN 10-325 MG PO TABS: 1.0000 | ORAL_TABLET | ORAL | 0 refills | Status: DC | PRN

## 2018-02-09 ENCOUNTER — Other Ambulatory Visit (HOSPITAL_COMMUNITY): Payer: Medicare HMO

## 2018-02-09 ENCOUNTER — Encounter (HOSPITAL_COMMUNITY): Payer: Medicare HMO | Admitting: Genetic Counselor

## 2018-02-18 ENCOUNTER — Other Ambulatory Visit: Payer: Self-pay

## 2018-02-18 ENCOUNTER — Emergency Department (HOSPITAL_COMMUNITY): Payer: Medicare HMO

## 2018-02-18 ENCOUNTER — Encounter (HOSPITAL_COMMUNITY): Payer: Self-pay | Admitting: Emergency Medicine

## 2018-02-18 ENCOUNTER — Emergency Department (HOSPITAL_COMMUNITY)
Admission: EM | Admit: 2018-02-18 | Discharge: 2018-02-18 | Disposition: A | Discharge status: Home / Self Care | Payer: Medicare HMO | Attending: Emergency Medicine | Admitting: Emergency Medicine

## 2018-02-18 DIAGNOSIS — Z8543 Personal history of malignant neoplasm of ovary: Secondary | ICD-10-CM | POA: Diagnosis not present

## 2018-02-18 DIAGNOSIS — Z79899 Other long term (current) drug therapy: Secondary | ICD-10-CM | POA: Diagnosis not present

## 2018-02-18 DIAGNOSIS — R0600 Dyspnea, unspecified: Secondary | ICD-10-CM

## 2018-02-18 DIAGNOSIS — I5031 Acute diastolic (congestive) heart failure: Secondary | ICD-10-CM | POA: Insufficient documentation

## 2018-02-18 DIAGNOSIS — E119 Type 2 diabetes mellitus without complications: Secondary | ICD-10-CM | POA: Diagnosis not present

## 2018-02-18 DIAGNOSIS — I493 Ventricular premature depolarization: Secondary | ICD-10-CM | POA: Diagnosis not present

## 2018-02-18 DIAGNOSIS — R0602 Shortness of breath: Secondary | ICD-10-CM | POA: Diagnosis not present

## 2018-02-18 DIAGNOSIS — R0609 Other forms of dyspnea: Secondary | ICD-10-CM | POA: Insufficient documentation

## 2018-02-18 DIAGNOSIS — N183 Chronic kidney disease, stage 3 (moderate): Secondary | ICD-10-CM | POA: Insufficient documentation

## 2018-02-18 DIAGNOSIS — I130 Hypertensive heart and chronic kidney disease with heart failure and stage 1 through stage 4 chronic kidney disease, or unspecified chronic kidney disease: Secondary | ICD-10-CM | POA: Diagnosis not present

## 2018-02-18 DIAGNOSIS — J45909 Unspecified asthma, uncomplicated: Secondary | ICD-10-CM | POA: Insufficient documentation

## 2018-02-18 DIAGNOSIS — Z7984 Long term (current) use of oral hypoglycemic drugs: Secondary | ICD-10-CM | POA: Diagnosis not present

## 2018-02-18 DIAGNOSIS — Z7982 Long term (current) use of aspirin: Secondary | ICD-10-CM | POA: Insufficient documentation

## 2018-02-18 LAB — CBC
HEMATOCRIT: 36.1 % (ref 36.0–46.0)
HEMOGLOBIN: 11.9 g/dL — AB (ref 12.0–15.0)
MCH: 32.5 pg (ref 26.0–34.0)
MCHC: 33 g/dL (ref 30.0–36.0)
MCV: 98.6 fL (ref 78.0–100.0)
PLATELETS: 179 10*3/uL (ref 150–400)
RBC: 3.66 MIL/uL — AB (ref 3.87–5.11)
RDW: 13.1 % (ref 11.5–15.5)
WBC: 9.7 10*3/uL (ref 4.0–10.5)

## 2018-02-18 LAB — BASIC METABOLIC PANEL
ANION GAP: 11 (ref 5–15)
BUN: 22 mg/dL (ref 8–23)
CHLORIDE: 101 mmol/L (ref 98–111)
CO2: 26 mmol/L (ref 22–32)
Calcium: 10.3 mg/dL (ref 8.9–10.3)
Creatinine, Ser: 1.24 mg/dL — ABNORMAL HIGH (ref 0.44–1.00)
GFR calc non Af Amer: 44 mL/min — ABNORMAL LOW (ref >60)
GFR, EST AFRICAN AMERICAN: 51 mL/min — AB (ref >60)
Glucose, Bld: 225 mg/dL — ABNORMAL HIGH (ref 70–99)
POTASSIUM: 4.1 mmol/L (ref 3.5–5.1)
SODIUM: 138 mmol/L (ref 135–145)

## 2018-02-18 LAB — URINALYSIS, ROUTINE W REFLEX MICROSCOPIC
Bacteria, UA: NONE SEEN
Bilirubin Urine: NEGATIVE
GLUCOSE, UA: NEGATIVE mg/dL
HGB URINE DIPSTICK: NEGATIVE
Ketones, ur: NEGATIVE mg/dL
Leukocytes, UA: NEGATIVE
Nitrite: NEGATIVE
PH: 7 (ref 5.0–8.0)
Protein, ur: 30 mg/dL — AB
SPECIFIC GRAVITY, URINE: 1.009 (ref 1.005–1.030)

## 2018-02-18 LAB — POCT I-STAT TROPONIN I: TROPONIN I, POC: 0 ng/mL (ref 0.00–0.08)

## 2018-02-18 MED ORDER — OXYCODONE-ACETAMINOPHEN 5-325 MG PO TABS
2.0000 | ORAL_TABLET | Freq: Once | ORAL | Status: AC
Start: 2018-02-18 — End: 2018-02-18
  Administered 2018-02-18: 2 via ORAL
  Filled 2018-02-18: qty 2

## 2018-02-18 MED ORDER — FUROSEMIDE 10 MG/ML IJ SOLN
20.0000 mg | Freq: Once | INTRAMUSCULAR | Status: AC
  Administered 2018-02-18: 20 mg via INTRAVENOUS
  Filled 2018-02-18: qty 2

## 2018-02-18 NOTE — ED Provider Notes (Signed)
[facility] EMERGENCY DEPARTMENT Provider Note   CSN: [id number] Arrival date & time: 02/18/18  1449     History   Chief Complaint Chief Complaint  Patient presents with  . Shortness of Breath    HPI Kathleen Whitehead is a 67 y.o. female.  Pt presents to the ED today with SOB.  The pt said it started around noon.  She was lying down when it started.  Nothing makes it better or worse.  She was admitted from 7/21-23 for similar sx.  SOB was though to be multifactorial from metastatic ovarian cancer, anemia, and mild right sided CHF.  The pt was d/c home on lasix 20 mg which she said gave her diarrhea, so she stopped taking it.  She did take 1 dose after sx started and has not improved.  Pt denies any cp, cough, f/c.      Past Medical History:  Diagnosis Date  . Anemia   . Asthma   . Chemotherapy induced neutropenia ([place])   . Chemotherapy induced thrombocytopenia   . Diabetes mellitus without complication ([place])   . Endometrial cancer ([place])   . Hot flashes   . Hypertension   . Malignant neoplasm ([place])   . Malignant neoplasm of ovary ([place]) 04/30/2013  . Obesity   . Ovarian cancer ([place])   . Pancytopenia ([place])   . Peripheral artery disease ([place])   . Peripheral neuropathy   . Pneumonia 2015  . Port-A-Cath in place   . Seizures ([place]) 09/08/2016   ?? due to low magnesium    Patient Active Problem List   Diagnosis Date Noted  . Acute diastolic CHF (congestive heart failure) ([place]) 01/24/2018  . CKD (chronic kidney disease) stage 3, GFR 30-59 ml/min (HCC) 01/24/2018  . Atypical chest pain   . SOB (shortness of breath) 01/22/2018  . Goals of care, counseling/discussion 09/21/2016  . Encounter for orogastric (OG) tube placement   . Encounter for intubation   . Dyspnea   . Status epilepticus ([place]) 09/08/2016  . Acute respiratory failure ([place])   . 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 Axillary lump 12/08/2015  . Blush 03/11/2015  . Acquired pancytopenia ([place])  12/09/2014  . Presence of other vascular implants and grafts 04/18/2014  . Drug-induced low platelet count 12/04/2013  . Peripheral nerve disease 12/04/2013  . Chemotherapy-induced neutropenia ([place]) 08/24/2013  . Malignant neoplasm of endometrium ([place]) 06/11/2013  . Obesity (BMI 30-39.9) 05/07/2013  . Abdominal pain 04/30/2013  . Acute blood loss anemia 04/30/2013  . Type 2 diabetes mellitus ([place]) 04/30/2013  . BP (high blood pressure) 04/30/2013  . 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 ABDOMINAL HYSTERECTOMY  2014     OB History   None      Home Medications    Prior to Admission medications   Medication Sig Start Date End Date Taking? Authorizing Provider  acetaminophen (TYLENOL) 500 MG tablet Take 500 mg by mouth every 6 (six) hours as needed for mild pain or moderate pain.    Yes [provider]  aspirin EC 325 MG tablet Take 325 mg by mouth daily.  09/15/15  Yes [provider]  calcium carbonate (TUMS EX) 750 MG chewable tablet Chew 1 tablet by mouth daily as needed for heartburn.   Yes [provider]  calcium carbonate (TUMS SMOOTHIES) 750 MG chewable tablet Chew 1 tablet by mouth daily as needed for heartburn.   Yes [provider]  cetirizine (ZYRTEC) 10 MG tablet Take 10 mg by mouth daily.    Yes [provider]  Cholecalciferol (VITAMIN D PO) Take 1 tablet by mouth daily.   Yes [provider]  cyanocobalamin 1000 MCG tablet Take 1,000 mcg by mouth daily.   Yes [provider]  furosemide (LASIX) 20 MG tablet Take 1 tablet (20 mg total) by mouth daily. 01/24/18  Yes Tat, Shanon Brow, MD  glipiZIDE (GLUCOTROL) 10 MG tablet TAKE 1 TABLET BY MOUTH TWICE A DAY 01/23/16  Yes [provider]  levETIRAcetam (KEPPRA) 500 MG tablet Take 1 tablet (500 mg total) by mouth 2 (two) times daily. 12/01/17  Yes Higgs, Mathis Dad, MD  lisinopril (PRINIVIL,ZESTRIL) 40  MG tablet Take 40 mg by mouth daily.    Yes [provider]  loperamide (IMODIUM) 2 MG capsule Take 2 mg by mouth as needed for diarrhea or loose stools.    Yes [provider]  magnesium oxide (MAG-OX) 400 MG tablet TAKE 1 TABLET BY MOUTH TWICE A DAY 10/04/17  Yes Holley Bouche, NP  metFORMIN (GLUCOPHAGE) 1000 MG tablet Take 1,000 mg by mouth 2 (two) times daily with a meal.    Yes [provider]  Omega-3 Fatty Acids (FISH OIL) 1000 MG CAPS Take 1,000 mg by mouth daily.    Yes [provider]  ondansetron (ZOFRAN-ODT) 4 MG disintegrating tablet Take 4 mg by mouth every 8 (eight) hours as needed for nausea or vomiting.    Yes [provider]  oxyCODONE-acetaminophen (PERCOCET) 10-325 MG tablet Take 1 tablet by mouth every 4 (four) hours as needed for pain. 02/03/18  Yes Lockamy, Randi L, NP-C  Polyethylene Glycol 3350 (MIRALAX PO) Take 17 g by mouth daily as needed for moderate constipation.    Yes [provider]  prochlorperazine (COMPAZINE) 10 MG tablet TAKE 1 TABLET (10 MG TOTAL) BY MOUTH EVERY 6 (SIX) HOURS AS NEEDED FOR NAUSEA OR VOMITING. 06/28/17  Yes Holley Bouche, NP  simethicone (GAS-X) 80 MG chewable tablet Chew 80 mg by mouth every 6 (six) hours as needed for flatulence.   Yes [provider]  ondansetron (ZOFRAN) 8 MG tablet Take 1 tablet (8 mg total) by mouth every 8 (eight) hours as needed for nausea or vomiting. Patient not taking: Reported on 02/18/2018 07/26/16   Patrici Ranks, MD  Oxycodone HCl 20 MG TABS Take 1 tablet (20 mg total) by mouth every 12 (twelve) hours. For severe pain. Patient not taking: Reported on 02/18/2018 12/22/17   Zoila Shutter, MD    Family History Family History  Problem Relation Age of Onset  . Heart failure Mother   . Stroke Sister   . Cancer Sister   . Diabetes Sister   . Leukemia Sister   . Diabetes Brother     Social History Social History   Tobacco Use  . Smoking  status: Never Smoker  . Smokeless tobacco: Never Used  Substance Use Topics  . Alcohol use: No  . Drug use: No     Allergies   Diphenhydramine; Iodinated diagnostic agents; Duloxetine hcl; Ibuprofen; and Penicillins   Review of Systems Review of Systems  Respiratory: Positive for shortness of breath.   All other systems reviewed and are negative.    Physical Exam Updated Vital Signs BP (!) 168/134   Pulse 93   Temp 97.9 F (36.6 C) (Oral)   Resp 15   Ht 5\' 9"  (1.753 m)   Wt 108.4 kg  SpO2 97%   BMI 35.29 kg/m   Physical Exam  Constitutional: She is oriented to person, place, and time. She appears well-developed and well-nourished.  HENT:  Head: Normocephalic and atraumatic.  Mouth/Throat: Oropharynx is clear and moist.  Eyes: Pupils are equal, round, and reactive to light. EOM are normal.  Neck: Normal range of motion. Neck supple.  Cardiovascular: Normal rate and regular rhythm.  Pulmonary/Chest: Effort normal and breath sounds normal.  Abdominal: Soft. Bowel sounds are normal.  Musculoskeletal: Normal range of motion.       Right lower leg: Normal.       Left lower leg: Normal.  Neurological: She is alert and oriented to person, place, and time.  Skin: Skin is warm. Capillary refill takes less than 2 seconds.  Psychiatric: She has a normal mood and affect. Her behavior is normal.  Nursing note and vitals reviewed.    ED Treatments / Results  Labs (all labs ordered are listed, but only abnormal results are displayed) Labs Reviewed  BASIC METABOLIC PANEL - Abnormal; Notable for the following components:      Result Value   Glucose, Bld 225 (*)    Creatinine, Ser 1.24 (*)    GFR calc non Af Amer 44 (*)    GFR calc Af Amer 51 (*)    All other components within normal limits  CBC - Abnormal; Notable for the following components:   RBC 3.66 (*)    Hemoglobin 11.9 (*)    All other components within normal limits  URINALYSIS, ROUTINE W REFLEX MICROSCOPIC  - Abnormal; Notable for the following components:   Color, Urine STRAW (*)    Protein, ur 30 (*)    All other components within normal limits  I-STAT TROPONIN, ED  POCT I-STAT TROPONIN I    EKG EKG Interpretation  Date/Time:  Saturday February 18 2018 14:58:10 EDT Ventricular Rate:  98 PR Interval:  148 QRS Duration: 88 QT Interval:  320 QTC Calculation: 408 R Axis:   -16 Text Interpretation:  Sinus rhythm with occasional Premature ventricular complexes Otherwise normal ECG No significant change since last tracing Confirmed by Isla Pence [phone]) on 02/18/2018 3:20:00 PM   Radiology Dg Chest 2 View  Result Date: 02/18/2018 CLINICAL DATA:  Shortness of breath EXAM: CHEST - 2 VIEW COMPARISON:  Chest radiograph and chest CT January 22, 2018 FINDINGS: There is no edema or consolidation. Nodular opacities seen in the lung parenchyma on recent chest CT are not appreciable by radiography. Heart size and pulmonary vascularity are normal. No adenopathy. Port-A-Cath tip is in the right atrium just beyond the cavoatrial junction. No bone lesions. IMPRESSION: No edema or consolidation. Note that pulmonary nodular lesions seen on chest CT are not appreciable by radiography. Stable cardiac silhouette. Stable Port-A-Cath positioning. Electronically Signed   By: Lowella Grip III M.D.   On: 02/18/2018 16:47    Procedures Procedures (including critical care time)  Medications Ordered in ED Medications  furosemide (LASIX) injection 20 mg (20 mg Intravenous Given 02/18/18 1559)  oxyCODONE-acetaminophen (PERCOCET/ROXICET) 5-325 MG per tablet 2 tablet (2 tablets Oral Given 02/18/18 1709)     Initial Impression / Assessment and Plan / ED Course  I have reviewed the triage vital signs and the nursing notes.  Pertinent labs & imaging results that were available during my care of the patient were reviewed by me and considered in my medical decision making (see chart for details).    Pt is feeling  better after her normal  percocet and IV lasix.  She was able to ambulate without her pulse ox dropping.  I have a low suspicion for PE as previous work up for the same did not show pe and she is not hypoxic.  Pt instructed to return if worse and to resume the lasix.  F/u with pcp.  Final Clinical Impressions(s) / ED Diagnoses   Final diagnoses:  Dyspnea on exertion    ED Discharge Orders    None       Isla Pence, MD 02/18/18 [phone]

## 2018-02-18 NOTE — ED Triage Notes (Addendum)
Patient c/o shortness of breath that started today at 12pm. Per patient laying down when it started. Denies any chest pain. Patient reports being seen here in ER last month for same reason and being admitted x3 days with multiple tests. Denies any diagnosis but was given lasix for edema. Patient denies diagnosis of CHF. Per patient stopped taking Lasix due to diarrhea but reports taking 20mg  today. Denies any swelling. Patient has ovarian cancer that has metastasized into lungs.

## 2018-02-18 NOTE — ED Notes (Signed)
O2 remained about 96% while ambulation, steady gait, poor posture (bending over slightly), pulse remained in 110 range.

## 2018-02-27 DIAGNOSIS — S46812A Strain of other muscles, fascia and tendons at shoulder and upper arm level, left arm, initial encounter: Secondary | ICD-10-CM | POA: Diagnosis not present

## 2018-02-27 DIAGNOSIS — E1143 Type 2 diabetes mellitus with diabetic autonomic (poly)neuropathy: Secondary | ICD-10-CM | POA: Diagnosis not present

## 2018-02-27 DIAGNOSIS — I10 Essential (primary) hypertension: Secondary | ICD-10-CM | POA: Diagnosis not present

## 2018-02-27 DIAGNOSIS — G40311 Generalized idiopathic epilepsy and epileptic syndromes, intractable, with status epilepticus: Secondary | ICD-10-CM | POA: Diagnosis not present

## 2018-02-27 DIAGNOSIS — E1122 Type 2 diabetes mellitus with diabetic chronic kidney disease: Secondary | ICD-10-CM | POA: Diagnosis not present

## 2018-02-27 DIAGNOSIS — C569 Malignant neoplasm of unspecified ovary: Secondary | ICD-10-CM | POA: Diagnosis not present

## 2018-02-27 DIAGNOSIS — D61811 Other drug-induced pancytopenia: Secondary | ICD-10-CM | POA: Diagnosis not present

## 2018-02-28 ENCOUNTER — Other Ambulatory Visit: Payer: Self-pay

## 2018-02-28 ENCOUNTER — Inpatient Hospital Stay (HOSPITAL_COMMUNITY): Payer: Medicare HMO | Attending: Hematology | Admitting: Hematology

## 2018-02-28 ENCOUNTER — Encounter (HOSPITAL_COMMUNITY): Payer: Self-pay | Admitting: Hematology

## 2018-02-28 ENCOUNTER — Inpatient Hospital Stay (HOSPITAL_COMMUNITY): Payer: Medicare HMO

## 2018-02-28 VITALS — BP 133/53 | HR 90 | Temp 98.6°F | Resp 18 | Wt 220.5 lb

## 2018-02-28 DIAGNOSIS — E119 Type 2 diabetes mellitus without complications: Secondary | ICD-10-CM | POA: Insufficient documentation

## 2018-02-28 DIAGNOSIS — Z79811 Long term (current) use of aromatase inhibitors: Secondary | ICD-10-CM | POA: Diagnosis not present

## 2018-02-28 DIAGNOSIS — E6609 Other obesity due to excess calories: Secondary | ICD-10-CM

## 2018-02-28 DIAGNOSIS — C569 Malignant neoplasm of unspecified ovary: Secondary | ICD-10-CM

## 2018-02-28 DIAGNOSIS — Z923 Personal history of irradiation: Secondary | ICD-10-CM | POA: Diagnosis not present

## 2018-02-28 DIAGNOSIS — G620 Drug-induced polyneuropathy: Secondary | ICD-10-CM | POA: Diagnosis not present

## 2018-02-28 DIAGNOSIS — I10 Essential (primary) hypertension: Secondary | ICD-10-CM | POA: Diagnosis not present

## 2018-02-28 DIAGNOSIS — Z9221 Personal history of antineoplastic chemotherapy: Secondary | ICD-10-CM | POA: Diagnosis not present

## 2018-02-28 DIAGNOSIS — R5383 Other fatigue: Secondary | ICD-10-CM | POA: Insufficient documentation

## 2018-02-28 LAB — COMPREHENSIVE METABOLIC PANEL
ALBUMIN: 3.1 g/dL — AB (ref 3.5–5.0)
ALK PHOS: 58 U/L (ref 38–126)
ALT: 13 U/L (ref 0–44)
ANION GAP: 9 (ref 5–15)
AST: 15 U/L (ref 15–41)
BILIRUBIN TOTAL: 0.4 mg/dL (ref 0.3–1.2)
BUN: 23 mg/dL (ref 8–23)
CALCIUM: 9.1 mg/dL (ref 8.9–10.3)
CO2: 27 mmol/L (ref 22–32)
CREATININE: 1.24 mg/dL — AB (ref 0.44–1.00)
Chloride: 102 mmol/L (ref 98–111)
GFR calc Af Amer: 51 mL/min — ABNORMAL LOW (ref >60)
GFR calc non Af Amer: 44 mL/min — ABNORMAL LOW (ref >60)
GLUCOSE: 201 mg/dL — AB (ref 70–99)
Potassium: 4.3 mmol/L (ref 3.5–5.1)
SODIUM: 138 mmol/L (ref 135–145)
TOTAL PROTEIN: 7.2 g/dL (ref 6.5–8.1)

## 2018-02-28 LAB — CBC WITH DIFFERENTIAL/PLATELET
BASOS PCT: 0 %
Basophils Absolute: 0 10*3/uL (ref 0.0–0.1)
Eosinophils Absolute: 0.1 10*3/uL (ref 0.0–0.7)
Eosinophils Relative: 2 %
HEMATOCRIT: 34.1 % — AB (ref 36.0–46.0)
HEMOGLOBIN: 11.1 g/dL — AB (ref 12.0–15.0)
LYMPHS ABS: 1.8 10*3/uL (ref 0.7–4.0)
Lymphocytes Relative: 27 %
MCH: 32.6 pg (ref 26.0–34.0)
MCHC: 32.6 g/dL (ref 30.0–36.0)
MCV: 100.3 fL — ABNORMAL HIGH (ref 78.0–100.0)
MONOS PCT: 7 %
Monocytes Absolute: 0.5 10*3/uL (ref 0.1–1.0)
NEUTROS ABS: 4.3 10*3/uL (ref 1.7–7.7)
NEUTROS PCT: 64 %
Platelets: 162 10*3/uL (ref 150–400)
RBC: 3.4 MIL/uL — ABNORMAL LOW (ref 3.87–5.11)
RDW: 13.5 % (ref 11.5–15.5)
WBC: 6.6 10*3/uL (ref 4.0–10.5)

## 2018-02-28 MED ORDER — ANASTROZOLE 1 MG PO TABS: 1.0000 mg | ORAL_TABLET | Freq: Every day | ORAL | 3 refills | Status: DC

## 2018-02-28 NOTE — Progress Notes (Signed)
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 [name] and associates, pathology could not be certain that these were not 2 separate primaries rather than one metastatic process from the uterus to the ovary.    08/15/2013 - 12/06/2013 Chemotherapy    Carboplatin/Taxol with dose reduction of 50% and Taxol due to grade 2+ peripheral neuropathy and gabapentin induced nightmare/hallucinations. 5 out of 6 cycles given with the final cycle being cancelled secondary to significant thrombocytopenia/intolerance.    09/10/2013 - 10/17/2013 Radiation Therapy    5040 cGy delivered by EBRT followed by intravaginal brachytherapy.    04/30/2014 PET scan    PET scan consistent with recurrent disease of either ovarian cancer or endometrial cancer.    06/03/2014 - 07/15/2014 Radiation Therapy    5040 cGy over 28 fractions delivered to the low periaortic area lymph node either metastatic endometrial or metastatic or ovarian cancer.    07/19/2014 Imaging    Imaging concerning for new mesenteric disease and subdiaphragmatic disease.    08/05/2014 PET scan    No evidence of progression of disease.    08/05/2014 Remission    Negative PET scan.    06/11/2016 PET scan    1. Hypermetabolic aortocaval and right common iliac adenopathy, maximum SUV 17.1, compatible with malignancy. 2. Calcified left mesenteric mass has only low-grade activity, maximum SUV 5.3, merit surveillance. 3. There several small calcifications along the omentum which are not currently metabolically active. 4. Along the scarring in operative site  from prior laparotomy there some faintly accentuated metabolic activity which is probably related to the original wound and unlikely to be from tumor. 5. Coronary, aortic arch, and branch vessel atherosclerotic vascular disease.    06/11/2016 Relapse/Recurrence    PET scan with aortocaval & right iliac adenopathy consistent with malignancy.     08/03/2016 -  Chemotherapy    Carboplatin Day 1/Gemzar Days 1&8 every 21 days     08/10/2016 Treatment Plan Change    Day 8 cycle 1 cancelled due to cytopenia    09/07/2016 Treatment Plan Change    Treatment delayed x 1 week due to thrombocytopenia.  Hypomagnesemia noted and replaced IV.    09/08/2016 - 09/12/2016 Hospital Admission    Admit date: 09/08/2016  Admission diagnosis: Seizure Additional comments: Intubated on 3/7 for airway protection and extubated on 09/09/2016.  Hospital course complicated by pancytopenia secondary to systemic chemotherapy.    09/08/2016 Imaging    EEG- This sedated EEG is abnormal due to diffuse slowing of the waking background.  Clinical Correlation of the above findings indicates diffuse cerebral dysfunction that is non-specific in etiology and can be seen with hypoxic/ischemic injury, toxic/metabolic encephalopathies, neurodegenerative disorders, or medication effect.  The absence of epileptiform discharges does not rule out a clinical diagnosis of epilepsy.  Clinical correlation is advised.    09/12/2016 Imaging    CT head- No metastatic disease or acute intracranial abnormality. Normal for age CT appearance of the brain.    09/21/2016 Treatment Plan Change    Carboplatin deleted from treatment plan.  Will pursue single-agent Gemcitabine at reduced dose.    11/19/2016  Relapse/Recurrence    Diagnosis Lymph node, needle/core biopsy, left supraclavicular METASTATIC ADENOCARCINOMA, CONSISTENT WITH GYNECOLOGICAL PRIMARY.     INTERVAL HISTORY:  [name] 67 y.o. female returns for routine follow-up Metastatic ovarian  cancer. Patient is here today with her husband. She reports fatigue everyday. She is not very active at home. She is also having bilateral lower extremity edema. Her numbness in her hands and feet are stable at this time. She reports her appetite and energy level at 50%. She is drinking boost daily to help maintain her weight.Patient denies any new pains. Denies any vomiting or diarrhea.    REVIEW OF SYSTEMS:  Review of Systems  Constitutional: Positive for fatigue.  Cardiovascular: Positive for leg swelling.  Gastrointestinal: Positive for nausea.  Neurological: Positive for numbness.  All other systems reviewed and are negative.    PAST MEDICAL/SURGICAL HISTORY:  Past Medical History:  Diagnosis Date  . Anemia   . Asthma   . Chemotherapy induced neutropenia ([place])   . Chemotherapy induced thrombocytopenia   . Diabetes mellitus without complication ([place])   . Endometrial cancer ([place])   . Hot flashes   . Hypertension   . Malignant neoplasm ([place])   . Malignant neoplasm of ovary ([place]) 04/30/2013  . Obesity   . Ovarian cancer ([place])   . Pancytopenia ([place])   . Peripheral artery disease ([place])   . Peripheral neuropathy   . Pneumonia 2015  . Port-A-Cath in place   . Seizures ([place]) 09/08/2016   ?? due to low magnesium   Past Surgical History:  Procedure Laterality Date  . ABDOMINAL HYSTERECTOMY  2014     SOCIAL HISTORY:  Social History   Socioeconomic History  . Marital status: Married    Spouse name: Not on file  . Number of children: Not on file  . Years of education: Not on file  . Highest education level: Not on file  Occupational History  . Not on file  Social Needs  . Financial resource strain: Not on file  . Food insecurity:    Worry: Not on file    Inability: Not on file  . Transportation needs:    Medical: Not on file    Non-medical: Not on file  Tobacco Use  . Smoking status: Never Smoker  . Smokeless tobacco: Never Used  Substance and Sexual Activity    . Alcohol use: No  . Drug use: No  . Sexual activity: Not Currently  Lifestyle  . Physical activity:    Days per week: Not on file    Minutes per session: Not on file  . Stress: Not on file  Relationships  . Social connections:    Talks on phone: Not on file    Gets together: Not on file    Attends religious service: Not on file    Active member of club or organization: Not on file    Attends meetings of clubs or organizations: Not on file    Relationship status: Not on file  . Intimate partner violence:    Fear of current or ex partner: Not on file    Emotionally abused: Not on file    Physically abused: Not on file    Forced sexual activity: Not on file  Other Topics Concern  . Not on file  Social History Narrative  . Not on file    FAMILY HISTORY:  Family History  Problem Relation Age of Onset  . Heart failure Mother   . Stroke Sister   .  Cancer Sister   . Diabetes Sister   . Leukemia Sister   . Diabetes Brother     CURRENT MEDICATIONS:  Outpatient Encounter Medications as of 02/28/2018  Medication Sig  . acetaminophen (TYLENOL) 500 MG tablet Take 500 mg by mouth every 6 (six) hours as needed for mild pain or moderate pain.   Marland Kitchen aspirin EC 325 MG tablet Take 325 mg by mouth daily.   . calcium carbonate (TUMS EX) 750 MG chewable tablet Chew 1 tablet by mouth daily as needed for heartburn.  . calcium carbonate (TUMS SMOOTHIES) 750 MG chewable tablet Chew 1 tablet by mouth daily as needed for heartburn.  . cetirizine (ZYRTEC) 10 MG tablet Take 10 mg by mouth daily.   . Cholecalciferol (VITAMIN D PO) Take 1 tablet by mouth daily.  . cyanocobalamin 1000 MCG tablet Take 1,000 mcg by mouth daily.  . cyclobenzaprine (FLEXERIL) 10 MG tablet Take 10 mg by mouth 3 (three) times daily as needed for muscle spasms.  . furosemide (LASIX) 20 MG tablet Take 1 tablet (20 mg total) by mouth daily.  Marland Kitchen glipiZIDE (GLUCOTROL) 10 MG tablet TAKE 1 TABLET BY MOUTH TWICE A DAY  .  levETIRAcetam (KEPPRA) 500 MG tablet Take 1 tablet (500 mg total) by mouth 2 (two) times daily.  Marland Kitchen lisinopril (PRINIVIL,ZESTRIL) 40 MG tablet Take 40 mg by mouth daily.   Marland Kitchen loperamide (IMODIUM) 2 MG capsule Take 2 mg by mouth as needed for diarrhea or loose stools.   . magnesium oxide (MAG-OX) 400 MG tablet TAKE 1 TABLET BY MOUTH TWICE A DAY  . metFORMIN (GLUCOPHAGE) 1000 MG tablet Take 1,000 mg by mouth 2 (two) times daily with a meal.   . Omega-3 Fatty Acids (FISH OIL) 1000 MG CAPS Take 1,000 mg by mouth daily.   . ondansetron (ZOFRAN) 8 MG tablet Take 1 tablet (8 mg total) by mouth every 8 (eight) hours as needed for nausea or vomiting.  . ondansetron (ZOFRAN-ODT) 4 MG disintegrating tablet Take 4 mg by mouth every 8 (eight) hours as needed for nausea or vomiting.   Marland Kitchen oxyCODONE-acetaminophen (PERCOCET) 10-325 MG tablet Take 1 tablet by mouth every 4 (four) hours as needed for pain.  . Polyethylene Glycol 3350 (MIRALAX PO) Take 17 g by mouth daily as needed for moderate constipation.   . prochlorperazine (COMPAZINE) 10 MG tablet TAKE 1 TABLET (10 MG TOTAL) BY MOUTH EVERY 6 (SIX) HOURS AS NEEDED FOR NAUSEA OR VOMITING.  . simethicone (GAS-X) 80 MG chewable tablet Chew 80 mg by mouth every 6 (six) hours as needed for flatulence.  . [DISCONTINUED] Oxycodone HCl 20 MG TABS Take 1 tablet (20 mg total) by mouth every 12 (twelve) hours. For severe pain.  Marland Kitchen anastrozole (ARIMIDEX) 1 MG tablet Take 1 tablet (1 mg total) by mouth daily.   No facility-administered encounter medications on file as of 02/28/2018.     ALLERGIES:  Allergies  Allergen Reactions  . Diphenhydramine Palpitations  . Iodinated Diagnostic Agents Other (See Comments)    Unknown  . Duloxetine Hcl Nausea And Vomiting  . Ibuprofen Other (See Comments)    Makes my bones hurt  . Penicillins Rash    Has patient had a PCN reaction causing immediate rash, facial/tongue/throat swelling, SOB or lightheadedness with hypotension: No Has  patient had a PCN reaction causing severe rash involving mucus membranes or skin necrosis: No Has patient had a PCN reaction that required hospitalization: No Has patient had a PCN reaction occurring within the last  10 years: No If all of the above answers are "NO", then may proceed with Cephalosporin use.      PHYSICAL EXAM:  ECOG Performance status: 1  Vitals:   02/28/18 1120  BP: (!) 133/53  Pulse: 90  Resp: 18  Temp: 98.6 F (37 C)  SpO2: 97%   Filed Weights   02/28/18 1120  Weight: 220 lb 8 oz (100 kg)    Physical Exam  Constitutional: She is oriented to person, place, and time. She appears well-developed and well-nourished.  Cardiovascular: Normal rate, regular rhythm and normal heart sounds.  Pulmonary/Chest: Effort normal and breath sounds normal.  Neurological: She is alert and oriented to person, place, and time.  Skin: Skin is warm and dry.  Psychiatric: She has a normal mood and affect. Her behavior is normal. Thought content normal.     LABORATORY DATA:  I have reviewed the labs as listed.  CBC    Component Value Date/Time   WBC 6.6 02/28/2018 0943   RBC 3.40 (L) 02/28/2018 0943   HGB 11.1 (L) 02/28/2018 0943   HCT 34.1 (L) 02/28/2018 0943   PLT 162 02/28/2018 0943   MCV 100.3 (H) 02/28/2018 0943   MCH 32.6 02/28/2018 0943   MCHC 32.6 02/28/2018 0943   RDW 13.5 02/28/2018 0943   LYMPHSABS 1.8 02/28/2018 0943   MONOABS 0.5 02/28/2018 0943   EOSABS 0.1 02/28/2018 0943   BASOSABS 0.0 02/28/2018 0943   CMP Latest Ref Rng & Units 02/28/2018 02/18/2018 01/24/2018  Glucose 70 - 99 mg/dL 201(H) 225(H) 152(H)  BUN 8 - 23 mg/dL 23 22 28(H)  Creatinine 0.44 - 1.00 mg/dL 1.24(H) 1.24(H) 1.35(H)  Sodium 135 - 145 mmol/L 138 138 141  Potassium 3.5 - 5.1 mmol/L 4.3 4.1 3.7  Chloride 98 - 111 mmol/L 102 101 105  CO2 22 - 32 mmol/L '27 26 30  '$ Calcium 8.9 - 10.3 mg/dL 9.1 10.3 9.2  Total Protein 6.5 - 8.1 g/dL 7.2 - -  Total Bilirubin 0.3 - 1.2 mg/dL 0.4 - -    Alkaline Phos 38 - 126 U/L 58 - -  AST 15 - 41 U/L 15 - -  ALT 0 - 44 U/L 13 - -       DIAGNOSTIC IMAGING:  I have personally reviewed images of the CT scan of the chest and discussed with the patient.     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  Recurrent ovarian cancer: - Status post TAH and BSO in 2014 followed by carboplatin and Taxol for 5 cycles, last cycle DC'd due to thrombocytopenia, followed by EBRT and brachii therapy. -PET imaging and December 2017 demonstrated progression, treated with systemic chemotherapy with carboplatin/gemcitabine from 08/03/2016 through April 2018, poorly tolerated secondary to cytopenias. - Foundation 1 results show MS stable, TMB intermediate, no other actionable mutations, PDL 1 of 0% - In May 2018, left supraclavicular lymph node biopsy consistent with metastatic adenocarcinoma, GYN primary, ER positive - Discussed the results of the PET CT scan dated 01/10/2018 which shows progression of disease, with a hypermetabolic left supraclavicular lymph node, 3 new lung nodules in both lungs, progression of retroperitoneal adenopathy. - Patient would like to have therapy for her cancer as long as she can tolerate it well.  Because of her severe neuropathy she would not be a candidate for most chemotherapeutic agents in this setting.  We can use single agent carboplatin as she is platinum sensitive.  Since her tumor is slowly growing, I  have recommended antiestrogen therapy with anastrozole.  -We will consider germline BRCA1/2 testing.  This will give [place] the option of targeted therapy in the form of PARP1 inhibitors. - She had few visits to the ER with difficulty breathing.  A CT scan of the chest did not reveal any pulmonary embolism.  Lung nodules were stable.  I talked to her again about starting her on anastrozole.  We discussed the side effects in detail.  I have sent the prescription to her pharmacy.  I will see her back in 2 months for  follow-up.  Ca-125 level today is pending.  I will schedule her another set of scans when I see her back in 2 months.  2.  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 -She takes Percocet every 4-6 hours for her neuropathic pain.      Orders placed this encounter:  Orders Placed This Encounter  Procedures  . CBC with Differential/Platelet  . Comprehensive metabolic panel  . Lactate dehydrogenase  . CA Sawyer, MD [place] [phone]

## 2018-02-28 NOTE — Patient Instructions (Signed)
[place] at [facility] Discharge Instructions  Follow up in 2 months with labs the same day.    Thank you for choosing [place] at [facility] to provide your oncology and hematology care.  To afford each patient quality time with our provider, please arrive at least 15 minutes before your scheduled appointment time.   If you have a lab appointment with the [facility] please come in thru the  Main Entrance and check in at the main information desk  You need to re-schedule your appointment should you arrive 10 or more minutes late.  We strive to give you quality time with our providers, and arriving late affects you and other patients whose appointments are after yours.  Also, if you no show three or more times for appointments you may be dismissed from the clinic at the providers discretion.     Again, thank you for choosing [facility]  Our hope is that these requests will decrease the amount of time that you wait before being seen by our physicians.       _____________________________________________________________  Should you have questions after your visit to [facility], please contact our office at [phone] between the hours of 8:00 a.m. and 4:30 p.m.  Voicemails left after 4:00 p.m. will not be returned until the following business day.  For prescription refill requests, have your pharmacy contact our office and allow 72 hours.    Cancer Center Support Programs:   > Cancer Support Group  2nd Tuesday of the month 1pm-2pm, Journey Room

## 2018-02-28 NOTE — Assessment & Plan Note (Signed)
1.  Recurrent ovarian cancer: - Status post TAH and BSO in 2014 followed by carboplatin and Taxol for 5 cycles, last cycle DC'd due to thrombocytopenia, followed by EBRT and brachii therapy. -PET imaging and December 2017 demonstrated progression, treated with systemic chemotherapy with carboplatin/gemcitabine from 08/03/2016 through April 2018, poorly tolerated secondary to cytopenias. - Foundation 1 results show MS stable, TMB intermediate, no other actionable mutations, PDL 1 of 0% - In May 2018, left supraclavicular lymph node biopsy consistent with metastatic adenocarcinoma, GYN primary, ER positive - Discussed the results of the PET CT scan dated 01/10/2018 which shows progression of disease, with a hypermetabolic left supraclavicular lymph node, 3 new lung nodules in both lungs, progression of retroperitoneal adenopathy. - Patient would like to have therapy for her cancer as long as she can tolerate it well.  Because of her severe neuropathy she would not be a candidate for most chemotherapeutic agents in this setting.  We can use single agent carboplatin as she is platinum sensitive.  Since her tumor is slowly growing, I have recommended antiestrogen therapy with anastrozole.  -We will consider germline BRCA1/2 testing.  This will give [place] the option of targeted therapy in the form of PARP1 inhibitors. - She had few visits to the ER with difficulty breathing.  A CT scan of the chest did not reveal any pulmonary embolism.  Lung nodules were stable.  I talked to her again about starting her on anastrozole.  We discussed the side effects in detail.  I have sent the prescription to her pharmacy.  I will see her back in 2 months for follow-up.  Ca-125 level today is pending.  I will schedule her another set of scans when I see her back in 2 months.  2.  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 -She takes Percocet every 4-6 hours for her neuropathic  pain.

## 2018-03-01 LAB — CA 125: CANCER ANTIGEN (CA) 125: 332 U/mL — AB (ref 0.0–38.1)

## 2018-03-02 ENCOUNTER — Other Ambulatory Visit (HOSPITAL_COMMUNITY): Payer: Self-pay | Admitting: *Deleted

## 2018-03-02 DIAGNOSIS — C569 Malignant neoplasm of unspecified ovary: Secondary | ICD-10-CM

## 2018-03-02 MED ORDER — OXYCODONE-ACETAMINOPHEN 10-325 MG PO TABS: 1.0000 | ORAL_TABLET | ORAL | 0 refills | Status: DC | PRN

## 2018-03-13 DIAGNOSIS — S46812A Strain of other muscles, fascia and tendons at shoulder and upper arm level, left arm, initial encounter: Secondary | ICD-10-CM | POA: Diagnosis not present

## 2018-03-13 DIAGNOSIS — J019 Acute sinusitis, unspecified: Secondary | ICD-10-CM | POA: Diagnosis not present

## 2018-03-30 ENCOUNTER — Other Ambulatory Visit (HOSPITAL_COMMUNITY): Payer: Self-pay | Admitting: *Deleted

## 2018-03-30 DIAGNOSIS — C569 Malignant neoplasm of unspecified ovary: Secondary | ICD-10-CM

## 2018-03-30 MED ORDER — OXYCODONE-ACETAMINOPHEN 10-325 MG PO TABS: 1.0000 | ORAL_TABLET | ORAL | 0 refills | Status: DC | PRN

## 2018-03-30 NOTE — Telephone Encounter (Signed)
Patient called today asking for refills on pain medication. Refills request sent to provider.

## 2018-04-22 IMAGING — DX DG CHEST 1V PORT
1 series · 1 of 1 positions shown · non-contrast
Comparison: 09/09/2016

CLINICAL DATA: Hypertension, seizure, and ovarian cancer.

EXAM:
PORTABLE CHEST 1 VIEW

[chest ap]
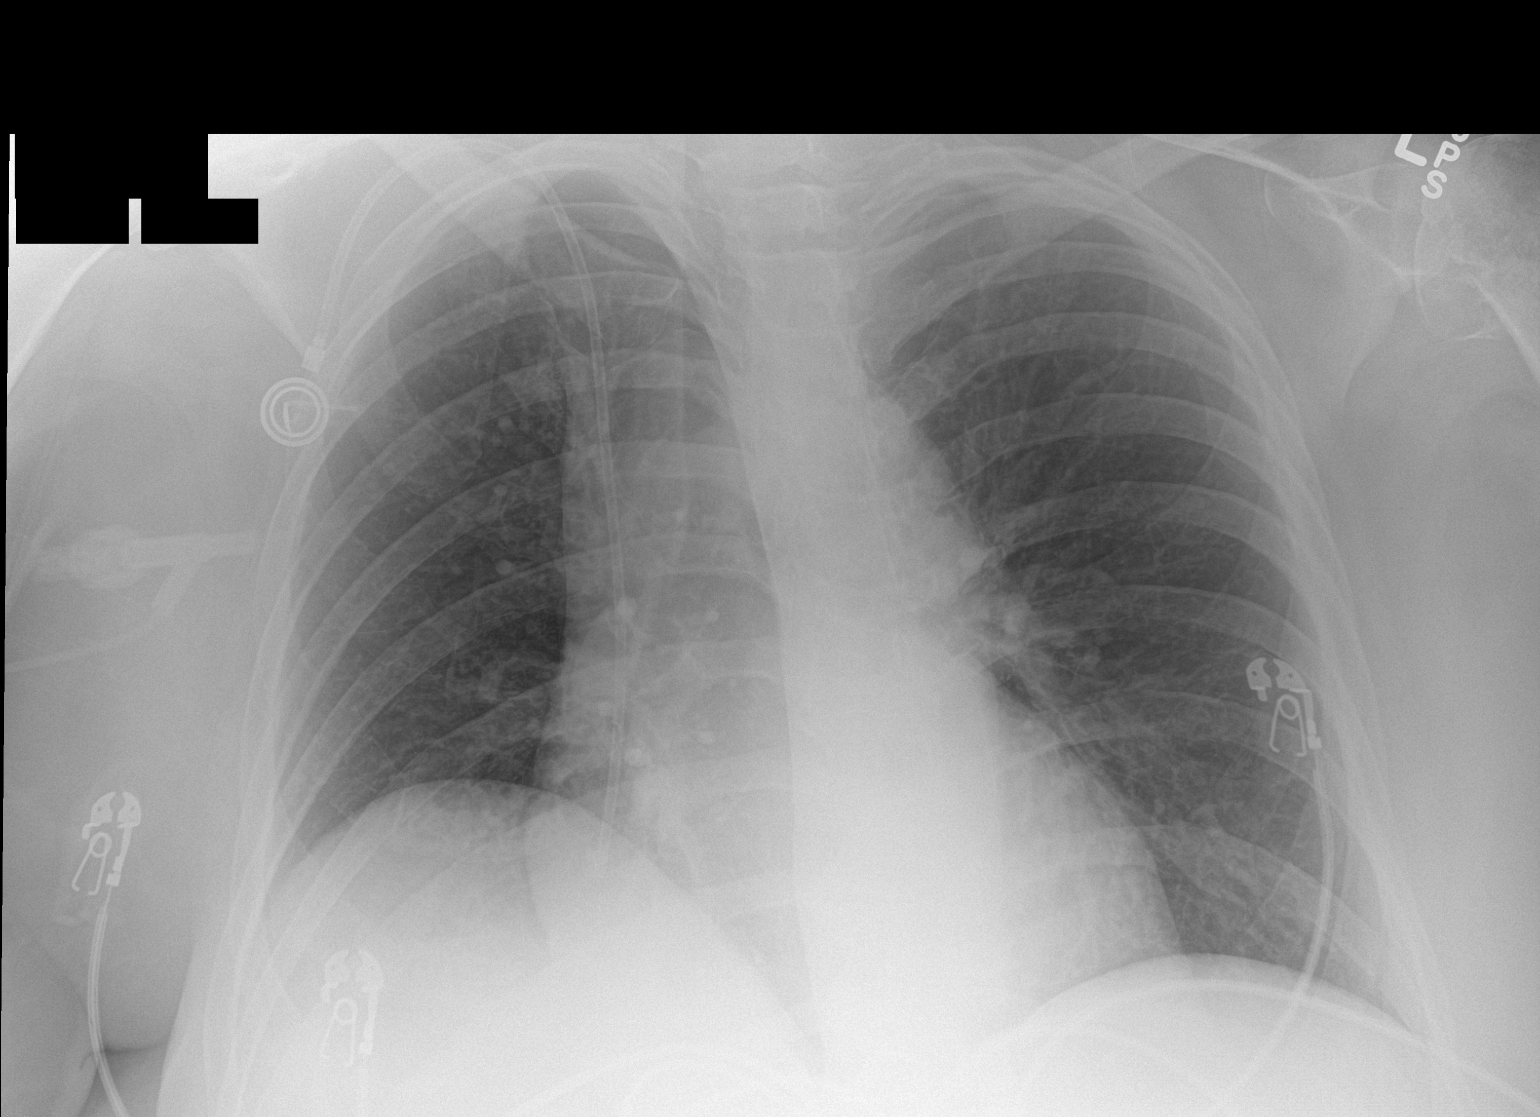

[1 of 1 positions shown; findings below may reference images not displayed]

FINDINGS: Right-sided PowerPort tip overlies the level of the right atrium.
The heart is enlarged. Lungs are clear. Endotracheal tube and
nasogastric tube have been removed.
IMPRESSION: No evidence for acute cardiopulmonary abnormality.

## 2018-04-24 IMAGING — CT CT HEAD WO/W CM
3 of 4 series · 15 of 47 positions shown, 18 images · IV contrast (Omni 300)
Comparison: [facility] [HOSPITAL] noncontrast head CT
09/07/2016.

CLINICAL DATA: 66-year-old female with recurrent ovarian cancer on
chemotherapy. Fall with seizure like activity on 09/07/2016.
Transferred from [facility] [HOSPITAL].

EXAM:
CT HEAD WITHOUT AND WITH CONTRAST
TECHNIQUE: Contiguous axial images were obtained from the base of the skull
through the vertex without and with intravenous contrast
CONTRAST:  75mL PAWG3B-ROO IOPAMIDOL (PAWG3B-ROO) INJECTION 61%

[Series 3: head without without · axial · non-contrast · 0.47mm/px · z∈[-139,-4]mm · 9 of 33 slices shown, 12 images]
[im 3/33  brain]
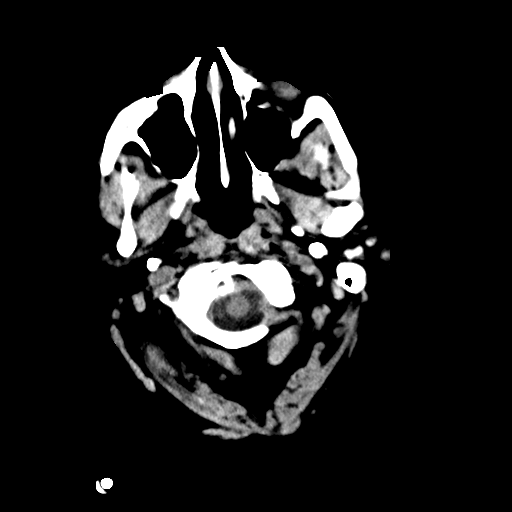
[im 3/33  bone]
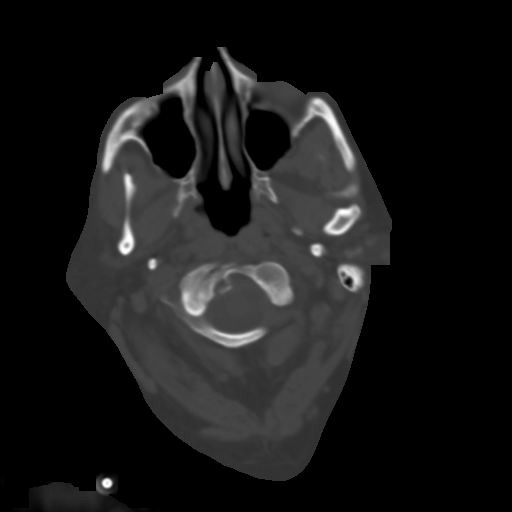
[im 7/33  brain]
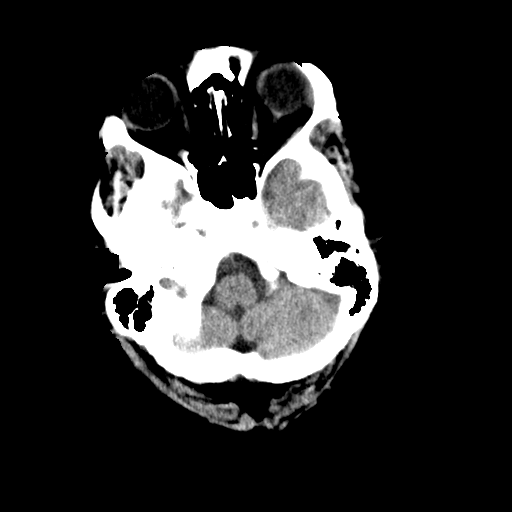
[im 10/33  brain]
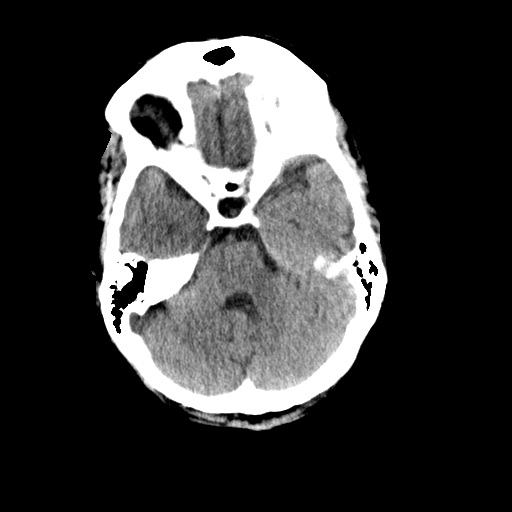
[im 14/33  brain]
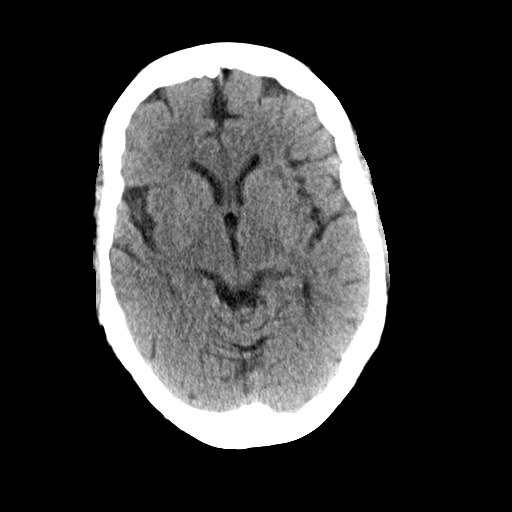
[im 17/33  brain]
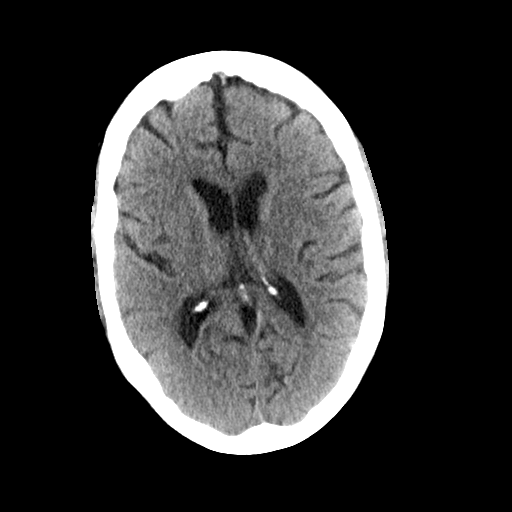
[im 17/33  bone]
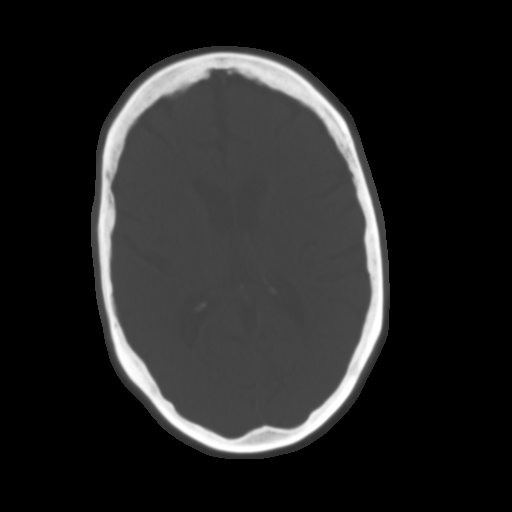
[im 19/33  brain]
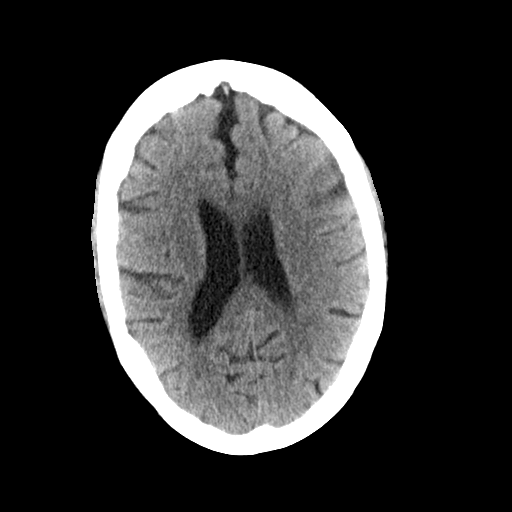
[im 23/33  brain]
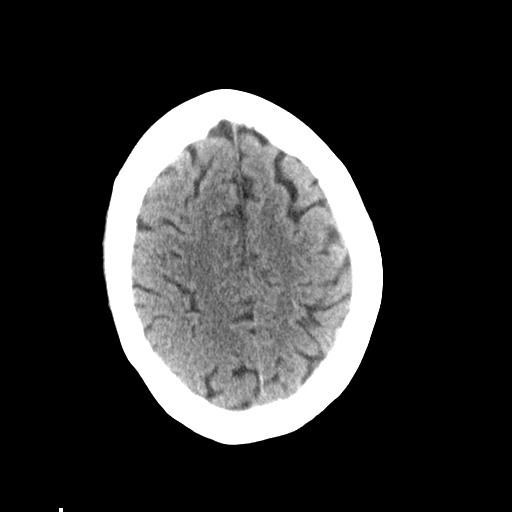
[im 26/33  brain]
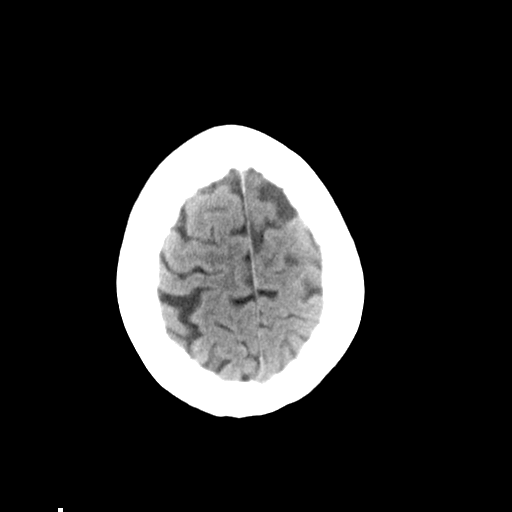
[im 30/33  brain]
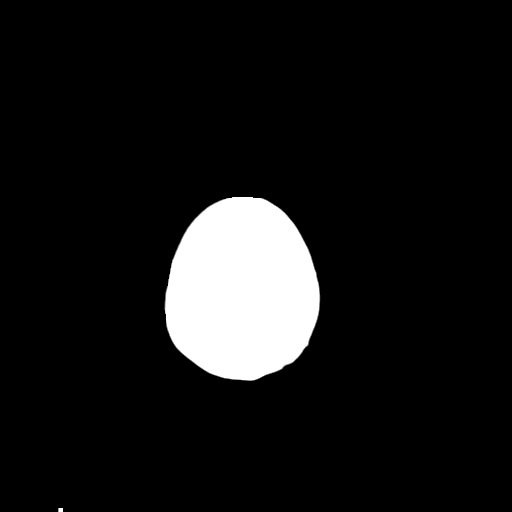
[im 30/33  bone]
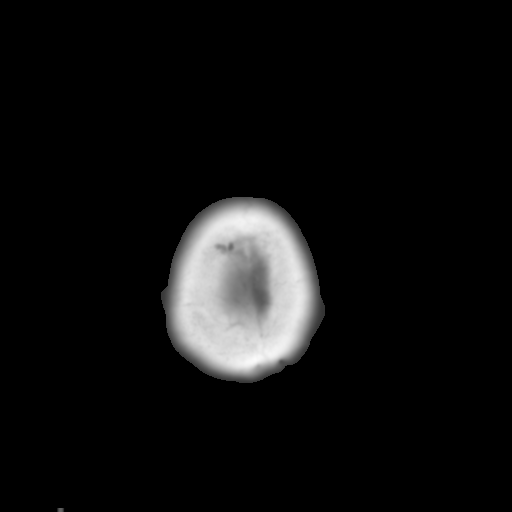

[Series 6: head with cor · coronal · 0.32mm/px · 3 of 67 slices shown]
[im 23/67  brain]
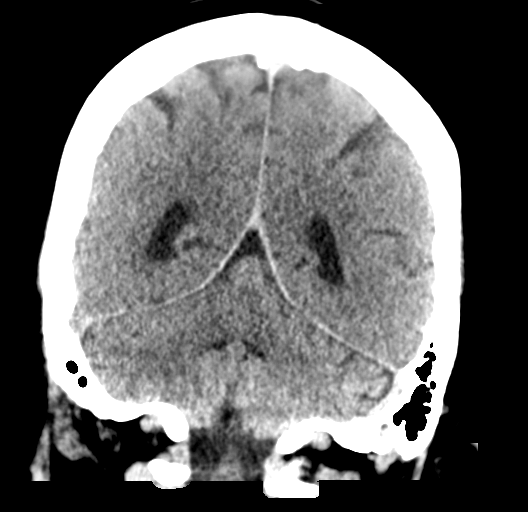
[im 30/67  brain]
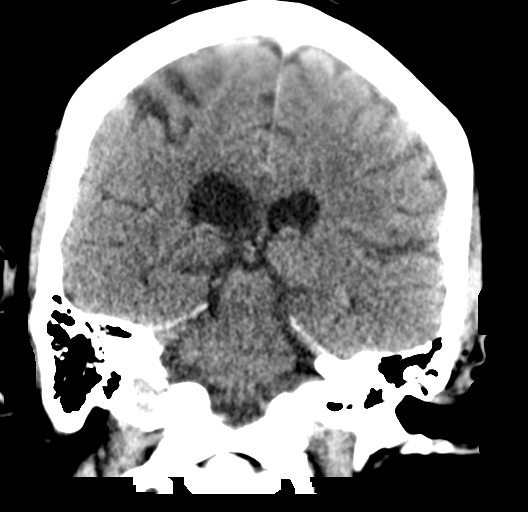
[im 37/67  brain]
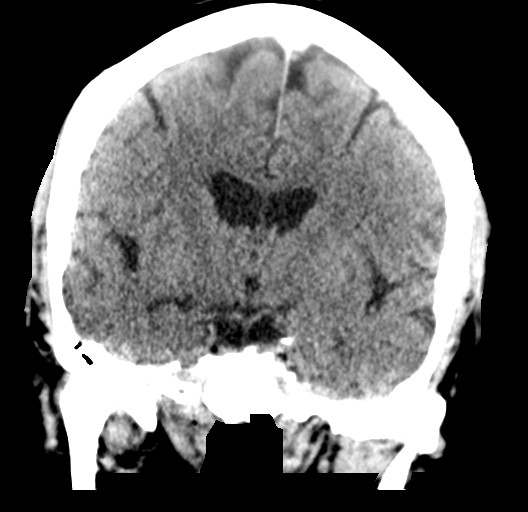

[Series 7: head with sag · sagittal · 0.32mm/px · 3 of 51 slices shown]
[im 17/51  brain]
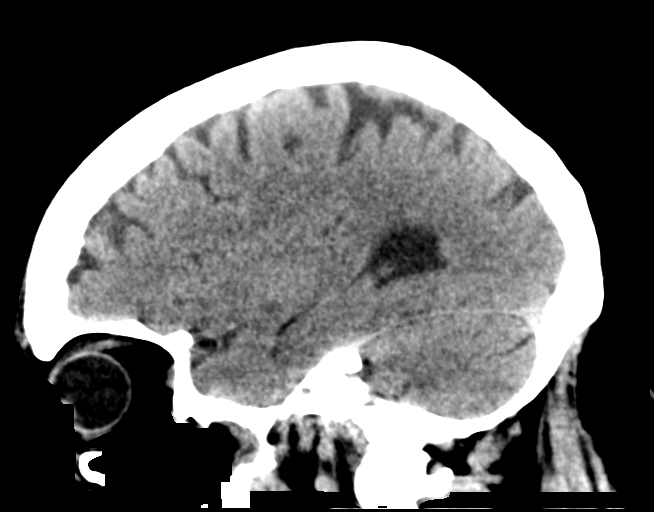
[im 26/51  brain]
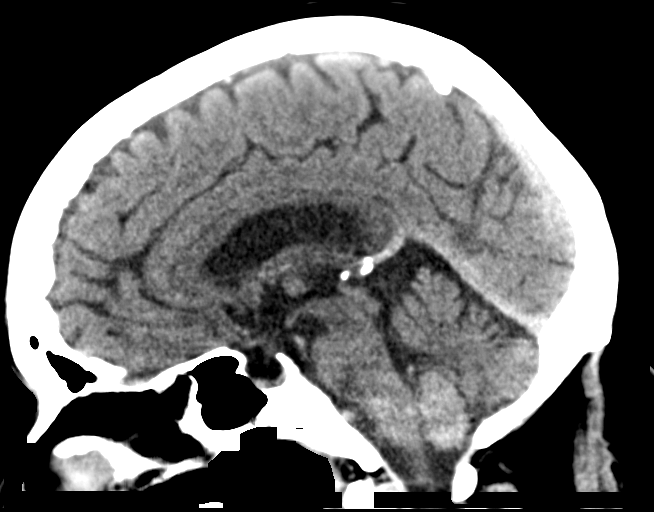
[im 34/51  brain]
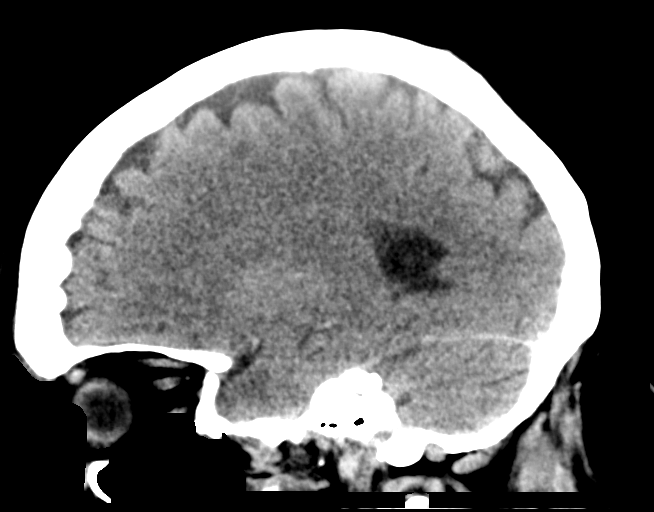

[15 of 47 positions shown; findings below may reference images not displayed]

FINDINGS: Brain: No midline shift, ventriculomegaly, mass effect, evidence of
mass lesion, intracranial hemorrhage or evidence of cortically based
acute infarction. Gray-white matter differentiation is within normal
limits throughout the brain. No abnormal enhancement identified.

Vascular: Calcified atherosclerosis at the skull base.

Skull: No osseous abnormality identified.

Sinuses/Orbits: Visualized paranasal sinuses and mastoids are stable
and well pneumatized.

Other: Visualized orbits and scalp soft tissues are within normal
limits.
IMPRESSION: No metastatic disease or acute intracranial abnormality. Normal for
age CT appearance of the brain.

## 2018-04-27 ENCOUNTER — Other Ambulatory Visit (HOSPITAL_COMMUNITY): Payer: Self-pay | Admitting: *Deleted

## 2018-04-27 DIAGNOSIS — C569 Malignant neoplasm of unspecified ovary: Secondary | ICD-10-CM

## 2018-04-28 MED ORDER — OXYCODONE-ACETAMINOPHEN 10-325 MG PO TABS: 1.0000 | ORAL_TABLET | ORAL | 0 refills | Status: DC | PRN

## 2018-05-02 ENCOUNTER — Inpatient Hospital Stay (HOSPITAL_COMMUNITY): Payer: Medicare HMO | Attending: Hematology

## 2018-05-02 ENCOUNTER — Other Ambulatory Visit (HOSPITAL_COMMUNITY): Payer: Self-pay | Admitting: *Deleted

## 2018-05-02 ENCOUNTER — Encounter (HOSPITAL_COMMUNITY): Payer: Self-pay | Admitting: Hematology

## 2018-05-02 ENCOUNTER — Inpatient Hospital Stay (HOSPITAL_COMMUNITY): Payer: Medicare HMO | Admitting: Hematology

## 2018-05-02 ENCOUNTER — Other Ambulatory Visit: Payer: Self-pay

## 2018-05-02 VITALS — BP 103/52 | HR 91 | Temp 98.0°F | Resp 18 | Wt 217.2 lb

## 2018-05-02 DIAGNOSIS — Z9221 Personal history of antineoplastic chemotherapy: Secondary | ICD-10-CM | POA: Insufficient documentation

## 2018-05-02 DIAGNOSIS — C7801 Secondary malignant neoplasm of right lung: Secondary | ICD-10-CM | POA: Insufficient documentation

## 2018-05-02 DIAGNOSIS — M7989 Other specified soft tissue disorders: Secondary | ICD-10-CM | POA: Insufficient documentation

## 2018-05-02 DIAGNOSIS — G620 Drug-induced polyneuropathy: Secondary | ICD-10-CM

## 2018-05-02 DIAGNOSIS — Z90710 Acquired absence of both cervix and uterus: Secondary | ICD-10-CM | POA: Insufficient documentation

## 2018-05-02 DIAGNOSIS — Z79811 Long term (current) use of aromatase inhibitors: Secondary | ICD-10-CM

## 2018-05-02 DIAGNOSIS — C7960 Secondary malignant neoplasm of unspecified ovary: Secondary | ICD-10-CM

## 2018-05-02 DIAGNOSIS — Z923 Personal history of irradiation: Secondary | ICD-10-CM | POA: Diagnosis not present

## 2018-05-02 DIAGNOSIS — C778 Secondary and unspecified malignant neoplasm of lymph nodes of multiple regions: Secondary | ICD-10-CM | POA: Diagnosis not present

## 2018-05-02 DIAGNOSIS — C7802 Secondary malignant neoplasm of left lung: Secondary | ICD-10-CM

## 2018-05-02 DIAGNOSIS — Z79899 Other long term (current) drug therapy: Secondary | ICD-10-CM | POA: Diagnosis not present

## 2018-05-02 DIAGNOSIS — R944 Abnormal results of kidney function studies: Secondary | ICD-10-CM | POA: Insufficient documentation

## 2018-05-02 DIAGNOSIS — C569 Malignant neoplasm of unspecified ovary: Secondary | ICD-10-CM

## 2018-05-02 DIAGNOSIS — C55 Malignant neoplasm of uterus, part unspecified: Secondary | ICD-10-CM

## 2018-05-02 LAB — COMPREHENSIVE METABOLIC PANEL
ALT: 12 U/L (ref 0–44)
ANION GAP: 13 (ref 5–15)
AST: 19 U/L (ref 15–41)
Albumin: 3.2 g/dL — ABNORMAL LOW (ref 3.5–5.0)
Alkaline Phosphatase: 62 U/L (ref 38–126)
BUN: 30 mg/dL — ABNORMAL HIGH (ref 8–23)
CALCIUM: 11.3 mg/dL — AB (ref 8.9–10.3)
CHLORIDE: 100 mmol/L (ref 98–111)
CO2: 24 mmol/L (ref 22–32)
CREATININE: 1.5 mg/dL — AB (ref 0.44–1.00)
GFR, EST AFRICAN AMERICAN: 40 mL/min — AB (ref >60)
GFR, EST NON AFRICAN AMERICAN: 35 mL/min — AB (ref >60)
Glucose, Bld: 132 mg/dL — ABNORMAL HIGH (ref 70–99)
Potassium: 4.1 mmol/L (ref 3.5–5.1)
Sodium: 137 mmol/L (ref 135–145)
Total Bilirubin: 0.5 mg/dL (ref 0.3–1.2)
Total Protein: 7.4 g/dL (ref 6.5–8.1)

## 2018-05-02 LAB — CBC WITH DIFFERENTIAL/PLATELET
ABS IMMATURE GRANULOCYTES: 0.02 10*3/uL (ref 0.00–0.07)
BASOS PCT: 0 %
Basophils Absolute: 0 10*3/uL (ref 0.0–0.1)
Eosinophils Absolute: 0.1 10*3/uL (ref 0.0–0.5)
Eosinophils Relative: 2 %
HCT: 34.3 % — ABNORMAL LOW (ref 36.0–46.0)
Hemoglobin: 10.5 g/dL — ABNORMAL LOW (ref 12.0–15.0)
Immature Granulocytes: 0 %
LYMPHS PCT: 21 %
Lymphs Abs: 1.8 10*3/uL (ref 0.7–4.0)
MCH: 30.8 pg (ref 26.0–34.0)
MCHC: 30.6 g/dL (ref 30.0–36.0)
MCV: 100.6 fL — AB (ref 80.0–100.0)
Monocytes Absolute: 0.6 10*3/uL (ref 0.1–1.0)
Monocytes Relative: 7 %
NEUTROS ABS: 5.7 10*3/uL (ref 1.7–7.7)
NEUTROS PCT: 70 %
NRBC: 0 % (ref 0.0–0.2)
PLATELETS: 187 10*3/uL (ref 150–400)
RBC: 3.41 MIL/uL — ABNORMAL LOW (ref 3.87–5.11)
RDW: 13.4 % (ref 11.5–15.5)
WBC: 8.3 10*3/uL (ref 4.0–10.5)

## 2018-05-02 LAB — LACTATE DEHYDROGENASE: LDH: 115 U/L (ref 98–192)

## 2018-05-02 MED ORDER — OXYCODONE HCL 5 MG PO TABS: 5.0000 mg | ORAL_TABLET | Freq: Four times a day (QID) | ORAL | 0 refills | Status: DC | PRN

## 2018-05-02 MED ORDER — PROCHLORPERAZINE MALEATE 10 MG PO TABS: 10.0000 mg | ORAL_TABLET | Freq: Four times a day (QID) | ORAL | 2 refills | Status: DC | PRN

## 2018-05-02 NOTE — Assessment & Plan Note (Signed)
1.  Recurrent ovarian cancer: - Status post TAH and BSO in 2014 followed by carboplatin and Taxol for 5 cycles, last cycle DC'd due to thrombocytopenia, followed by EBRT and brachii therapy. -PET imaging and December 2017 demonstrated progression, treated with systemic chemotherapy with carboplatin/gemcitabine from 08/03/2016 through April 2018, poorly tolerated secondary to cytopenias. - Foundation 1 results show MS stable, TMB intermediate, no other actionable mutations, PDL 1 of 0% - In May 2018, left supraclavicular lymph node biopsy consistent with metastatic adenocarcinoma, GYN primary, ER positive - Discussed the results of the PET CT scan dated 01/10/2018 which shows progression of disease, with a hypermetabolic left supraclavicular lymph node, 3 new lung nodules in both lungs, progression of retroperitoneal adenopathy. - Patient would like to have therapy for her cancer as long as she can tolerate it well.  Because of her severe neuropathy she would not be a candidate for most chemotherapeutic agents in this setting.  We can use single agent carboplatin as she is platinum sensitive.  Since her tumor is slowly growing, I have recommended antiestrogen therapy with anastrozole.  - She was started on anastrozole on 02/28/2018.  She is tolerating it very well. -Her creatinine was elevated today at 1.5.  Her blood pressure is borderline low.  I have told her to discontinue lisinopril.  She is taking Lasix 20 mg as needed for leg swellings.  Her Ca1 25 from today's pending.  Ca1 25 on 02/28/2018 was 332. - I have recommended repeating CT scan of the chest, abdomen and pelvis and 4 weeks to evaluate response.  2.  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 -She is taking Percocet 10 mg every 4 hours.  Sometimes she gets pain within 2 hours after taking Percocet.  She has tried OxyContin 20 mg twice daily in the past but could not tolerate it.  She has  been taking half tablet of Percocet in between.  She is running out of the pain medicine early. -I will give her prescription for oxycodone 5 mg to be taken in between the Percocet 10 mg tablets.  She does not want to try any long-acting opioids secondary to her intolerance to OxyContin.

## 2018-05-02 NOTE — Patient Instructions (Signed)
[facility] at [facility] Discharge Instructions     Thank you for choosing [facility] at [facility] to provide your oncology and hematology care.  To afford each patient quality time with our provider, please arrive at least 15 minutes before your scheduled appointment time.   If you have a lab appointment with the Cancer Center please come in thru the  Main Entrance and check in at the main information desk  You need to re-schedule your appointment should you arrive 10 or more minutes late.  We strive to give you quality time with our providers, and arriving late affects you and other patients whose appointments are after yours.  Also, if you no show three or more times for appointments you may be dismissed from the clinic at the providers discretion.     Again, thank you for choosing [facility].  Our hope is that these requests will decrease the amount of time that you wait before being seen by our physicians.       _____________________________________________________________  Should you have questions after your visit to [facility], please contact our office at [phone] between the hours of 8:00 a.m. and 4:30 p.m.  Voicemails left after 4:00 p.m. will not be returned until the following business day.  For prescription refill requests, have your pharmacy contact our office and allow 72 hours.    Cancer Center Support Programs:   > Cancer Support Group  2nd Tuesday of the month 1pm-2pm, Journey Room    

## 2018-05-02 NOTE — Progress Notes (Signed)
Kathleen [facility], [place] [id number]   CLINIC:  Medical Oncology/Hematology  PCP:  Kath[facility] 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    08/15/2013 - 12/06/2013 Chemotherapy    Carboplatin/Taxol with dose reduction of 50% and Taxol due to grade 2+ peripheral neuropathy and gabapentin induced nightmare/hallucinations. 5 out of 6 cycles given with the final cycle being cancelled secondary to significant thrombocytopenia/intolerance.    09/10/2013 - 10/17/2013 Radiation Therapy    5040 cGy delivered by EBRT followed by intravaginal brachytherapy.    04/30/2014 PET scan    PET scan consistent with recurrent disease of either ovarian cancer or endometrial cancer.    06/03/2014 - 07/15/2014 Radiation Therapy    5040 cGy over 28 fractions delivered to the low periaortic area lymph node either metastatic endometrial or metastatic or ovarian cancer.    07/19/2014 Imaging    Imaging concerning for new mesenteric disease and subdiaphragmatic disease.    08/05/2014 PET scan    No evidence of progression of disease.    08/05/2014 Remission    Negative PET scan.    06/11/2016 PET scan    1. Hypermetabolic aortocaval and right common iliac adenopathy, maximum SUV 17.1, compatible with malignancy. 2. Calcified left mesenteric mass has only low-grade activity, maximum SUV 5.3, merit surveillance. 3. There several small calcifications along the omentum which are not currently metabolically active. 4. Along the scarring in operative site from prior  laparotomy there some faintly accentuated metabolic activity which is probably related to the original wound and unlikely to be from tumor. 5. Coronary, aortic arch, and branch vessel atherosclerotic vascular disease.    06/11/2016 Relapse/Recurrence    PET scan with aortocaval & right iliac adenopathy consistent with malignancy.     08/03/2016 -  Chemotherapy    Carboplatin Day 1/Gemzar Days 1&8 every 21 days     08/10/2016 Treatment Plan Change    Day 8 cycle 1 cancelled due to cytopenia    09/07/2016 Treatment Plan Change    Treatment delayed x 1 week due to thrombocytopenia.  Hypomagnesemia noted and replaced IV.    09/08/2016 - 09/12/2016 Hospital Admission    Admit date: 09/08/2016  Admission diagnosis: Seizure Additional comments: Intubated on 3/7 for airway protection and extubated on 09/09/2016.  Hospital course complicated by pancytopenia secondary to systemic chemotherapy.    09/08/2016 Imaging    EEG- This sedated EEG is abnormal due to diffuse slowing of the waking background.  Clinical Correlation of the above findings indicates diffuse cerebral dysfunction that is non-specific in etiology and can be seen with hypoxic/ischemic injury, toxic/metabolic encephalopathies, neurodegenerative disorders, or medication effect.  The absence of epileptiform discharges does not rule out a clinical diagnosis of epilepsy.  Clinical correlation is advised.    09/12/2016 Imaging    CT head- No metastatic disease or acute intracranial abnormality. Normal for age CT appearance of the brain.    09/21/2016 Treatment Plan Change    Carboplatin deleted from treatment plan.  Will pursue single-agent Gemcitabine at reduced dose.    11/19/2016  Relapse/Recurrence    Diagnosis Lymph node, needle/core biopsy, left supraclavicular METASTATIC ADENOCARCINOMA, CONSISTENT WITH GYNECOLOGICAL PRIMARY.      INTERVAL HISTORY:  [name] 67 y.o. female returns for routine follow-up for metastatic ovarian  cancer. Patient is here with her husband today. She has been taking the medication and has a some side effects. She is having diarrhea daily. She also has mild abdominal pain and cramping. Her fatigued is still constant and throughout the day. She was on an antibiotics about a month ago. She denies any new pain. Denies any nausea or vomiting. Denies any bleeding or dark stools. Denies any mouth sores or skin rashes. Patient reports her appetite at 50%. She is forcing herself to eat due to her appetite. Her energy level is 50%.     REVIEW OF SYSTEMS:  Review of Systems  Constitutional: Positive for fatigue.  Respiratory: Positive for shortness of breath.   Gastrointestinal: Positive for diarrhea and nausea.  Neurological: Positive for numbness.  All other systems reviewed and are negative.    PAST MEDICAL/SURGICAL HISTORY:  Past Medical History:  Diagnosis Date  . Anemia   . Asthma   . Chemotherapy induced neutropenia (Kathleen [facility]head)   . Chemotherapy induced thrombocytopenia   . Diabetes mellitus without complication (Kathleen Whitehead)   . Endometrial cancer (Kathleen Whitehead)   . Hot flashes   . Hypertension   . Malignant neoplasm (Kathleen Whitehead)   . Malignant neoplasm of ovary (Kathleen Whitehead) 04/30/2013  . Obesity   . Ovarian cancer (Kathleen Whitehead)   . Pancytopenia (Kathleen Whitehead)   . Peripheral artery disease (Kathleen Whitehead)   . Peripheral neuropathy   . Pneumonia 2015  . Port-A-Cath in place   . Seizures (Kathleen Whitehead) 09/08/2016   ?? due to low magnesium   Past Surgical History:  Procedure Laterality Date  . ABDOMINAL HYSTERECTOMY  2014     SOCIAL HISTORY:  Social History   Socioeconomic History  . Marital status: Married    Spouse name: Not on file  . Number of children: Not on file  . Years of education: Not on file  . Highest education level: Not on file  Occupational History  . Not on file  Social Needs  . Financial resource strain: Not on file  . Food insecurity:    Worry: Not on file    Inability: Not on file  . Transportation needs:     Medical: Not on file    Non-medical: Not on file  Tobacco Use  . Smoking status: Never Smoker  . Smokeless tobacco: Never Used  Substance and Sexual Activity  . Alcohol use: No  . Drug use: No  . Sexual activity: Not Currently  Lifestyle  . Physical activity:    Days per week: Not on file    Minutes per session: Not on file  . Stress: Not on file  Relationships  . Social connections:    Talks on phone: Not on file    Gets together: Not on file    Attends religious service: Not on file    Active member of club or organization: Not on file    Attends meetings of clubs or organizations: Not on file    Relationship status: Not on file  . Intimate partner violence:    Fear of current or ex partner: Not on file    Emotionally abused: Not on file    Physically abused: Not on file    Forced sexual activity: Not on file  Other Topics Concern  . Not on file  Social History Narrative  . Not on file    FAMILY HISTORY:  Family History  Problem Relation Age of Onset  . Heart failure Mother   . Stroke Sister   . Cancer Sister   . Diabetes Sister   . Leukemia Sister   . Diabetes Brother     CURRENT MEDICATIONS:  Outpatient Encounter Medications as of 05/02/2018  Medication Sig  . acetaminophen (TYLENOL) 500 MG tablet Take 500 mg by mouth every 6 (six) hours as needed for mild pain or moderate pain.   Marland Kitchen anastrozole (ARIMIDEX) 1 MG tablet Take 1 tablet (1 mg total) by mouth daily.  Marland Kitchen aspirin EC 325 MG tablet Take 325 mg by mouth daily.   . calcium carbonate (TUMS EX) 750 MG chewable tablet Chew 1 tablet by mouth daily as needed for heartburn.  . calcium carbonate (TUMS SMOOTHIES) 750 MG chewable tablet Chew 1 tablet by mouth daily as needed for heartburn.  . cetirizine (ZYRTEC) 10 MG tablet Take 10 mg by mouth daily.   . Cholecalciferol (VITAMIN D PO) Take 1 tablet by mouth daily.  . cyanocobalamin 1000 MCG tablet Take 1,000 mcg by mouth daily.  . cyclobenzaprine (FLEXERIL) 10 MG  tablet Take 10 mg by mouth 3 (three) times daily as needed for muscle spasms.  . furosemide (LASIX) 20 MG tablet Take 1 tablet (20 mg total) by mouth daily.  Marland Kitchen glipiZIDE (GLUCOTROL) 10 MG tablet TAKE 1 TABLET BY MOUTH TWICE A DAY  . levETIRAcetam (KEPPRA) 500 MG tablet Take 1 tablet (500 mg total) by mouth 2 (two) times daily.  Marland Kitchen lisinopril (PRINIVIL,ZESTRIL) 40 MG tablet Take 40 mg by mouth daily.   Marland Kitchen loperamide (IMODIUM) 2 MG capsule Take 2 mg by mouth as needed for diarrhea or loose stools.   . magnesium oxide (MAG-OX) 400 MG tablet TAKE 1 TABLET BY MOUTH TWICE A DAY  . metFORMIN (GLUCOPHAGE) 1000 MG tablet Take 1,000 mg by mouth 2 (two) times daily with a meal.   . Omega-3 Fatty Acids (FISH OIL) 1000 MG CAPS Take 1,000 mg by mouth daily.   . ondansetron (ZOFRAN) 8 MG tablet Take 1 tablet (8 mg total) by mouth every 8 (eight) hours as needed for nausea or vomiting.  . ondansetron (ZOFRAN-ODT) 4 MG disintegrating tablet Take 4 mg by mouth every 8 (eight) hours as needed for nausea or vomiting.   Marland Kitchen oxyCODONE (OXY IR/ROXICODONE) 5 MG immediate release tablet Take 1 tablet (5 mg total) by mouth every 6 (six) hours as needed for severe pain.  Marland Kitchen oxyCODONE-acetaminophen (PERCOCET) 10-325 MG tablet Take 1 tablet by mouth every 4 (four) hours as needed for pain.  . Polyethylene Glycol 3350 (MIRALAX PO) Take 17 g by mouth daily as needed for moderate constipation.   . prochlorperazine (COMPAZINE) 10 MG tablet TAKE 1 TABLET (10 MG TOTAL) BY MOUTH EVERY 6 (SIX) HOURS AS NEEDED FOR NAUSEA OR VOMITING.  . simethicone (GAS-X) 80 MG chewable tablet Chew 80 mg by mouth every 6 (six) hours as needed for flatulence.   No facility-administered encounter medications on file as of 05/02/2018.     ALLERGIES:  Allergies  Allergen Reactions  . Diphenhydramine Palpitations  . Iodinated Diagnostic Agents Other (See Comments)    Unknown  . Duloxetine Hcl Nausea And Vomiting  . Ibuprofen Other (See Comments)     Makes my bones hurt  . Penicillins Rash    Has patient had a PCN reaction causing immediate rash, facial/tongue/throat swelling, SOB or lightheadedness  with hypotension: No Has patient had a PCN reaction causing severe rash involving mucus membranes or skin necrosis: No Has patient had a PCN reaction that required hospitalization: No Has patient had a PCN reaction occurring within the last 10 years: No If all of the above answers are "NO", then may proceed with Cephalosporin use.      PHYSICAL EXAM:  ECOG Performance status: 2  Vitals:   05/02/18 1437  BP: (!) 103/52  Pulse: 91  Resp: 18  Temp: 98 F (36.7 C)  SpO2: 97%   Filed Weights   05/02/18 1437  Weight: 217 lb 3.2 oz (98.5 kg)    Physical Exam  Constitutional: She is oriented to person, place, and time. She appears well-developed and well-nourished.  Musculoskeletal: Normal range of motion.  Neurological: She is alert and oriented to person, place, and time.  Skin: Skin is warm and dry.  Psychiatric: She has a normal mood and affect. Her behavior is normal. Judgment and thought content normal.  Abdomen: Soft, distended, nontender with no palpable masses. Extremities: 1+ edema bilaterally.  LABORATORY DATA:  I have reviewed the labs as listed.  CBC    Component Value Date/Time   WBC 8.3 05/02/2018 1347   RBC 3.41 (L) 05/02/2018 1347   HGB 10.5 (L) 05/02/2018 1347   HCT 34.3 (L) 05/02/2018 1347   PLT 187 05/02/2018 1347   MCV 100.6 (H) 05/02/2018 1347   MCH 30.8 05/02/2018 1347   MCHC 30.6 05/02/2018 1347   RDW 13.4 05/02/2018 1347   LYMPHSABS 1.8 05/02/2018 1347   MONOABS 0.6 05/02/2018 1347   EOSABS 0.1 05/02/2018 1347   BASOSABS 0.0 05/02/2018 1347   CMP Latest Ref Rng & Units 05/02/2018 02/28/2018 02/18/2018  Glucose 70 - 99 mg/dL 132(H) 201(H) 225(H)  BUN 8 - 23 mg/dL 30(H) 23 22  Creatinine 0.44 - 1.00 mg/dL 1.50(H) 1.24(H) 1.24(H)  Sodium 135 - 145 mmol/L 137 138 138  Potassium 3.5 - 5.1 mmol/L  4.1 4.3 4.1  Chloride 98 - 111 mmol/L 100 102 101  CO2 22 - 32 mmol/L '24 27 26  '$ Calcium 8.9 - 10.3 mg/dL 11.3(H) 9.1 10.3  Total Protein 6.5 - 8.1 g/dL 7.4 7.2 -  Total Bilirubin 0.3 - 1.2 mg/dL 0.5 0.4 -  Alkaline Phos 38 - 126 U/L 62 58 -  AST 15 - 41 U/L 19 15 -  ALT 0 - 44 U/L 12 13 -       ASSESSMENT & PLAN:   Disseminated ovarian cancer (HCC) 1.  Recurrent ovarian cancer: - Status post TAH and BSO in 2014 followed by carboplatin and Taxol for 5 cycles, last cycle DC'd due to thrombocytopenia, followed by EBRT and brachii therapy. -PET imaging and December 2017 demonstrated progression, treated with systemic chemotherapy with carboplatin/gemcitabine from 08/03/2016 through April 2018, poorly tolerated secondary to cytopenias. - Foundation 1 results show MS stable, TMB intermediate, no other actionable mutations, PDL 1 of 0% - In May 2018, left supraclavicular lymph node biopsy consistent with metastatic adenocarcinoma, GYN primary, ER positive - Discussed the results of the PET CT scan dated 01/10/2018 which shows progression of disease, with a hypermetabolic left supraclavicular lymph node, 3 new lung nodules in both lungs, progression of retroperitoneal adenopathy. - Patient would like to have therapy for her cancer as long as she can tolerate it well.  Because of her severe neuropathy she would not be a candidate for most chemotherapeutic agents in this setting.  We can use single agent  carboplatin as she is platinum sensitive.  Since her tumor is slowly growing, I have recommended antiestrogen therapy with anastrozole.  - She was started on anastrozole on 02/28/2018.  She is tolerating it very well. -Her creatinine was elevated today at 1.5.  Her blood pressure is borderline low.  I have told her to discontinue lisinopril.  She is taking Lasix 20 mg as needed for leg swellings.  Her Ca1 25 from today's pending.  Ca1 25 on 02/28/2018 was 332. - I have recommended repeating CT scan of the  chest, abdomen and pelvis and 4 weeks to evaluate response.  2.  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 -She is taking Percocet 10 mg every 4 hours.  Sometimes she gets pain within 2 hours after taking Percocet.  She has tried OxyContin 20 mg twice daily in the past but could not tolerate it.  She has been taking half tablet of Percocet in between.  She is running out of the pain medicine early. -I will give her prescription for oxycodone 5 mg to be taken in between the Percocet 10 mg tablets.  She does not want to try any long-acting opioids secondary to her intolerance to OxyContin.      Orders placed this encounter:  Orders Placed This Encounter  Procedures  . CT Chest W Contrast  . CT Abdomen Pelvis W Contrast  . Lactate dehydrogenase  . CBC with Differential/Platelet  . Comprehensive metabolic panel  . CA [place], [facility] [place] [phone]

## 2018-05-03 LAB — CA 125: CANCER ANTIGEN (CA) 125: 443 U/mL — AB (ref 0.0–38.1)

## 2018-05-24 ENCOUNTER — Other Ambulatory Visit (HOSPITAL_COMMUNITY): Payer: Self-pay | Admitting: *Deleted

## 2018-05-24 DIAGNOSIS — C569 Malignant neoplasm of unspecified ovary: Secondary | ICD-10-CM

## 2018-05-25 MED ORDER — OXYCODONE HCL 5 MG PO TABS: 5.0000 mg | ORAL_TABLET | Freq: Four times a day (QID) | ORAL | 0 refills | Status: DC | PRN

## 2018-05-29 ENCOUNTER — Other Ambulatory Visit (HOSPITAL_COMMUNITY): Payer: Self-pay | Admitting: *Deleted

## 2018-05-29 DIAGNOSIS — C569 Malignant neoplasm of unspecified ovary: Secondary | ICD-10-CM

## 2018-05-29 MED ORDER — OXYCODONE-ACETAMINOPHEN 10-325 MG PO TABS: 1.0000 | ORAL_TABLET | ORAL | 0 refills | Status: DC | PRN

## 2018-06-05 ENCOUNTER — Inpatient Hospital Stay (HOSPITAL_COMMUNITY): Payer: Medicare HMO | Attending: Hematology

## 2018-06-05 ENCOUNTER — Ambulatory Visit (HOSPITAL_COMMUNITY)
Admission: RE | Admit: 2018-06-05 | Discharge: 2018-06-05 | Disposition: A | Discharge status: Home / Self Care | Payer: Medicare HMO | Source: Ambulatory Visit | Attending: Nurse Practitioner | Admitting: Nurse Practitioner

## 2018-06-05 DIAGNOSIS — Z79899 Other long term (current) drug therapy: Secondary | ICD-10-CM | POA: Insufficient documentation

## 2018-06-05 DIAGNOSIS — Z9221 Personal history of antineoplastic chemotherapy: Secondary | ICD-10-CM | POA: Insufficient documentation

## 2018-06-05 DIAGNOSIS — C7801 Secondary malignant neoplasm of right lung: Secondary | ICD-10-CM | POA: Diagnosis not present

## 2018-06-05 DIAGNOSIS — K629 Disease of anus and rectum, unspecified: Secondary | ICD-10-CM | POA: Diagnosis not present

## 2018-06-05 DIAGNOSIS — Z90722 Acquired absence of ovaries, bilateral: Secondary | ICD-10-CM | POA: Diagnosis not present

## 2018-06-05 DIAGNOSIS — G620 Drug-induced polyneuropathy: Secondary | ICD-10-CM | POA: Diagnosis not present

## 2018-06-05 DIAGNOSIS — C778 Secondary and unspecified malignant neoplasm of lymph nodes of multiple regions: Secondary | ICD-10-CM | POA: Insufficient documentation

## 2018-06-05 DIAGNOSIS — R1900 Intra-abdominal and pelvic swelling, mass and lump, unspecified site: Secondary | ICD-10-CM | POA: Diagnosis not present

## 2018-06-05 DIAGNOSIS — Z90710 Acquired absence of both cervix and uterus: Secondary | ICD-10-CM | POA: Insufficient documentation

## 2018-06-05 DIAGNOSIS — C569 Malignant neoplasm of unspecified ovary: Secondary | ICD-10-CM | POA: Insufficient documentation

## 2018-06-05 DIAGNOSIS — R590 Localized enlarged lymph nodes: Secondary | ICD-10-CM | POA: Insufficient documentation

## 2018-06-05 DIAGNOSIS — R918 Other nonspecific abnormal finding of lung field: Secondary | ICD-10-CM | POA: Insufficient documentation

## 2018-06-05 DIAGNOSIS — T451X5A Adverse effect of antineoplastic and immunosuppressive drugs, initial encounter: Secondary | ICD-10-CM | POA: Diagnosis not present

## 2018-06-05 DIAGNOSIS — C7800 Secondary malignant neoplasm of unspecified lung: Secondary | ICD-10-CM | POA: Diagnosis not present

## 2018-06-05 DIAGNOSIS — C7802 Secondary malignant neoplasm of left lung: Secondary | ICD-10-CM | POA: Insufficient documentation

## 2018-06-05 DIAGNOSIS — Z5111 Encounter for antineoplastic chemotherapy: Secondary | ICD-10-CM | POA: Diagnosis present

## 2018-06-05 LAB — CBC WITH DIFFERENTIAL/PLATELET
ABS IMMATURE GRANULOCYTES: 0.02 10*3/uL (ref 0.00–0.07)
Basophils Absolute: 0.1 10*3/uL (ref 0.0–0.1)
Basophils Relative: 1 %
Eosinophils Absolute: 0.1 10*3/uL (ref 0.0–0.5)
Eosinophils Relative: 1 %
HEMATOCRIT: 36.7 % (ref 36.0–46.0)
Hemoglobin: 11.3 g/dL — ABNORMAL LOW (ref 12.0–15.0)
IMMATURE GRANULOCYTES: 0 %
LYMPHS ABS: 1.6 10*3/uL (ref 0.7–4.0)
Lymphocytes Relative: 21 %
MCH: 31.1 pg (ref 26.0–34.0)
MCHC: 30.8 g/dL (ref 30.0–36.0)
MCV: 101.1 fL — AB (ref 80.0–100.0)
MONO ABS: 0.5 10*3/uL (ref 0.1–1.0)
MONOS PCT: 6 %
NEUTROS ABS: 5.5 10*3/uL (ref 1.7–7.7)
NEUTROS PCT: 71 %
Platelets: 204 10*3/uL (ref 150–400)
RBC: 3.63 MIL/uL — ABNORMAL LOW (ref 3.87–5.11)
RDW: 13.6 % (ref 11.5–15.5)
WBC: 7.8 10*3/uL (ref 4.0–10.5)
nRBC: 0 % (ref 0.0–0.2)

## 2018-06-05 LAB — COMPREHENSIVE METABOLIC PANEL
ALT: 14 U/L (ref 0–44)
AST: 19 U/L (ref 15–41)
Albumin: 3.4 g/dL — ABNORMAL LOW (ref 3.5–5.0)
Alkaline Phosphatase: 59 U/L (ref 38–126)
Anion gap: 11 (ref 5–15)
BILIRUBIN TOTAL: 0.7 mg/dL (ref 0.3–1.2)
BUN: 28 mg/dL — AB (ref 8–23)
CALCIUM: 10.3 mg/dL (ref 8.9–10.3)
CHLORIDE: 102 mmol/L (ref 98–111)
CO2: 26 mmol/L (ref 22–32)
CREATININE: 1.45 mg/dL — AB (ref 0.44–1.00)
GFR calc Af Amer: 43 mL/min — ABNORMAL LOW (ref >60)
GFR, EST NON AFRICAN AMERICAN: 37 mL/min — AB (ref >60)
Glucose, Bld: 207 mg/dL — ABNORMAL HIGH (ref 70–99)
Potassium: 4 mmol/L (ref 3.5–5.1)
Sodium: 139 mmol/L (ref 135–145)
TOTAL PROTEIN: 7.7 g/dL (ref 6.5–8.1)

## 2018-06-05 LAB — LACTATE DEHYDROGENASE: LDH: 141 U/L (ref 98–192)

## 2018-06-05 MED ORDER — IOPAMIDOL (ISOVUE-300) INJECTION 61%
100.0000 mL | Freq: Once | INTRAVENOUS | Status: AC | PRN
  Administered 2018-06-05: 75 mL via INTRAVENOUS

## 2018-06-06 LAB — CA 125: CANCER ANTIGEN (CA) 125: 592 U/mL — AB (ref 0.0–38.1)

## 2018-06-09 ENCOUNTER — Encounter (HOSPITAL_COMMUNITY): Payer: Self-pay | Admitting: Hematology

## 2018-06-09 ENCOUNTER — Inpatient Hospital Stay (HOSPITAL_COMMUNITY): Payer: Medicare HMO | Admitting: Hematology

## 2018-06-09 ENCOUNTER — Other Ambulatory Visit: Payer: Self-pay

## 2018-06-09 DIAGNOSIS — Z90722 Acquired absence of ovaries, bilateral: Secondary | ICD-10-CM | POA: Diagnosis not present

## 2018-06-09 DIAGNOSIS — Z5111 Encounter for antineoplastic chemotherapy: Secondary | ICD-10-CM | POA: Diagnosis not present

## 2018-06-09 DIAGNOSIS — G620 Drug-induced polyneuropathy: Secondary | ICD-10-CM

## 2018-06-09 DIAGNOSIS — C569 Malignant neoplasm of unspecified ovary: Secondary | ICD-10-CM | POA: Diagnosis not present

## 2018-06-09 DIAGNOSIS — C7802 Secondary malignant neoplasm of left lung: Secondary | ICD-10-CM | POA: Diagnosis not present

## 2018-06-09 DIAGNOSIS — C7801 Secondary malignant neoplasm of right lung: Secondary | ICD-10-CM

## 2018-06-09 DIAGNOSIS — C778 Secondary and unspecified malignant neoplasm of lymph nodes of multiple regions: Secondary | ICD-10-CM

## 2018-06-09 DIAGNOSIS — Z9221 Personal history of antineoplastic chemotherapy: Secondary | ICD-10-CM | POA: Diagnosis not present

## 2018-06-09 DIAGNOSIS — Z79899 Other long term (current) drug therapy: Secondary | ICD-10-CM | POA: Diagnosis not present

## 2018-06-09 DIAGNOSIS — Z90710 Acquired absence of both cervix and uterus: Secondary | ICD-10-CM

## 2018-06-09 DIAGNOSIS — T451X5A Adverse effect of antineoplastic and immunosuppressive drugs, initial encounter: Secondary | ICD-10-CM | POA: Diagnosis not present

## 2018-06-09 NOTE — Assessment & Plan Note (Signed)
1.  Recurrent ovarian cancer: - Status post TAH and BSO in 2014 followed by carboplatin and Taxol for 5 cycles, last cycle DC'd due to thrombocytopenia, followed by EBRT and brachii therapy. -PET imaging and December 2017 demonstrated progression, treated with systemic chemotherapy with carboplatin/gemcitabine from 08/03/2016 through April 2018, poorly tolerated secondary to cytopenias. - Foundation 1 results show MS stable, TMB intermediate, no other actionable mutations, PDL 1 of 0% - In May 2018, left supraclavicular lymph node biopsy consistent with metastatic adenocarcinoma, GYN primary, ER positive - Discussed the results of the PET CT scan dated 01/10/2018 which shows progression of disease, with a hypermetabolic left supraclavicular lymph node, 3 new lung nodules in both lungs, progression of retroperitoneal adenopathy. - Patient would like to have therapy for her cancer as long as she can tolerate it well.  Because of her severe neuropathy she would not be a candidate for most chemotherapeutic agents in this setting.  We can use single agent carboplatin as she is platinum sensitive.  Since her tumor is slowly growing, I have recommended antiestrogen therapy with anastrozole.  - She was started on anastrozole on 02/28/2018.  She is tolerating it very well. -We have discontinued lisinopril on 05/02/2018.  She is taking anastrozole continuously without missing doses. - I reviewed the results of the CT scan of the CAP dated 06/05/2018 which shows continued progression of the left supraclavicular lymph node, now measuring 3 cm, compared to 2.2 cm previously. - Bilateral pulmonary nodules are stable in the interval.  No new nodules seen.  Similar appearance of para-aortic retroperitoneal lymphadenopathy.  Stable 3.1 cm calcified mesenteric mass. - We also discussed the results of Ca1 25, which has increased to 592.  This was 332 when anastrozole was started. -Based on these findings, I have recommended  change in therapy.  Anastrozole is not working.  We will discontinue it. -We discussed single agent carboplatin AUC 5 every 21 days.  She has received it twice in the past and was sensitive to it.  Last treatment with gemcitabine was discontinued secondary to cytopenias.  We talked about the side effects in detail. - We will plan to start her on carboplatin next week.  I will see her back in 3 weeks after the first cycle.  2.  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 -She is taking Percocet 10 mg every 4 hours.  Sometimes she gets pain within 2 hours after taking Percocet.  She has tried OxyContin 20 mg twice daily in the past but could not tolerate it.  She has been taking half tablet of Percocet in between.  She is running out of the pain medicine early. - She is also taking oxycodone 5 mg as needed in between the Percocet tablets for breakthrough pain.

## 2018-06-09 NOTE — Progress Notes (Signed)
[facility], [place] [address]   CLINIC:  Medical Oncology/Hematology  PCP:  [name], MD [address] [place] [address] [phone]   REASON FOR VISIT: Follow-up for metastatic ovarian cacner  CURRENT THERAPY: Anastrozole.  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 [name] and associates, pathology could not be certain that these were not 2 separate primaries rather than one metastatic process from the uterus to the ovary.    08/15/2013 - 12/06/2013 Chemotherapy    Carboplatin/Taxol with dose reduction of 50% and Taxol due to grade 2+ peripheral neuropathy and gabapentin induced nightmare/hallucinations. 5 out of 6 cycles given with the final cycle being cancelled secondary to significant thrombocytopenia/intolerance.    09/10/2013 - 10/17/2013 Radiation Therapy    5040 cGy delivered by EBRT followed by intravaginal brachytherapy.    04/30/2014 PET scan    PET scan consistent with recurrent disease of either ovarian cancer or endometrial cancer.    06/03/2014 - 07/15/2014 Radiation Therapy    5040 cGy over 28 fractions delivered to the low periaortic area lymph node either metastatic endometrial or metastatic or ovarian cancer.    07/19/2014 Imaging    Imaging concerning for new mesenteric disease and subdiaphragmatic disease.    08/05/2014 PET scan    No evidence of progression of disease.    08/05/2014 Remission    Negative PET scan.    06/11/2016 PET scan    1. Hypermetabolic aortocaval and right common iliac adenopathy, maximum SUV 17.1, compatible with malignancy. 2. Calcified left mesenteric mass has only low-grade activity, maximum SUV 5.3, merit surveillance. 3. There several small calcifications along the omentum which are not currently metabolically active. 4. Along the scarring in operative site from prior  laparotomy there some faintly accentuated metabolic activity which is probably related to the original wound and unlikely to be from tumor. 5. Coronary, aortic arch, and branch vessel atherosclerotic vascular disease.    06/11/2016 Relapse/Recurrence    PET scan with aortocaval & right iliac adenopathy consistent with malignancy.     08/03/2016 -  Chemotherapy    Carboplatin Day 1/Gemzar Days 1&8 every 21 days     08/10/2016 Treatment Plan Change    Day 8 cycle 1 cancelled due to cytopenia    09/07/2016 Treatment Plan Change    Treatment delayed x 1 week due to thrombocytopenia.  Hypomagnesemia noted and replaced IV.    09/08/2016 - 09/12/2016 Hospital Admission    Admit date: 09/08/2016  Admission diagnosis: Seizure Additional comments: Intubated on 3/7 for airway protection and extubated on 09/09/2016.  Hospital course complicated by pancytopenia secondary to systemic chemotherapy.    09/08/2016 Imaging    EEG- This sedated EEG is abnormal due to diffuse slowing of the waking background.  Clinical Correlation of the above findings indicates diffuse cerebral dysfunction that is non-specific in etiology and can be seen with hypoxic/ischemic injury, toxic/metabolic encephalopathies, neurodegenerative disorders, or medication effect.  The absence of epileptiform discharges does not rule out a clinical diagnosis of epilepsy.  Clinical correlation is advised.    09/12/2016 Imaging    CT head- No metastatic disease or acute intracranial abnormality. Normal for age CT appearance of the brain.    09/21/2016 Treatment Plan Change    Carboplatin deleted from treatment plan.  Will pursue single-agent Gemcitabine at reduced dose.    11/19/2016 Relapse/Recurrence  Diagnosis Lymph node, needle/core biopsy, left supraclavicular METASTATIC ADENOCARCINOMA, CONSISTENT WITH GYNECOLOGICAL PRIMARY.    06/13/2018 -  Chemotherapy    The patient had PALONOSETRON HCL INJECTION 0.25 MG/5ML, 0.25 mg, Intravenous,   Once, 0 of 6 cycles FOSAPREPITANT 150MG + DEXAMETHASONE INFUSION [facility], , Intravenous,  Once, 0 of 6 cycles CARBOplatin (PARAPLATIN) in sodium chloride 0.9 % 100 mL chemo infusion, , Intravenous,  Once, 0 of 6 cycles  for chemotherapy treatment.       CANCER STAGING: Cancer Staging No matching staging information was found for the patient.   INTERVAL HISTORY:  Ms. Morency 67 y.o. female returns for follow-up of ovarian cancer.  She is continuing to tolerate anastrozole very well.  Complains of fatigue.  Energy levels and appetite are 50%.  Neuropathic pain in the feet and hands is constant.  She is taking Percocet every 4 hours.  She also takes oxycodone 5 mg in between Percocet tablets as needed for breakthrough.  She does have constipation alternating with diarrhea.  She also has occasional nausea.  Zofran was discontinued.  She takes Compazine as needed.  She does report some sleep problems.  Denies any fevers or infections.   REVIEW OF SYSTEMS:  Review of Systems  Constitutional: Positive for fatigue.  Gastrointestinal: Positive for constipation, diarrhea, nausea and vomiting.  Neurological: Positive for headaches and numbness.  Psychiatric/Behavioral: Positive for sleep disturbance.  All other systems reviewed and are negative.    PAST MEDICAL/SURGICAL HISTORY:  Past Medical History:  Diagnosis Date  . Anemia   . Asthma   . Chemotherapy induced neutropenia ([place])   . Chemotherapy induced thrombocytopenia   . Diabetes mellitus without complication ([place])   . Endometrial cancer ([place])   . Hot flashes   . Hypertension   . Malignant neoplasm ([place])   . Malignant neoplasm of ovary ([place]) 04/30/2013  . Obesity   . Ovarian cancer ([place])   . Pancytopenia ([place])   . Peripheral artery disease ([place])   . Peripheral neuropathy   . Pneumonia 2015  . Port-A-Cath in place   . Seizures ([place]) 09/08/2016   ?? due to low magnesium   Past Surgical History:  Procedure Laterality Date  .  ABDOMINAL HYSTERECTOMY  2014     SOCIAL HISTORY:  Social History   Socioeconomic History  . Marital status: Married    Spouse name: Not on file  . Number of children: Not on file  . Years of education: Not on file  . Highest education level: Not on file  Occupational History  . Not on file  Social Needs  . Financial resource strain: Not on file  . Food insecurity:    Worry: Not on file    Inability: Not on file  . Transportation needs:    Medical: Not on file    Non-medical: Not on file  Tobacco Use  . Smoking status: Never Smoker  . Smokeless tobacco: Never Used  Substance and Sexual Activity  . Alcohol use: No  . Drug use: No  . Sexual activity: Not Currently  Lifestyle  . Physical activity:    Days per week: Not on file    Minutes per session: Not on file  . Stress: Not on file  Relationships  . Social connections:    Talks on phone: Not on file    Gets together: Not on file    Attends religious service: Not on file    Active member of club or organization: Not on file  Attends meetings of clubs or organizations: Not on file    Relationship status: Not on file  . Intimate partner violence:    Fear of current or ex partner: Not on file    Emotionally abused: Not on file    Physically abused: Not on file    Forced sexual activity: Not on file  Other Topics Concern  . Not on file  Social History Narrative  . Not on file    FAMILY HISTORY:  Family History  Problem Relation Age of Onset  . Heart failure Mother   . Stroke Sister   . Cancer Sister   . Diabetes Sister   . Leukemia Sister   . Diabetes Brother     CURRENT MEDICATIONS:  Outpatient Encounter Medications as of 06/09/2018  Medication Sig  . acetaminophen (TYLENOL) 500 MG tablet Take 500 mg by mouth every 6 (six) hours as needed for mild pain or moderate pain.   Marland Kitchen anastrozole (ARIMIDEX) 1 MG tablet Take 1 tablet (1 mg total) by mouth daily.  Marland Kitchen aspirin EC 325 MG tablet Take 325 mg by mouth  daily.   . calcium carbonate (TUMS EX) 750 MG chewable tablet Chew 1 tablet by mouth daily as needed for heartburn.  . calcium carbonate (TUMS SMOOTHIES) 750 MG chewable tablet Chew 1 tablet by mouth daily as needed for heartburn.  . cetirizine (ZYRTEC) 10 MG tablet Take 10 mg by mouth daily.   . Cholecalciferol (VITAMIN D PO) Take 1 tablet by mouth daily.  . cyanocobalamin 1000 MCG tablet Take 1,000 mcg by mouth daily.  . cyclobenzaprine (FLEXERIL) 10 MG tablet Take 10 mg by mouth 3 (three) times daily as needed for muscle spasms.  . furosemide (LASIX) 20 MG tablet Take 1 tablet (20 mg total) by mouth daily.  Marland Kitchen glipiZIDE (GLUCOTROL) 10 MG tablet TAKE 1 TABLET BY MOUTH TWICE A DAY  . levETIRAcetam (KEPPRA) 500 MG tablet Take 1 tablet (500 mg total) by mouth 2 (two) times daily.  Marland Kitchen loperamide (IMODIUM) 2 MG capsule Take 2 mg by mouth as needed for diarrhea or loose stools.   . metFORMIN (GLUCOPHAGE) 1000 MG tablet Take 1,000 mg by mouth 2 (two) times daily with a meal.   . Omega-3 Fatty Acids (FISH OIL) 1000 MG CAPS Take 1,000 mg by mouth daily.   Marland Kitchen oxyCODONE (OXY IR/ROXICODONE) 5 MG immediate release tablet Take 1 tablet (5 mg total) by mouth every 6 (six) hours as needed for severe pain.  Marland Kitchen oxyCODONE-acetaminophen (PERCOCET) 10-325 MG tablet Take 1 tablet by mouth every 4 (four) hours as needed for pain.  . Polyethylene Glycol 3350 (MIRALAX PO) Take 17 g by mouth daily as needed for moderate constipation.   . prochlorperazine (COMPAZINE) 10 MG tablet Take 1 tablet (10 mg total) by mouth every 6 (six) hours as needed for nausea or vomiting.  . simethicone (GAS-X) 80 MG chewable tablet Chew 80 mg by mouth every 6 (six) hours as needed for flatulence.  . [DISCONTINUED] lisinopril (PRINIVIL,ZESTRIL) 40 MG tablet Take 40 mg by mouth daily.   . [DISCONTINUED] magnesium oxide (MAG-OX) 400 MG tablet TAKE 1 TABLET BY MOUTH TWICE A DAY  . [DISCONTINUED] ondansetron (ZOFRAN) 8 MG tablet Take 1 tablet (8 mg  total) by mouth every 8 (eight) hours as needed for nausea or vomiting.  . [DISCONTINUED] ondansetron (ZOFRAN-ODT) 4 MG disintegrating tablet Take 4 mg by mouth every 8 (eight) hours as needed for nausea or vomiting.    No facility-administered encounter  medications on file as of 06/09/2018.     ALLERGIES:  Allergies  Allergen Reactions  . Diphenhydramine Palpitations  . Iodinated Diagnostic Agents Other (See Comments)    Unknown  . Duloxetine Hcl Nausea And Vomiting  . Ibuprofen Other (See Comments)    Makes my bones hurt  . Penicillins Rash    Has patient had a PCN reaction causing immediate rash, facial/tongue/throat swelling, SOB or lightheadedness with hypotension: No Has patient had a PCN reaction causing severe rash involving mucus membranes or skin necrosis: No Has patient had a PCN reaction that required hospitalization: No Has patient had a PCN reaction occurring within the last 10 years: No If all of the above answers are "NO", then may proceed with Cephalosporin use.      PHYSICAL EXAM:  ECOG Performance status: 2.  I have reviewed her vitals.  Blood pressure is 151/66.  Pulse is 88.  Respiratory rate is 18.  Saturations are 98.  Temperature is 98.2. Physical Exam HEENT: Oropharynx has no thrush. Neck: Left supraclavicular adenopathy palpable. Chest: Bilateral clear to auscultation. CVS: S1-S2 regular rate and rhythm. Abdomen: Soft nontender with no mass palpable. Extremities: Edema 2+.   skin: No rashes or ulcers.  LABORATORY DATA:  I have reviewed the labs as listed.  CBC    Component Value Date/Time   WBC 7.8 06/05/2018 1250   RBC 3.63 (L) 06/05/2018 1250   HGB 11.3 (L) 06/05/2018 1250   HCT 36.7 06/05/2018 1250   PLT 204 06/05/2018 1250   MCV 101.1 (H) 06/05/2018 1250   MCH 31.1 06/05/2018 1250   MCHC 30.8 06/05/2018 1250   RDW 13.6 06/05/2018 1250   LYMPHSABS 1.6 06/05/2018 1250   MONOABS 0.5 06/05/2018 1250   EOSABS 0.1 06/05/2018 1250   BASOSABS  0.1 06/05/2018 1250   CMP Latest Ref Rng & Units 06/05/2018 05/02/2018 02/28/2018  Glucose 70 - 99 mg/dL 207(H) 132(H) 201(H)  BUN 8 - 23 mg/dL 28(H) 30(H) 23  Creatinine 0.44 - 1.00 mg/dL 1.45(H) 1.50(H) 1.24(H)  Sodium 135 - 145 mmol/L 139 137 138  Potassium 3.5 - 5.1 mmol/L 4.0 4.1 4.3  Chloride 98 - 111 mmol/L 102 100 102  CO2 22 - 32 mmol/L _0 Calcium 8.9 - 10.3 mg/dL 10.3 11.3(H) 9.1  Total Protein 6.5 - 8.1 g/dL 7.7 7.4 7.2  Total Bilirubin 0.3 - 1.2 mg/dL 0.7 0.5 0.4  Alkaline Phos 38 - 126 U/L 59 62 58  AST 15 - 41 U/L _1 ALT 0 - 44 U/L _2 DIAGNOSTIC IMAGING:  I have independently reviewed the scans and discussed with the patient.      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  Recurrent ovarian cancer: - Status post TAH and BSO in 2014 followed by carboplatin and Taxol for 5 cycles, last cycle DC'd due to thrombocytopenia, followed by EBRT and brachii therapy. -PET imaging and December 2017 demonstrated progression, treated with systemic chemotherapy with carboplatin/gemcitabine from 08/03/2016 through April 2018, poorly tolerated secondary to cytopenias. - Foundation 1 results show MS stable, TMB intermediate, no other actionable mutations, PDL 1 of 0% - In May 2018, left supraclavicular lymph node biopsy consistent with metastatic adenocarcinoma, GYN primary, ER positive - Discussed the results of the PET CT scan dated 01/10/2018 which shows progression of disease, with a hypermetabolic left supraclavicular lymph node, 3 new lung nodules in both lungs, progression of retroperitoneal adenopathy. -  Patient would like to have therapy for her cancer as long as she can tolerate it well.  Because of her severe neuropathy she would not be a candidate for most chemotherapeutic agents in this setting.  We can use single agent carboplatin as she is platinum sensitive.  Since her tumor is slowly growing, I have recommended antiestrogen  therapy with anastrozole.  - She was started on anastrozole on 02/28/2018.  She is tolerating it very well. -We have discontinued lisinopril on 05/02/2018.  She is taking anastrozole continuously without missing doses. - I reviewed the results of the CT scan of the CAP dated 06/05/2018 which shows continued progression of the left supraclavicular lymph node, now measuring 3 cm, compared to 2.2 cm previously. - Bilateral pulmonary nodules are stable in the interval.  No new nodules seen.  Similar appearance of para-aortic retroperitoneal lymphadenopathy.  Stable 3.1 cm calcified mesenteric mass. - We also discussed the results of Ca1 25, which has increased to 592.  This was 332 when anastrozole was started. -Based on these findings, I have recommended change in therapy.  Anastrozole is not working.  We will discontinue it. -We discussed single agent carboplatin AUC 5 every 21 days.  She has received it twice in the past and was sensitive to it.  Last treatment with gemcitabine was discontinued secondary to cytopenias.  We talked about the side effects in detail. - We will plan to start her on carboplatin next week.  I will see her back in 3 weeks after the first cycle.  2.  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 -She is taking Percocet 10 mg every 4 hours.  Sometimes she gets pain within 2 hours after taking Percocet.  She has tried OxyContin 20 mg twice daily in the past but could not tolerate it.  She has been taking half tablet of Percocet in between.  She is running out of the pain medicine early. - She is also taking oxycodone 5 mg as needed in between the Percocet tablets for breakthrough pain.      Orders placed this encounter:  No orders of the defined types were placed in this encounter.     Derek Jack, MD [place] [phone]

## 2018-06-12 ENCOUNTER — Other Ambulatory Visit (HOSPITAL_COMMUNITY): Payer: Self-pay | Admitting: *Deleted

## 2018-06-12 DIAGNOSIS — C569 Malignant neoplasm of unspecified ovary: Secondary | ICD-10-CM

## 2018-06-12 MED ORDER — PROCHLORPERAZINE MALEATE 10 MG PO TABS: 10.0000 mg | ORAL_TABLET | Freq: Four times a day (QID) | ORAL | 1 refills | Status: DC | PRN

## 2018-06-12 MED ORDER — OXYCODONE HCL 5 MG PO TABS: 5.0000 mg | ORAL_TABLET | Freq: Four times a day (QID) | ORAL | 0 refills | Status: DC | PRN

## 2018-06-12 MED ORDER — LIDOCAINE-PRILOCAINE 2.5-2.5 % EX CREA: TOPICAL_CREAM | CUTANEOUS | 3 refills | Status: DC

## 2018-06-12 NOTE — Patient Instructions (Addendum)
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 We are going to be treating you with palliative intent. This means that your cancer is not curable but it is treatable. We are going to be treating you with carboplatin (paraplatin) every 21 days.  This will be given through your port a cath.  You will see the doctor regularly throughout treatment.  We monitor your lab work prior to every treatment. The doctor monitors your response to treatment by the way you are feeling, your blood work, and scans periodically.  There will be wait times while you are here for treatment.  It will take about 30 minutes to 1 hour for your lab work to result.  Then there will be wait times while pharmacy mixes your medications.   You will receive the following premedication prior to each treatment:   Aloxi -  High powered nausea medication given to reduce the risk of chemotherapy induced nausea  Emend - High powered nausea medication given to reduce the risk of chemotherapy induced nausea Dexamethasone - steroid - given to reduce the risk of you having an allergic reaction to the chemotherapy.  Dexamethasone can cause you to feel energized, nervous/anxious/jittery, make you have trouble sleeping, and/or make you feel hot/flushed in the face/neck and/or look pink/red in the face/neck. These side effects will pass as the medicine wears off  Carboplatin (Paraplatin, CBDCA)  About This Drug Carboplatin is used to treat cancer. It is given in the vein (IV).  Possible Side Effects . Bone marrow suppression. This is a decrease in the number of white blood cells, red blood cells, and platelets. This may raise your risk of infection, make you tired and weak (fatigue), and raise your risk of bleeding. . Nausea and vomiting (throwing up) . Weakness . Changes in your liver function . Changes in your kidney function . Electrolyte changes . Pain  Note: Each of the  side effects above was reported in 20% or greater of patients treated with carboplatin. Not all possible side effects are included above.  Warnings and Precautions . Severe bone marrow suppression . Allergic reactions, including anaphylaxis are rare but may happen in some patients. Signs of allergic reaction to this drug may be swelling of the face, feeling like your tongue or throat are swelling, trouble breathing, rash, itching, fever, chills, feeling dizzy, and/or feeling that your heart is beating in a fast or not normal way. If this happens, do not take another dose of this drug. You should get urgent medical treatment. . Severe nausea and vomiting . Effects on the nerves are called peripheral neuropathy. This risk is increased if you are over the age of 73 or if you have received other medicine with risk of peripheral neuropathy. You may feel numbness, tingling, or pain in your hands and feet. It may be hard for you to button your clothes, open jars, or walk as usual. The effect on the nerves may get worse with more doses of the drug. These effects get better in some people after the drug is stopped but it does not get better in all people. Marland Kitchen Blurred vision, loss of vision or other changes in eyesight . Decreased hearing . Skin and tissue irritation including redness, pain, warmth, or swelling at the IV site if the drug leaks out of the vein and into nearby tissue. . Severe changes in your kidney function, which can cause kidney failure . Severe changes in your liver  function, which can cause liver failure  Note: Some of the side effects above are very rare. If you have concerns and/or questions, please discuss them with your medical team.  Important Information . This drug may be present in the saliva, tears, sweat, urine, stool, vomit, semen, and vaginal secretions. Talk to your doctor and/or your nurse about the necessary precautions to take during this time.  Treating Side  Effects . Manage tiredness by pacing your activities for the day. . Be sure to include periods of rest between energy-draining activities. . To decrease the risk of infection, wash your hands regularly. . Avoid close contact with people who have a cold, the flu, or other infections. . Take your temperature as your doctor or nurse tells you, and whenever you feel like you may have a fever. . To help decrease the risk of bleeding, use a soft toothbrush. Check with your nurse before using dental floss. . Be very careful when using knives or tools. . Use an electric shaver instead of a razor. . Drink plenty of fluids (a minimum of eight glasses per day is recommended). . If you throw up or have loose bowel movements, you should drink more fluids so that you do not become dehydrated (lack of water in the body from losing too much fluid). . To help with nausea and vomiting, eat small, frequent meals instead of three large meals a day. Choose foods and drinks that are at room temperature. Ask your nurse or doctor about other helpful tips and medicine that is available to help stop or lessen these symptoms. . If you have numbness and tingling in your hands and feet, be careful when cooking, walking, and handling sharp objects and hot liquids. Marland Kitchen Keeping your pain under control is important to your well-being. Please tell your doctor or nurse if you are experiencing pain.  Food and Drug Interactions . There are no known interactions of carboplatin with food. . This drug may interact with other medicines. Tell your doctor and pharmacist about all the prescription and over-the-counter medicines and dietary supplements (vitamins, minerals, herbs and others) that you are taking at this time. Also, check with your doctor or pharmacist before starting any new prescription or over-the-counter medicines, or dietary supplements to make sure that there are no interactions.  When to Call the Doctor Call  your doctor or nurse if you have any of these symptoms and/or any new or unusual symptoms: . Fever of 100.4 F (38 C) or higher . Chills . Tiredness that interferes with your daily activities . Feeling dizzy or lightheaded . Easy bleeding or bruising . Nausea that stops you from eating or drinking and/or is not relieved by prescribed medicines . Throwing up more than 3 times a day . Blurred vision or other changes in eyesight . Decrease in hearing or ringing in the ear . Signs of allergic reaction: swelling of the face, feeling like your tongue or throat are swelling, trouble breathing, rash, itching, fever, chills, feeling dizzy, and/or feeling that your heart is beating in a fast or not normal way. If this happens, call 911 for emergency care. . While you are getting this drug, please tell your nurse right away if you have any pain, redness, or swelling at the site of the IV infusion . Signs of possible liver problems: dark urine, pale bowel movements, bad stomach pain, feeling very tired and weak, unusual itching, or yellowing of the eyes or skin . Decreased urine, or very dark  urine . Numbness, tingling, or pain in your hands and feet . Pain that does not go away or is not relieved by prescribed medicine . If you think you may be pregnant  Reproduction Warnings . Pregnancy warning: This drug may have harmful effects on the unborn baby. Women of child bearing potential should use effective methods of birth control during your cancer treatment. Let your doctor know right away if you think you may be pregnant. . Breastfeeding warning: It is not known if this drug passes into breast milk. For this reason, women should not breastfeed during treatment because this drug could enter the breast milk and cause harm to a breastfeeding baby. . Fertility warning: Human fertility studies have not been done with this drug. Talk with your doctor or nurse if you plan to have children. Ask for  information on sperm or egg banking.  SELF CARE ACTIVITIES WHILE ON CHEMOTHERAPY:  Hydration Increase your fluid intake 48 hours prior to treatment and drink at least 8 to 12 cups (64 ounces) of water/decaffeinated beverages per day after treatment. You can still have your cup of coffee or soda but these beverages do not count as part of your 8 to 12 cups that you need to drink daily. No alcohol intake.  Medications Continue taking your normal prescription medication as prescribed.  If you start any new herbal or new supplements please let us know first to make sure it is safe.  Mouth Care Have teeth cleaned professionally before starting treatment. Keep dentures and partial plates clean. Use soft toothbrush and do not use mouthwashes that contain alcohol. Biotene is a good mouthwash that is available at most pharmacies or may be ordered by calling [phone]. Use warm salt water gargles (1 teaspoon salt per 1 quart warm water) before and after meals and at bedtime. Or you may rinse with 2 tablespoons of three-percent hydrogen peroxide mixed in eight ounces of water. If you are still having problems with your mouth or sores in your mouth please call the clinic. If you need dental work, please let the doctor know before you go for your appointment so that we can coordinate the best possible time for you in regards to your chemo regimen. You need to also let your dentist know that you are actively taking chemo. We may need to do labs prior to your dental appointment.  Skin Care Always use sunscreen that has not expired and with SPF (Sun Protection Factor) of 50 or higher. Wear hats to protect your head from the sun. Remember to use sunscreen on your hands, ears, face, & feet.  Use good moisturizing lotions such as udder cream, eucerin, or even Vaseline. Some chemotherapies can cause dry skin, color changes in your skin and nails.    . Avoid long, hot showers or baths. . Use gentle, fragrance-free  soaps and laundry detergent. . Use moisturizers, preferably creams or ointments rather than lotions because the thicker consistency is better at preventing skin dehydration. Apply the cream or ointment within 15 minutes of showering. Reapply moisturizer at night, and moisturize your hands every time after you wash them.  Hair Loss (if your doctor says your hair will fall out)  . If your doctor says that your hair is likely to fall out, decide before you begin chemo whether you want to wear a wig. You may want to shop before treatment to match your hair color. . Hats, turbans, and scarves can also camouflage hair loss, although some people  prefer to leave their heads uncovered. If you go bare-headed outdoors, be sure to use sunscreen on your scalp. . Cut your hair short. It eases the inconvenience of shedding lots of hair, but it also can reduce the emotional impact of watching your hair fall out. . Don't perm or color your hair during chemotherapy. Those chemical treatments are already damaging to hair and can enhance hair loss. Once your chemo treatments are done and your hair has grown back, it's OK to resume dyeing or perming hair. With chemotherapy, hair loss is almost always temporary. But when it grows back, it may be a different color or texture. In older adults who still had hair color before chemotherapy, the new growth may be completely gray.  Often, new hair is very fine and soft.  Infection Prevention Please wash your hands for at least 30 seconds using warm soapy water. Handwashing is the #1 way to prevent the spread of germs. Stay away from sick people or people who are getting over a cold. If you develop respiratory systems such as green/yellow mucus production or productive cough or persistent cough let us know and we will see if you need an antibiotic. It is a good idea to keep a pair of gloves on when going into grocery stores/[employer] to decrease your risk of coming into contact with  germs on the carts, etc. Carry alcohol hand gel with you at all times and use it frequently if out in public. If your temperature reaches 100.5 or higher please call the clinic and let us know.  If it is after hours or on the weekend please go to the ER if your temperature is over 100.5.  Please have your own personal thermometer at home to use.    Sex and bodily fluids If you are going to have sex, a condom must be used to protect the person that isn't taking chemotherapy. Chemo can decrease your libido (sex drive). For a few days after chemotherapy, chemotherapy can be excreted through your bodily fluids.  When using the toilet please close the lid and flush the toilet twice.  Do this for a few day after you have had chemotherapy.   Effects of chemotherapy on your sex life Some changes are simple and won't last long. They won't affect your sex life permanently. Sometimes you may feel: . too tired . not strong enough to be very active . sick or sore  . not in the mood . anxious or low Your anxiety might not seem related to sex. For example, you may be worried about the cancer and how your treatment is going. Or you may be worried about money, or about how you family are coping with your illness. These things can cause stress, which can affect your interest in sex. It's important to talk to your partner about how you feel. Remember - the changes to your sex life don't usually last long. There's usually no medical reason to stop having sex during chemo. The drugs won't have any long term physical effects on your performance or enjoyment of sex. Cancer can't be passed on to your partner during sex  Contraception It's important to use reliable contraception during treatment. Avoid getting pregnant while you or your partner are having chemotherapy. This is because the drugs may harm the baby. Sometimes chemotherapy drugs can leave a man or woman infertile.  This means you would not be able to have  children in the future. You might want to talk to  someone about permanent infertility. It can be very difficult to learn that you may no longer be able to have children. Some people find counselling helpful. There might be ways to preserve your fertility, although this is easier for men than for women. You may want to speak to a fertility expert. You can talk about sperm banking or harvesting your eggs. You can also ask about other fertility options, such as donor eggs. If you have or have had breast cancer, your doctor might advise you not to take the contraceptive pill. This is because the hormones in it might affect the cancer.  It is not known for sure whether or not chemotherapy drugs can be passed on through semen or secretions from the vagina. Because of this some doctors advise people to use a barrier method if you have sex during treatment. This applies to vaginal, anal or oral sex. Generally, doctors advise a barrier method only for the time you are actually having the treatment and for about a week after your treatment. Advice like this can be worrying, but this does not mean that you have to avoid being intimate with your partner. You can still have close contact with your partner and continue to enjoy sex.  Animals If you have cats or birds we just ask that you not change the litter or change the cage.  Please have someone else do this for you while you are on chemotherapy.   Food Safety During and After Cancer Treatment Food safety is important for people both during and after cancer treatment. Cancer and cancer treatments, such as chemotherapy, radiation therapy, and stem cell/bone marrow transplantation, often weaken the immune system. This makes it harder for your body to protect itself from foodborne illness, also called food poisoning. Foodborne illness is caused by eating food that contains harmful bacteria, parasites, or viruses.  Foods to avoid Some foods have a higher risk of  becoming tainted with bacteria. These include: Marland Kitchen Unwashed fresh fruit and vegetables, especially leafy vegetables that can hide dirt and other contaminants . Raw sprouts, such as alfalfa sprouts . Raw or undercooked beef, especially ground beef, or other raw or undercooked meat and poultry . Fatty, fried, or spicy foods immediately before or after treatment.  These can sit heavy on your stomach and make you feel nauseous. . Raw or undercooked shellfish, such as oysters. . Sushi and sashimi, which often contain raw fish.  . Unpasteurized beverages, such as unpasteurized fruit juices, raw milk, raw yogurt, or cider . Undercooked eggs, such as soft boiled, over easy, and poached; raw, unpasteurized eggs; or foods made with raw egg, such as homemade raw cookie dough and homemade mayonnaise Simple steps for food safety Shop smart. . Do not buy food stored or displayed in an unclean area. . Do not buy bruised or damaged fruits or vegetables. . Do not buy cans that have cracks, dents, or bulges. . Pick up foods that can spoil at the end of your shopping trip and store them in a cooler on the way home. Prepare and clean up foods carefully. . Rinse all fresh fruits and vegetables under running water, and dry them with a clean towel or paper towel. . Clean the top of cans before opening them. . After preparing food, wash your hands for 20 seconds with hot water and soap. Pay special attention to areas between fingers and under nails. . Clean your utensils and dishes with hot water and soap. Marland Kitchen Disinfect your kitchen and  cutting boards using 1 teaspoon of liquid, unscented bleach mixed into 1 quart of water.   Dispose of old food. . Eat canned and packaged food before its expiration date (the "use by" or "best before" date). . Consume refrigerated leftovers within 3 to 4 days. After that time, throw out the food. Even if the food does not smell or look spoiled, it still may be unsafe. Some bacteria, such  as Listeria, can grow even on foods stored in the refrigerator if they are kept for too long. Take precautions when eating out. . At restaurants, avoid buffets and salad bars where food sits out for a long time and comes in contact with many people. Food can become contaminated when someone with a virus, often a norovirus, or another "bug" handles it. . Put any leftover food in a "to-go" container yourself, rather than having the server do it. And, refrigerate leftovers as soon as you get home. . Choose restaurants that are clean and that are willing to prepare your food as you order it cooked.   MEDICATIONS:                                                                                                                                                                Compazine/Prochlorperazine 10mg  tablet. Take 1 tablet every 6 hours as needed for nausea/vomiting. (This can make you sleepy)   EMLA cream. Apply a quarter size amount to port site 1 hour prior to chemo. Do not rub in. Cover with plastic wrap.   Over-the-Counter Meds:  Colace - 100 mg capsules - take 2 capsules daily.  If this doesn't help then you can increase to 2 capsules twice daily.  Call us if this does not help your bowels move.   Imodium 2mg  capsule. Take 2 capsules after the 1st loose stool and then 1 capsule every 2 hours until you go a total of 12 hours without having a loose stool. Call the [facility] if loose stools continue. If diarrhea occurs at bedtime, take 2 capsules at bedtime. Then take 2 capsules every 4 hours until morning. Call [facility].    Diarrhea Sheet   If you are having loose stools/diarrhea, please purchase Imodium and begin taking as outlined:  At the first sign of poorly formed or loose stools you should begin taking Imodium (loperamide) 2 mg capsules.  Take two caplets (4mg ) followed by one caplet (2mg ) every 2 hours until you have had no diarrhea for 12 hours.  During the night take two  caplets (4mg ) at bedtime and continue every 4 hours during the night until the morning.  Stop taking Imodium only after there is no sign of diarrhea for 12 hours.    Always call the [place] if you are  having loose stools/diarrhea that you can't get under control.  Loose stools/diarrhea leads to dehydration (loss of water) in your body.  We have other options of trying to get the loose stools/diarrhea to stop but you must let us know!   Constipation Sheet  Colace - 100 mg capsules - take 2 capsules daily.  If this doesn't help then you can increase to 2 capsules twice daily.  Please call if the above does not work for you.   Do not go more than 2 days without a bowel movement.  It is very important that you do not become constipated.  It will make you feel sick to your stomach (nausea) and can cause abdominal pain and vomiting.   Nausea Sheet   Compazine/Prochlorperazine 10mg  tablet. Take 1 tablet every 6 hours as needed for nausea/vomiting. (This can make you sleepy)  If you are having persistent nausea (nausea that does not stop) please call the [facility] and let us know the amount of nausea that you are experiencing.  If you begin to vomit, you need to call the [facility] and if it is the weekend and you have vomited more than one time and can't get it to stop-go to the Emergency Room.  Persistent nausea/vomiting can lead to dehydration (loss of fluid in your body) and will make you feel terrible.   Ice chips, sips of clear liquids, foods that are @ room temperature, crackers, and toast tend to be better tolerated.   SYMPTOMS TO REPORT AS SOON AS POSSIBLE AFTER TREATMENT:   FEVER GREATER THAN 100.5 F  CHILLS WITH OR WITHOUT FEVER  NAUSEA AND VOMITING THAT IS NOT CONTROLLED WITH YOUR NAUSEA MEDICATION  UNUSUAL SHORTNESS OF BREATH  UNUSUAL BRUISING OR BLEEDING  TENDERNESS IN MOUTH AND THROAT WITH OR WITHOUT PRESENCE OF ULCERS  URINARY PROBLEMS  BOWEL  PROBLEMS  UNUSUAL RASH      Wear comfortable clothing and clothing appropriate for easy access to any Portacath or PICC line. Let us know if there is anything that we can do to make your therapy better!    What to do if you need assistance after hours or on the weekends: CALL [phone].  HOLD on the line, do not hang up.  You will hear multiple messages but at the end you will be connected with a nurse triage line.  They will contact the doctor if necessary.  Most of the time they will be able to assist you.  Do not call the hospital operator.      I have been informed and understand all of the instructions given to me and have received a copy. I have been instructed to call the clinic [phone] or my family physician as soon as possible for continued medical care, if indicated. I do not have any more questions at this time but understand that I may call the [facility] or the Patient Navigator at [phone] during office hours should I have questions or need assistance in obtaining follow-up care.

## 2018-06-12 NOTE — Progress Notes (Signed)
Chemotherapy teaching pulled together. 

## 2018-06-16 ENCOUNTER — Inpatient Hospital Stay (HOSPITAL_COMMUNITY): Payer: Medicare HMO

## 2018-06-16 ENCOUNTER — Other Ambulatory Visit (HOSPITAL_COMMUNITY): Payer: Medicare HMO

## 2018-06-16 ENCOUNTER — Ambulatory Visit (HOSPITAL_COMMUNITY): Payer: Medicare HMO

## 2018-06-16 DIAGNOSIS — C569 Malignant neoplasm of unspecified ovary: Secondary | ICD-10-CM

## 2018-06-22 ENCOUNTER — Inpatient Hospital Stay (HOSPITAL_COMMUNITY): Payer: Medicare HMO

## 2018-06-22 VITALS — BP 141/55 | HR 79 | Temp 98.6°F | Resp 18

## 2018-06-22 DIAGNOSIS — Z9221 Personal history of antineoplastic chemotherapy: Secondary | ICD-10-CM | POA: Diagnosis not present

## 2018-06-22 DIAGNOSIS — Z79899 Other long term (current) drug therapy: Secondary | ICD-10-CM | POA: Diagnosis not present

## 2018-06-22 DIAGNOSIS — C778 Secondary and unspecified malignant neoplasm of lymph nodes of multiple regions: Secondary | ICD-10-CM | POA: Diagnosis not present

## 2018-06-22 DIAGNOSIS — C7802 Secondary malignant neoplasm of left lung: Secondary | ICD-10-CM | POA: Diagnosis not present

## 2018-06-22 DIAGNOSIS — T451X5A Adverse effect of antineoplastic and immunosuppressive drugs, initial encounter: Secondary | ICD-10-CM | POA: Diagnosis not present

## 2018-06-22 DIAGNOSIS — Z5111 Encounter for antineoplastic chemotherapy: Secondary | ICD-10-CM | POA: Diagnosis not present

## 2018-06-22 DIAGNOSIS — G620 Drug-induced polyneuropathy: Secondary | ICD-10-CM | POA: Diagnosis not present

## 2018-06-22 DIAGNOSIS — C569 Malignant neoplasm of unspecified ovary: Secondary | ICD-10-CM | POA: Diagnosis not present

## 2018-06-22 DIAGNOSIS — C7801 Secondary malignant neoplasm of right lung: Secondary | ICD-10-CM | POA: Diagnosis not present

## 2018-06-22 DIAGNOSIS — Z90710 Acquired absence of both cervix and uterus: Secondary | ICD-10-CM | POA: Diagnosis not present

## 2018-06-22 LAB — CBC WITH DIFFERENTIAL/PLATELET
Abs Immature Granulocytes: 0.02 10*3/uL (ref 0.00–0.07)
Basophils Absolute: 0 10*3/uL (ref 0.0–0.1)
Basophils Relative: 0 %
Eosinophils Absolute: 0.1 10*3/uL (ref 0.0–0.5)
Eosinophils Relative: 1 %
HCT: 35.6 % — ABNORMAL LOW (ref 36.0–46.0)
Hemoglobin: 10.9 g/dL — ABNORMAL LOW (ref 12.0–15.0)
Immature Granulocytes: 0 %
Lymphocytes Relative: 20 %
Lymphs Abs: 1.8 10*3/uL (ref 0.7–4.0)
MCH: 31.1 pg (ref 26.0–34.0)
MCHC: 30.6 g/dL (ref 30.0–36.0)
MCV: 101.7 fL — ABNORMAL HIGH (ref 80.0–100.0)
MONO ABS: 0.6 10*3/uL (ref 0.1–1.0)
Monocytes Relative: 7 %
Neutro Abs: 6.5 10*3/uL (ref 1.7–7.7)
Neutrophils Relative %: 72 %
Platelets: 189 10*3/uL (ref 150–400)
RBC: 3.5 MIL/uL — ABNORMAL LOW (ref 3.87–5.11)
RDW: 13.9 % (ref 11.5–15.5)
WBC: 9 10*3/uL (ref 4.0–10.5)
nRBC: 0 % (ref 0.0–0.2)

## 2018-06-22 LAB — COMPREHENSIVE METABOLIC PANEL
ALT: 15 U/L (ref 0–44)
AST: 19 U/L (ref 15–41)
Albumin: 3.3 g/dL — ABNORMAL LOW (ref 3.5–5.0)
Alkaline Phosphatase: 62 U/L (ref 38–126)
Anion gap: 14 (ref 5–15)
BUN: 23 mg/dL (ref 8–23)
CO2: 25 mmol/L (ref 22–32)
Calcium: 10.5 mg/dL — ABNORMAL HIGH (ref 8.9–10.3)
Chloride: 98 mmol/L (ref 98–111)
Creatinine, Ser: 1.2 mg/dL — ABNORMAL HIGH (ref 0.44–1.00)
GFR calc non Af Amer: 46 mL/min — ABNORMAL LOW (ref >60)
GFR, EST AFRICAN AMERICAN: 54 mL/min — AB (ref >60)
Glucose, Bld: 247 mg/dL — ABNORMAL HIGH (ref 70–99)
Potassium: 4.3 mmol/L (ref 3.5–5.1)
Sodium: 137 mmol/L (ref 135–145)
Total Bilirubin: 0.5 mg/dL (ref 0.3–1.2)
Total Protein: 7.4 g/dL (ref 6.5–8.1)

## 2018-06-22 MED ORDER — PALONOSETRON HCL INJECTION 0.25 MG/5ML
INTRAVENOUS | Status: AC
  Filled 2018-06-22: qty 5

## 2018-06-22 MED ORDER — SODIUM CHLORIDE 0.9 % IV SOLN
Freq: Once | INTRAVENOUS | Status: AC
  Administered 2018-06-22: 10:00:00 via INTRAVENOUS

## 2018-06-22 MED ORDER — PALONOSETRON HCL INJECTION 0.25 MG/5ML
0.2500 mg | Freq: Once | INTRAVENOUS | Status: AC
  Administered 2018-06-22: 0.25 mg via INTRAVENOUS

## 2018-06-22 MED ORDER — SODIUM CHLORIDE 0.9% FLUSH
10.0000 mL | INTRAVENOUS | Status: DC | PRN
  Administered 2018-06-22: 10 mL
  Filled 2018-06-22: qty 10

## 2018-06-22 MED ORDER — SODIUM CHLORIDE 0.9 % IV SOLN
468.5000 mg | Freq: Once | INTRAVENOUS | Status: AC
  Administered 2018-06-22: 470 mg via INTRAVENOUS
  Filled 2018-06-22: qty 47

## 2018-06-22 MED ORDER — HEPARIN SOD (PORK) LOCK FLUSH 100 UNIT/ML IV SOLN
500.0000 [IU] | Freq: Once | INTRAVENOUS | Status: AC | PRN
  Administered 2018-06-22: 500 [IU]

## 2018-06-22 MED ORDER — SODIUM CHLORIDE 0.9 % IV SOLN
Freq: Once | INTRAVENOUS | Status: AC
  Administered 2018-06-22: 11:00:00 via INTRAVENOUS
  Filled 2018-06-22: qty 5

## 2018-06-22 NOTE — Progress Notes (Signed)
[facility] reviewed with Dr. Delton Coombes and patient approved for 1st cycle Carboplatin today. Chemo education packet given to patient and education provided written and verbally. Consent obtained. Pt verbalizes understanding and states she has no questions at this time.  Claudette Head tolerated Carboplatin without incident or complaint. VSS upon completion of treatment. Discharged via wheelchair in satisfactory condition in presence of husband.

## 2018-06-22 NOTE — Patient Instructions (Signed)
[facility] Discharge Instructions for Patients Receiving Chemotherapy   Beginning January 23rd 2017 lab work for the [facility] will be done in the  Main lab at [facility] on 1st floor. If you have a lab appointment with the [facility] please come in thru the  Main Entrance and check in at the main information desk   Today you received the following chemotherapy agents Carboplatin. You will follow up with labs and to see the doctor before your next treatment in 3 weeks.  To help prevent nausea and vomiting after your treatment, we encourage you to take your nausea medication    If you develop nausea and vomiting, or diarrhea that is not controlled by your medication, call the clinic.  The clinic phone number is [phone]. Office hours are Monday-Friday 8:30am-5:00pm.  BELOW ARE SYMPTOMS THAT SHOULD BE REPORTED IMMEDIATELY:  *FEVER GREATER THAN 101.0 F  *CHILLS WITH OR WITHOUT FEVER  NAUSEA AND VOMITING THAT IS NOT CONTROLLED WITH YOUR NAUSEA MEDICATION  *UNUSUAL SHORTNESS OF BREATH  *UNUSUAL BRUISING OR BLEEDING  TENDERNESS IN MOUTH AND THROAT WITH OR WITHOUT PRESENCE OF ULCERS  *URINARY PROBLEMS  *BOWEL PROBLEMS  UNUSUAL RASH Items with * indicate a potential emergency and should be followed up as soon as possible. If you have an emergency after office hours please contact your primary care physician or go to the nearest emergency department.  Please call the clinic during office hours if you have any questions or concerns.   You may also contact the Patient Navigator at [phone] should you have any questions or need assistance in obtaining follow up care.      Resources For Cancer Patients and their Caregivers ? American Cancer Society: Can assist with transportation, wigs, general needs, runs Look Good Feel Better.        ([phone] 690-4641 ? Cancer Care: Provides financial assistance, online support groups, medication/co-pay  assistance.  1-800-813-HOPE 573-547-6043) ? [facility] Assists [place] cancer patients and their families through emotional , educational and financial support.  365-736-2888 ? [place] DSS Where to apply for food stamps, Medicaid and utility assistance. 832-629-7092 ? [employer]: Transportation to medical appointments. (607) 271-9735 ? Social Security Administration: May apply for disability if have a Stage IV cancer. 858-327-3956 680-184-5617 ? [employer], Disability and Transit Services: Assists with nutrition, care and transit needs. 639-246-6975

## 2018-06-23 ENCOUNTER — Other Ambulatory Visit (HOSPITAL_COMMUNITY): Payer: Self-pay | Admitting: *Deleted

## 2018-06-23 ENCOUNTER — Telehealth (HOSPITAL_COMMUNITY): Payer: Self-pay

## 2018-06-23 DIAGNOSIS — C569 Malignant neoplasm of unspecified ovary: Secondary | ICD-10-CM

## 2018-06-23 MED ORDER — OXYCODONE-ACETAMINOPHEN 10-325 MG PO TABS: 1.0000 | ORAL_TABLET | ORAL | 0 refills | Status: DC | PRN

## 2018-06-23 NOTE — Telephone Encounter (Signed)
24 hour follow up-patient had a rough night, but is feeling better today. No complaints at this time

## 2018-06-26 ENCOUNTER — Other Ambulatory Visit (HOSPITAL_COMMUNITY): Payer: Self-pay | Admitting: *Deleted

## 2018-06-30 ENCOUNTER — Other Ambulatory Visit (HOSPITAL_COMMUNITY): Payer: Self-pay | Admitting: *Deleted

## 2018-06-30 DIAGNOSIS — C569 Malignant neoplasm of unspecified ovary: Secondary | ICD-10-CM

## 2018-06-30 MED ORDER — OXYCODONE HCL 5 MG PO TABS: 5.0000 mg | ORAL_TABLET | Freq: Four times a day (QID) | ORAL | 0 refills | Status: DC | PRN

## 2018-07-01 ENCOUNTER — Other Ambulatory Visit (HOSPITAL_COMMUNITY): Payer: Self-pay | Admitting: Hematology

## 2018-07-01 DIAGNOSIS — C569 Malignant neoplasm of unspecified ovary: Secondary | ICD-10-CM

## 2018-07-07 ENCOUNTER — Ambulatory Visit (HOSPITAL_COMMUNITY): Payer: Medicare HMO | Admitting: Hematology

## 2018-07-07 ENCOUNTER — Other Ambulatory Visit (HOSPITAL_COMMUNITY): Payer: Self-pay | Admitting: *Deleted

## 2018-07-07 ENCOUNTER — Ambulatory Visit (HOSPITAL_COMMUNITY): Payer: Medicare HMO

## 2018-07-07 ENCOUNTER — Other Ambulatory Visit (HOSPITAL_COMMUNITY): Payer: Medicare HMO

## 2018-07-11 ENCOUNTER — Observation Stay (HOSPITAL_BASED_OUTPATIENT_CLINIC_OR_DEPARTMENT_OTHER): Payer: Medicare HMO

## 2018-07-11 ENCOUNTER — Other Ambulatory Visit (HOSPITAL_COMMUNITY): Payer: Self-pay | Admitting: *Deleted

## 2018-07-11 ENCOUNTER — Other Ambulatory Visit: Payer: Self-pay

## 2018-07-11 ENCOUNTER — Encounter (HOSPITAL_COMMUNITY): Payer: Self-pay | Admitting: Emergency Medicine

## 2018-07-11 ENCOUNTER — Other Ambulatory Visit (HOSPITAL_COMMUNITY): Payer: Self-pay | Admitting: Nurse Practitioner

## 2018-07-11 ENCOUNTER — Inpatient Hospital Stay (HOSPITAL_COMMUNITY)
Admission: EM | Admit: 2018-07-11 | Discharge: 2018-07-13 | DRG: 808 | Disposition: A | Discharge status: Home / Self Care | Payer: Medicare HMO | Attending: Internal Medicine | Admitting: Internal Medicine

## 2018-07-11 ENCOUNTER — Emergency Department (HOSPITAL_COMMUNITY): Payer: Medicare HMO

## 2018-07-11 DIAGNOSIS — Z888 Allergy status to other drugs, medicaments and biological substances status: Secondary | ICD-10-CM

## 2018-07-11 DIAGNOSIS — Z8542 Personal history of malignant neoplasm of other parts of uterus: Secondary | ICD-10-CM | POA: Diagnosis not present

## 2018-07-11 DIAGNOSIS — D61810 Antineoplastic chemotherapy induced pancytopenia: Principal | ICD-10-CM

## 2018-07-11 DIAGNOSIS — R079 Chest pain, unspecified: Secondary | ICD-10-CM | POA: Diagnosis not present

## 2018-07-11 DIAGNOSIS — D61818 Other pancytopenia: Secondary | ICD-10-CM | POA: Diagnosis not present

## 2018-07-11 DIAGNOSIS — C569 Malignant neoplasm of unspecified ovary: Secondary | ICD-10-CM | POA: Diagnosis not present

## 2018-07-11 DIAGNOSIS — E119 Type 2 diabetes mellitus without complications: Secondary | ICD-10-CM

## 2018-07-11 DIAGNOSIS — J45909 Unspecified asthma, uncomplicated: Secondary | ICD-10-CM | POA: Diagnosis present

## 2018-07-11 DIAGNOSIS — Z880 Allergy status to penicillin: Secondary | ICD-10-CM

## 2018-07-11 DIAGNOSIS — G40901 Epilepsy, unspecified, not intractable, with status epilepticus: Secondary | ICD-10-CM

## 2018-07-11 DIAGNOSIS — T451X5A Adverse effect of antineoplastic and immunosuppressive drugs, initial encounter: Secondary | ICD-10-CM | POA: Diagnosis present

## 2018-07-11 DIAGNOSIS — Z7984 Long term (current) use of oral hypoglycemic drugs: Secondary | ICD-10-CM

## 2018-07-11 DIAGNOSIS — G9341 Metabolic encephalopathy: Secondary | ICD-10-CM

## 2018-07-11 DIAGNOSIS — C7982 Secondary malignant neoplasm of genital organs: Secondary | ICD-10-CM | POA: Diagnosis present

## 2018-07-11 DIAGNOSIS — R0602 Shortness of breath: Secondary | ICD-10-CM | POA: Diagnosis not present

## 2018-07-11 DIAGNOSIS — D696 Thrombocytopenia, unspecified: Secondary | ICD-10-CM | POA: Diagnosis not present

## 2018-07-11 DIAGNOSIS — E876 Hypokalemia: Secondary | ICD-10-CM

## 2018-07-11 DIAGNOSIS — I10 Essential (primary) hypertension: Secondary | ICD-10-CM | POA: Diagnosis not present

## 2018-07-11 DIAGNOSIS — D6959 Other secondary thrombocytopenia: Secondary | ICD-10-CM | POA: Diagnosis not present

## 2018-07-11 DIAGNOSIS — E1151 Type 2 diabetes mellitus with diabetic peripheral angiopathy without gangrene: Secondary | ICD-10-CM | POA: Diagnosis present

## 2018-07-11 DIAGNOSIS — E1142 Type 2 diabetes mellitus with diabetic polyneuropathy: Secondary | ICD-10-CM | POA: Diagnosis present

## 2018-07-11 DIAGNOSIS — Z7982 Long term (current) use of aspirin: Secondary | ICD-10-CM

## 2018-07-11 DIAGNOSIS — R7989 Other specified abnormal findings of blood chemistry: Secondary | ICD-10-CM

## 2018-07-11 DIAGNOSIS — D649 Anemia, unspecified: Secondary | ICD-10-CM

## 2018-07-11 DIAGNOSIS — E669 Obesity, unspecified: Secondary | ICD-10-CM | POA: Diagnosis present

## 2018-07-11 DIAGNOSIS — I493 Ventricular premature depolarization: Secondary | ICD-10-CM | POA: Diagnosis present

## 2018-07-11 DIAGNOSIS — Z6831 Body mass index (BMI) 31.0-31.9, adult: Secondary | ICD-10-CM

## 2018-07-11 DIAGNOSIS — J9811 Atelectasis: Secondary | ICD-10-CM | POA: Diagnosis present

## 2018-07-11 DIAGNOSIS — R072 Precordial pain: Secondary | ICD-10-CM | POA: Diagnosis not present

## 2018-07-11 DIAGNOSIS — C7800 Secondary malignant neoplasm of unspecified lung: Secondary | ICD-10-CM | POA: Diagnosis present

## 2018-07-11 DIAGNOSIS — Z79899 Other long term (current) drug therapy: Secondary | ICD-10-CM

## 2018-07-11 LAB — ECHOCARDIOGRAM COMPLETE
Height: 69 in
Weight: 3392 oz

## 2018-07-11 LAB — BASIC METABOLIC PANEL
ANION GAP: 7 (ref 5–15)
Anion gap: 9 (ref 5–15)
BUN: 34 mg/dL — ABNORMAL HIGH (ref 8–23)
BUN: 33 mg/dL — ABNORMAL HIGH (ref 8–23)
CO2: 27 mmol/L (ref 22–32)
CO2: 26 mmol/L (ref 22–32)
CREATININE: 1.35 mg/dL — AB (ref 0.44–1.00)
Calcium: 12.1 mg/dL — ABNORMAL HIGH (ref 8.9–10.3)
Calcium: 11.9 mg/dL — ABNORMAL HIGH (ref 8.9–10.3)
Chloride: 101 mmol/L (ref 98–111)
Chloride: 103 mmol/L (ref 98–111)
Creatinine, Ser: 1.5 mg/dL — ABNORMAL HIGH (ref 0.44–1.00)
GFR calc Af Amer: 41 mL/min — ABNORMAL LOW (ref >60)
GFR calc Af Amer: 47 mL/min — ABNORMAL LOW (ref >60)
GFR calc non Af Amer: 35 mL/min — ABNORMAL LOW (ref >60)
GFR calc non Af Amer: 40 mL/min — ABNORMAL LOW (ref >60)
Glucose, Bld: 286 mg/dL — ABNORMAL HIGH (ref 70–99)
Glucose, Bld: 225 mg/dL — ABNORMAL HIGH (ref 70–99)
Potassium: 4 mmol/L (ref 3.5–5.1)
Potassium: 3.8 mmol/L (ref 3.5–5.1)
Sodium: 135 mmol/L (ref 135–145)
Sodium: 138 mmol/L (ref 135–145)

## 2018-07-11 LAB — CBC WITH DIFFERENTIAL/PLATELET
ABS IMMATURE GRANULOCYTES: 0.01 10*3/uL (ref 0.00–0.07)
Abs Immature Granulocytes: 0 10*3/uL (ref 0.00–0.07)
Basophils Absolute: 0 10*3/uL (ref 0.0–0.1)
Basophils Absolute: 0 10*3/uL (ref 0.0–0.1)
Basophils Relative: 0 %
Basophils Relative: 0 %
Eosinophils Absolute: 0 10*3/uL (ref 0.0–0.5)
Eosinophils Absolute: 0 10*3/uL (ref 0.0–0.5)
Eosinophils Relative: 0 %
Eosinophils Relative: 0 %
HCT: 23.6 % — ABNORMAL LOW (ref 36.0–46.0)
HCT: 22.4 % — ABNORMAL LOW (ref 36.0–46.0)
Hemoglobin: 7.4 g/dL — ABNORMAL LOW (ref 12.0–15.0)
Hemoglobin: 7.1 g/dL — ABNORMAL LOW (ref 12.0–15.0)
IMMATURE GRANULOCYTES: 0 %
Immature Granulocytes: 0 %
LYMPHS ABS: 0.9 10*3/uL (ref 0.7–4.0)
Lymphocytes Relative: 31 %
Lymphocytes Relative: 31 %
Lymphs Abs: 0.7 10*3/uL (ref 0.7–4.0)
MCH: 30.6 pg (ref 26.0–34.0)
MCH: 31.3 pg (ref 26.0–34.0)
MCHC: 31.4 g/dL (ref 30.0–36.0)
MCHC: 31.7 g/dL (ref 30.0–36.0)
MCV: 97.5 fL (ref 80.0–100.0)
MCV: 98.7 fL (ref 80.0–100.0)
Monocytes Absolute: 0.1 10*3/uL (ref 0.1–1.0)
Monocytes Absolute: 0.2 10*3/uL (ref 0.1–1.0)
Monocytes Relative: 6 %
Monocytes Relative: 8 %
NEUTROS ABS: 1.8 10*3/uL (ref 1.7–7.7)
Neutro Abs: 1.4 10*3/uL — ABNORMAL LOW (ref 1.7–7.7)
Neutrophils Relative %: 63 %
Neutrophils Relative %: 61 %
Platelets: 4 10*3/uL — CL (ref 150–400)
Platelets: 71 10*3/uL — ABNORMAL LOW (ref 150–400)
RBC: 2.42 MIL/uL — ABNORMAL LOW (ref 3.87–5.11)
RBC: 2.27 MIL/uL — ABNORMAL LOW (ref 3.87–5.11)
RDW: 12.5 % (ref 11.5–15.5)
RDW: 12.6 % (ref 11.5–15.5)
WBC: 2.3 10*3/uL — ABNORMAL LOW (ref 4.0–10.5)
WBC: 2.9 10*3/uL — ABNORMAL LOW (ref 4.0–10.5)
nRBC: 0 % (ref 0.0–0.2)
nRBC: 0 % (ref 0.0–0.2)

## 2018-07-11 LAB — PROTIME-INR
INR: 0.96
Prothrombin Time: 12.7 seconds (ref 11.4–15.2)

## 2018-07-11 LAB — HEPATIC FUNCTION PANEL
ALT: 16 U/L (ref 0–44)
AST: 21 U/L (ref 15–41)
Albumin: 3 g/dL — ABNORMAL LOW (ref 3.5–5.0)
Alkaline Phosphatase: 54 U/L (ref 38–126)
BILIRUBIN TOTAL: 0.6 mg/dL (ref 0.3–1.2)
Bilirubin, Direct: 0.1 mg/dL (ref 0.0–0.2)
Indirect Bilirubin: 0.5 mg/dL (ref 0.3–0.9)
Total Protein: 6.5 g/dL (ref 6.5–8.1)

## 2018-07-11 LAB — APTT: aPTT: 22 seconds — ABNORMAL LOW (ref 24–36)

## 2018-07-11 LAB — GLUCOSE, CAPILLARY
Glucose-Capillary: 197 mg/dL — ABNORMAL HIGH (ref 70–99)
Glucose-Capillary: 141 mg/dL — ABNORMAL HIGH (ref 70–99)

## 2018-07-11 LAB — PHOSPHORUS: Phosphorus: 2.6 mg/dL (ref 2.5–4.6)

## 2018-07-11 LAB — PREPARE RBC (CROSSMATCH)

## 2018-07-11 LAB — FIBRINOGEN: Fibrinogen: 580 mg/dL — ABNORMAL HIGH (ref 210–475)

## 2018-07-11 LAB — TROPONIN I
TROPONIN I: 0.27 ng/mL — AB (ref <0.03)
Troponin I: 0.45 ng/mL (ref <0.03)

## 2018-07-11 LAB — MAGNESIUM: Magnesium: 1.3 mg/dL — ABNORMAL LOW (ref 1.7–2.4)

## 2018-07-11 LAB — I-STAT TROPONIN, ED: Troponin i, poc: 0.04 ng/mL (ref 0.00–0.08)

## 2018-07-11 LAB — D-DIMER, QUANTITATIVE: D-Dimer, Quant: 1.5 ug/mL-FEU — ABNORMAL HIGH (ref 0.00–0.50)

## 2018-07-11 MED ORDER — SODIUM CHLORIDE 0.9% IV SOLUTION: Freq: Once | INTRAVENOUS | Status: DC

## 2018-07-11 MED ORDER — HYDROMORPHONE HCL 1 MG/ML IJ SOLN: 1.0000 mg | INTRAMUSCULAR | Status: DC | PRN

## 2018-07-11 MED ORDER — SODIUM CHLORIDE 0.9% IV SOLUTION
Freq: Once | INTRAVENOUS | Status: AC
  Administered 2018-07-11: 07:00:00 via INTRAVENOUS

## 2018-07-11 MED ORDER — LORAZEPAM 2 MG/ML IJ SOLN
1.0000 mg | INTRAMUSCULAR | Status: DC | PRN
  Administered 2018-07-11 – 2018-07-12 (×2): 1 mg via INTRAVENOUS
  Filled 2018-07-11 (×2): qty 1

## 2018-07-11 MED ORDER — FAMOTIDINE IN NACL 20-0.9 MG/50ML-% IV SOLN: 20.0000 mg | Freq: Once | INTRAVENOUS | Status: DC

## 2018-07-11 MED ORDER — HYOSCYAMINE SULFATE 0.125 MG SL SUBL
0.2500 mg | SUBLINGUAL_TABLET | Freq: Once | SUBLINGUAL | Status: DC
  Filled 2018-07-11: qty 2

## 2018-07-11 MED ORDER — ENSURE ENLIVE PO LIQD
237.0000 mL | Freq: Two times a day (BID) | ORAL | Status: DC
  Administered 2018-07-12: 237 mL via ORAL

## 2018-07-11 MED ORDER — MAGNESIUM SULFATE 2 GM/50ML IV SOLN
2.0000 g | INTRAVENOUS | Status: AC
  Administered 2018-07-11: 2 g via INTRAVENOUS
  Filled 2018-07-11: qty 50

## 2018-07-11 MED ORDER — ACETAMINOPHEN 650 MG RE SUPP: 650.0000 mg | Freq: Four times a day (QID) | RECTAL | Status: DC | PRN

## 2018-07-11 MED ORDER — FAMOTIDINE 20 MG PO TABS
40.0000 mg | ORAL_TABLET | Freq: Once | ORAL | Status: AC
  Administered 2018-07-11: 40 mg via ORAL
  Filled 2018-07-11: qty 2

## 2018-07-11 MED ORDER — ALUM & MAG HYDROXIDE-SIMETH 200-200-20 MG/5ML PO SUSP
30.0000 mL | Freq: Once | ORAL | Status: DC
  Filled 2018-07-11: qty 30

## 2018-07-11 MED ORDER — LIDOCAINE VISCOUS HCL 2 % MT SOLN
15.0000 mL | Freq: Once | OROMUCOSAL | Status: DC
  Filled 2018-07-11: qty 15

## 2018-07-11 MED ORDER — METOPROLOL TARTRATE 25 MG PO TABS
12.5000 mg | ORAL_TABLET | Freq: Two times a day (BID) | ORAL | Status: DC
  Administered 2018-07-11 – 2018-07-13 (×5): 12.5 mg via ORAL
  Filled 2018-07-11 (×5): qty 1

## 2018-07-11 MED ORDER — OXYCODONE HCL 5 MG PO TABS
5.0000 mg | ORAL_TABLET | Freq: Four times a day (QID) | ORAL | Status: DC | PRN
  Administered 2018-07-11 – 2018-07-13 (×5): 5 mg via ORAL
  Filled 2018-07-11 (×5): qty 1

## 2018-07-11 MED ORDER — INSULIN ASPART 100 UNIT/ML ~~LOC~~ SOLN
0.0000 [IU] | Freq: Three times a day (TID) | SUBCUTANEOUS | Status: DC
  Administered 2018-07-11: 3 [IU] via SUBCUTANEOUS
  Administered 2018-07-12 – 2018-07-13 (×5): 2 [IU] via SUBCUTANEOUS

## 2018-07-11 MED ORDER — HEPARIN SOD (PORK) LOCK FLUSH 100 UNIT/ML IV SOLN
INTRAVENOUS | Status: AC
  Administered 2018-07-11: 03:00:00
  Filled 2018-07-11: qty 5

## 2018-07-11 MED ORDER — OXYCODONE-ACETAMINOPHEN 5-325 MG PO TABS: 2.0000 | ORAL_TABLET | ORAL | Status: DC | PRN

## 2018-07-11 MED ORDER — ACETAMINOPHEN 325 MG PO TABS
650.0000 mg | ORAL_TABLET | Freq: Four times a day (QID) | ORAL | Status: DC | PRN
  Administered 2018-07-12 – 2018-07-13 (×2): 650 mg via ORAL
  Filled 2018-07-11 (×2): qty 2

## 2018-07-11 MED ORDER — LEVETIRACETAM 500 MG PO TABS: 500.0000 mg | ORAL_TABLET | Freq: Two times a day (BID) | ORAL | 3 refills | Status: DC

## 2018-07-11 MED ORDER — SUCRALFATE 1 GM/10ML PO SUSP
1.0000 g | Freq: Three times a day (TID) | ORAL | Status: DC
  Administered 2018-07-11 – 2018-07-13 (×8): 1 g via ORAL
  Filled 2018-07-11 (×8): qty 10

## 2018-07-11 MED ORDER — HYDROMORPHONE HCL 1 MG/ML IJ SOLN
1.0000 mg | Freq: Once | INTRAMUSCULAR | Status: AC
  Administered 2018-07-11: 1 mg via INTRAVENOUS
  Filled 2018-07-11: qty 1

## 2018-07-11 MED ORDER — ONDANSETRON HCL 4 MG/2ML IJ SOLN
4.0000 mg | Freq: Four times a day (QID) | INTRAMUSCULAR | Status: DC | PRN
  Administered 2018-07-11: 4 mg via INTRAVENOUS
  Filled 2018-07-11: qty 2

## 2018-07-11 MED ORDER — MORPHINE SULFATE (PF) 4 MG/ML IV SOLN
4.0000 mg | Freq: Once | INTRAVENOUS | Status: AC
  Administered 2018-07-11: 4 mg via INTRAVENOUS
  Filled 2018-07-11: qty 1

## 2018-07-11 NOTE — ED Notes (Signed)
CRITICAL VALUE ALERT  Critical Value:  Platelets 4  Date & Time Notied:  07/11/17 0227  Provider Notified: Everlene Balls, MD  Orders Received/Actions taken: n/a

## 2018-07-11 NOTE — Progress Notes (Signed)
Per HPI: Kathleen Whitehead is a 68 y.o. female with medical history significant of anemia, asthma, chemotherapy induced pancytopenia, type 2 diabetes, ovarian cancer, endometrial cancer, hypertension, obesity, peripheral arterial disease, peripheral neuropathy, history of pneumonia, history of seizures presumptively due to hypomagnesemia who is coming to the emergency department due to precordial chest pain that started yesterday evening around 1900 and hour after she had chicken soup for dinner.  She describes the pain as burning in nature radiated to her mid chest, without any exacerbating or relieving factors, associated with mild dyspnea, but denies dizziness, nausea, emesis, diaphoresis or palpitations.  She denies PND, orthopnea or recent pitting edema of the lower extremities.  She denies recent constipation, diarrhea, melena or hematochezia.  No dysuria, frequency or hematuria.  Denies polyuria, polydipsia, polyphagia or blurred vision.  Patient seen and evaluated this morning and she states that she is feeling somewhat better.  She states that she does get some chest pain on occasion when she is noted to have low platelet counts, but this episode feels slightly different.    Patient was admitted for atypical chest pain evaluation along with chemotherapy-induced thrombocytopenia and has responded well to platelet transfusion with improvement noted.  Consulted hematology who will follow and add further recommendations as needed.  Patient is noted to have troponin elevation of 0.27 with no significant EKG changes at this time.  Consulted cardiology for further evaluation.  2D echocardiogram ordered as well for evaluation.  Avoiding antiplatelet agents at this time given thrombocytopenia.  Magnesium replacement had been ordered and will recheck with morning labs.  Appreciate further recommendations from consultations.  Continue on current diet as well as sliding scale insulin.

## 2018-07-11 NOTE — Progress Notes (Signed)
*  PRELIMINARY RESULTS* Echocardiogram 2D Echocardiogram has been performed. During apical portion of the exam, patient commented she needed to be done with the test. I asked her did she want me to stop? She said I could have 5 more minutes but shortly thereafter told me to stop the test.  Leavy Cella 07/11/2018, 1:41 PM

## 2018-07-11 NOTE — Progress Notes (Signed)
Patient is refusing medications and treatments at this time. Patient is combative with repeated attempts to get out of bed. Patient is high fall risk. MD has been informed. Office manager has been informed. Will continue to monitor patient

## 2018-07-11 NOTE — Consult Note (Signed)
[facility] Consultation Oncology  Name: Kathleen Whitehead      MRN: [id number]    Location: G867/Y195-09  Date: 07/11/2018 Time:5:03 PM   REFERRING PHYSICIAN: Dr. Manuella Ghazi  REASON FOR CONSULT: Pancytopenia from chemotherapy   DIAGNOSIS: Metastatic ovarian cancer  HISTORY OF PRESENT ILLNESS: She is a 68 year old pleasant white female who is seen in consultation today for severe pancytopenia from chemotherapy for metastatic ovarian cancer.  She received her first cycle of carboplatin AUC 4 on 06/22/2018.  On 07/09/2018, she developed chest discomfort after eating chicken noodle soup.  She came to the ER and was found to have elevated troponin.  She was also found to have severely low platelet count of 4 and hemoglobin of 7.4 with a white count of 2.3.  ANC was 1400.  She was evaluated by Dr. Bronson Ing from cardiology.  Conservative management was recommended.  She did receive platelet transfusions.  Today her platelet count is 71.  An echocardiogram done during hospitalization showed normal ejection fraction. Patient's husband is at bedside.  Patient is confused at this time.  PAST MEDICAL HISTORY:   Past Medical History:  Diagnosis Date  . Anemia   . Asthma   . Chemotherapy induced neutropenia ([place])   . Chemotherapy induced thrombocytopenia   . Diabetes mellitus without complication ([place])   . Endometrial cancer ([place])   . Hot flashes   . Hypertension   . Malignant neoplasm ([place])   . Malignant neoplasm of ovary ([place]) 04/30/2013  . Obesity   . Ovarian cancer ([place])   . Pancytopenia ([place])   . Peripheral artery disease ([place])   . Peripheral neuropathy   . Pneumonia 2015  . Port-A-Cath in place   . Seizures ([place]) 09/08/2016   ?? due to low magnesium    ALLERGIES: Allergies  Allergen Reactions  . Diphenhydramine Palpitations  . Iodinated Diagnostic Agents Other (See Comments)    Unknown  . Duloxetine Hcl Nausea And Vomiting  . Ibuprofen Other (See Comments)    Makes my bones hurt   . Penicillins Rash    Has patient had a PCN reaction causing immediate rash, facial/tongue/throat swelling, SOB or lightheadedness with hypotension: No Has patient had a PCN reaction causing severe rash involving mucus membranes or skin necrosis: No Has patient had a PCN reaction that required hospitalization: No Has patient had a PCN reaction occurring within the last 10 years: No If all of the above answers are "NO", then may proceed with Cephalosporin use.       MEDICATIONS: I have reviewed the patient's current medications.     PAST SURGICAL HISTORY Past Surgical History:  Procedure Laterality Date  . ABDOMINAL HYSTERECTOMY  2014    FAMILY HISTORY: Family History  Problem Relation Age of Onset  . Heart failure Mother   . Stroke Sister   . Cancer Sister   . Diabetes Sister   . Leukemia Sister   . Diabetes Brother     SOCIAL HISTORY:  reports that she has never smoked. She has never used smokeless tobacco. She reports that she does not drink alcohol or use drugs.  PERFORMANCE STATUS: The patient's performance status is 2 - Symptomatic, <50% confined to bed  PHYSICAL EXAM: Most Recent Vital Signs: Blood pressure (!) 121/98, pulse 76, temperature 98.6 F (37 C), temperature source Oral, resp. rate 19, height 5\' 9"  (1.753 m), weight 212 lb (96.2 kg), SpO2 100 %. BP (!) 121/98   Pulse 76   Temp 98.6 F (37 C) (Oral)  Resp 19   Ht 5\' 9"  (1.753 m)   Wt 212 lb (96.2 kg)   SpO2 100%   BMI 31.31 kg/m  General appearance: alert and cooperative Lungs: clear to auscultation bilaterally Heart: regular rate and rhythm Abdomen: soft, non-tender; bowel sounds normal; no masses,  no organomegaly Extremities: 1+ edema bilaterally.  Petechial rash. Skin: Petechial rash on the lower extremities. Neurologic: Grossly normal Patient is not oriented to person or place.  LABORATORY DATA:  Results for orders placed or performed during the hospital encounter of 07/11/18 (from the  past 48 hour(s))  I-stat troponin, ED     Status: None   Collection Time: 07/11/18  1:57 AM  Result Value Ref Range   Troponin i, poc 0.04 0.00 - 0.08 ng/mL   Comment 3            Comment: Due to the release kinetics of cTnI, a negative result within the first hours of the onset of symptoms does not rule out myocardial infarction with certainty. If myocardial infarction is still suspected, repeat the test at appropriate intervals.   Basic metabolic panel     Status: Abnormal   Collection Time: 07/11/18  2:00 AM  Result Value Ref Range   Sodium 135 135 - 145 mmol/L   Potassium 4.0 3.5 - 5.1 mmol/L   Chloride 101 98 - 111 mmol/L   CO2 27 22 - 32 mmol/L   Glucose, Bld 286 (H) 70 - 99 mg/dL   BUN 34 (H) 8 - 23 mg/dL   Creatinine, Ser 1.50 (H) 0.44 - 1.00 mg/dL   Calcium 12.1 (H) 8.9 - 10.3 mg/dL   GFR calc non Af Amer 35 (L) >60 mL/min   GFR calc Af Amer 41 (L) >60 mL/min   Anion gap 7 5 - 15    Comment: Performed at [facility], [address], [place] [address]  CBC with Differential     Status: Abnormal   Collection Time: 07/11/18  2:00 AM  Result Value Ref Range   WBC 2.3 (L) 4.0 - 10.5 K/uL    Comment: WHITE COUNT CONFIRMED ON SMEAR   RBC 2.42 (L) 3.87 - 5.11 MIL/uL   Hemoglobin 7.4 (L) 12.0 - 15.0 g/dL   HCT 23.6 (L) 36.0 - 46.0 %   MCV 97.5 80.0 - 100.0 fL   MCH 30.6 26.0 - 34.0 pg   MCHC 31.4 30.0 - 36.0 g/dL   RDW 12.5 11.5 - 15.5 %   Platelets 4 (LL) 150 - 400 K/uL    Comment: PLATELET COUNT CONFIRMED BY SMEAR SPECIMEN CHECKED FOR CLOTS THIS CRITICAL RESULT HAS VERIFIED AND BEEN CALLED TO [place],RN BY MARIE KELLY ON 01 07 2020 AT 0227, AND HAS BEEN READ BACK.     nRBC 0.0 0.0 - 0.2 %   Neutrophils Relative % 63 %   Neutro Abs 1.4 (L) 1.7 - 7.7 K/uL   Lymphocytes Relative 31 %   Lymphs Abs 0.7 0.7 - 4.0 K/uL   Monocytes Relative 6 %   Monocytes Absolute 0.1 0.1 - 1.0 K/uL   Eosinophils Relative 0 %   Eosinophils Absolute 0.0 0.0 - 0.5 K/uL    Basophils Relative 0 %   Basophils Absolute 0.0 0.0 - 0.1 K/uL   Immature Granulocytes 0 %   Abs Immature Granulocytes 0.00 0.00 - 0.07 K/uL    Comment: Performed at [facility], [address], [place], [place] [address]  Protime-INR     Status: None  Collection Time: 07/11/18  2:00 AM  Result Value Ref Range   Prothrombin Time 12.7 11.4 - 15.2 seconds   INR 0.96     Comment: Performed at [facility], [address], [place], [place] [address]  APTT     Status: Abnormal   Collection Time: 07/11/18  2:00 AM  Result Value Ref Range   aPTT 22 (L) 24 - 36 seconds    Comment: Performed at [facility], [address] [place], [place] [address]  Fibrinogen     Status: Abnormal   Collection Time: 07/11/18  2:00 AM  Result Value Ref Range   Fibrinogen 580 (H) 210 - 475 mg/dL    Comment: Performed at [facility], [address] [place], [place] [address]  D-dimer, quantitative (not at [facility])     Status: Abnormal   Collection Time: 07/11/18  2:00 AM  Result Value Ref Range   D-Dimer, Quant 1.50 (H) 0.00 - 0.50 ug/mL-FEU    Comment: (NOTE) At the manufacturer cut-off of 0.50 ug/mL FEU, this assay has been documented to exclude PE with a sensitivity and negative predictive value of 97 to 99%.  At this time, this assay has not been approved by the FDA to exclude DVT/VTE. Results should be correlated with clinical presentation. Performed at [facility], [address] [place], [place] [address]   Magnesium     Status: Abnormal   Collection Time: 07/11/18  2:00 AM  Result Value Ref Range   Magnesium 1.3 (L) 1.7 - 2.4 mg/dL    Comment: Performed at [facility], [address], [place], [place] [address]  Phosphorus     Status: None   Collection Time: 07/11/18  2:00 AM  Result Value Ref Range   Phosphorus 2.6 2.5 - 4.6 mg/dL    Comment: Performed at [facility], [address], [place], [place] [address]  Hepatic function panel     Status: Abnormal   Collection Time: 07/11/18  2:00 AM   Result Value Ref Range   Total Protein 6.5 6.5 - 8.1 g/dL   Albumin 3.0 (L) 3.5 - 5.0 g/dL   AST 21 15 - 41 U/L   ALT 16 0 - 44 U/L   Alkaline Phosphatase 54 38 - 126 U/L   Total Bilirubin 0.6 0.3 - 1.2 mg/dL   Bilirubin, Direct 0.1 0.0 - 0.2 mg/dL   Indirect Bilirubin 0.5 0.3 - 0.9 mg/dL    Comment: Performed at [facility], [address], [place], [place] [address]  Type and screen [facility]     Status: None   Collection Time: 07/11/18  3:25 AM  Result Value Ref Range   ABO/RH(D) A POS    Antibody Screen NEG    Sample Expiration      07/14/2018 Performed at [facility], [address], [place],  [address]   Prepare Pheresed Platelets     Status: None (Preliminary result)   Collection Time: 07/11/18  3:25 AM  Result Value Ref Range   Unit Number [id number]    Blood Component Type PLTP LR2 PAS    Unit division 00    Status of Unit ISSUED    Transfusion Status OK TO TRANSFUSE    Unit Number [id number]    Blood Component Type PLTP LR1 PAS    Unit division 00    Status of Unit ISSUED    Transfusion Status      OK TO TRANSFUSE Performed at [facility], [address], [place],  [place] [address]    Unit Number [id number]    Blood Component Type PLTPHER LI3    Unit division 00    Status of Unit ISSUED    Transfusion Status OK TO TRANSFUSE    Unit Number [id number]    Blood Component Type PLTP LI1 PAS    Unit division 00    Status of Unit ISSUED    Transfusion Status OK TO TRANSFUSE   Troponin I - Now Then Q6H     Status: Abnormal   Collection Time: 07/11/18  9:02 AM  Result Value Ref Range   Troponin I 0.27 (HH) <0.03 ng/mL    Comment: CRITICAL RESULT CALLED TO, READ BACK BY AND VERIFIED WITH: [email or web address],[email or web address]  BY MATTHEWS B 1.7.2020 Performed at [facility], [address], [place], [place] [address]   CBC with Differential/Platelet     Status: Abnormal   Collection Time: 07/11/18  9:02 AM  Result Value Ref Range   WBC 2.9 (L) 4.0 - 10.5  K/uL   RBC 2.27 (L) 3.87 - 5.11 MIL/uL   Hemoglobin 7.1 (L) 12.0 - 15.0 g/dL   HCT 22.4 (L) 36.0 - 46.0 %   MCV 98.7 80.0 - 100.0 fL   MCH 31.3 26.0 - 34.0 pg   MCHC 31.7 30.0 - 36.0 g/dL   RDW 12.6 11.5 - 15.5 %   Platelets 71 (L) 150 - 400 K/uL    Comment: SPECIMEN CHECKED FOR CLOTS Immature Platelet Fraction may be clinically indicated, consider ordering this additional test [id number] CONSISTENT WITH PREVIOUS RESULT DELTA CHECK NOTED    nRBC 0.0 0.0 - 0.2 %   Neutrophils Relative % 61 %   Neutro Abs 1.8 1.7 - 7.7 K/uL   Lymphocytes Relative 31 %   Lymphs Abs 0.9 0.7 - 4.0 K/uL   Monocytes Relative 8 %   Monocytes Absolute 0.2 0.1 - 1.0 K/uL   Eosinophils Relative 0 %   Eosinophils Absolute 0.0 0.0 - 0.5 K/uL   Basophils Relative 0 %   Basophils Absolute 0.0 0.0 - 0.1 K/uL   Immature Granulocytes 0 %   Abs Immature Granulocytes 0.01 0.00 - 0.07 K/uL    Comment: Performed at [facility], [address] [place], [place] [address]  Basic metabolic panel     Status: Abnormal   Collection Time: 07/11/18  9:02 AM  Result Value Ref Range   Sodium 138 135 - 145 mmol/L   Potassium 3.8 3.5 - 5.1 mmol/L   Chloride 103 98 - 111 mmol/L   CO2 26 22 - 32 mmol/L   Glucose, Bld 225 (H) 70 - 99 mg/dL   BUN 33 (H) 8 - 23 mg/dL   Creatinine, Ser 1.35 (H) 0.44 - 1.00 mg/dL   Calcium 11.9 (H) 8.9 - 10.3 mg/dL   GFR calc non Af Amer 40 (L) >60 mL/min   GFR calc Af Amer 47 (L) >60 mL/min   Anion gap 9 5 - 15    Comment: Performed at [facility], [address] [place],  [address]  Glucose, capillary     Status: Abnormal   Collection Time: 07/11/18 12:02 PM  Result Value Ref Range   Glucose-Capillary 197 (H) 70 - 99 mg/dL      RADIOGRAPHY: Dg Chest 2 View  Result Date: 07/11/2018 CLINICAL DATA:  Chest pain. Shortness of breath and lower extremity swelling. Chemotherapy 3 weeks prior 4 recurrent ovarian/uterine cancer with lung metastasis. EXAM: CHEST - 2 VIEW COMPARISON:  Chest  CT 06/05/2018 FINDINGS:  Low lung volumes. Right chest port with tip in the mid SVC. Chronic elevation of left hemidiaphragm. Unchanged heart size and mediastinal contours. Minor streaky left lung base atelectasis. No pulmonary edema, focal airspace disease, pleural effusion or pneumothorax. Pulmonary nodules on CT are not well demonstrated radiographically. No acute osseous abnormalities. IMPRESSION: 1. Low lung volumes with streaky left basilar atelectasis. 2. Known pulmonary nodules are not well demonstrated radiographically. Electronically Signed   By: Keith Rake M.D.   On: 07/11/2018 02:54        ASSESSMENT and PLAN:  1.  Pancytopenia: - Patient receiving chemotherapy for metastatic ovarian cancer. -Cycle 1 of carboplatin on 06/22/2018. -Pancytopenia from myelosuppression from chemotherapy. -Presented with platelet count of 4, status post 2 units with platelet count of 71. -Currently receiving another unit of platelets. -Hemoglobin is 7.4.  Would recommend 1 unit of PRBC. - Once patient is alert, we will discuss whether to further continue chemotherapy as she is clearly not able to tolerate it. -We will monitor counts while she is hospitalized.  2.  Chest pain: -Managed conservatively. - May start aspirin 81 mg if platelet count stabilized.  All questions were answered. The patient knows to call the clinic with any problems, questions or concerns. We can certainly see the patient much sooner if necessary.   Derek Jack

## 2018-07-11 NOTE — Progress Notes (Signed)
**Note De-Identified  Obfuscation** EKG complete and MD notified of results

## 2018-07-11 NOTE — Consult Note (Addendum)
Cardiology Consult    Patient ID: Kathleen Whitehead; [id number]; [birth year]   Admit date: 07/11/2018 Date of Consult: 07/11/2018  Primary Care Provider: [name], MD Primary Cardiologist: New to [facility] - Dr. Bronson Ing  Patient Profile    Kathleen Whitehead is a 68 y.o. female with past medical history of metastatic ovarian cancer (currently undergoing chemotherapy with chemo-induced pancytopenia), HTN, Type 2 DM, and no prior cardiac history who is being seen today for the evaluation of chest pain and elevated troponin values at the request of Dr. Manuella Ghazi.   History of Present Illness    Kathleen Whitehead presented to [facility] ED during the early morning hours for evaluation of chest discomfort which had started the previous day. She reports being in her usual state of health until yesterday evening when she developed chest discomfort within several minutes after consuming chicken noodle soup for supper. Her pain started around 1900 and lasted until 0100 the following morning. Describes it as an aching sensation. Not exacerbated with exertion or positional changes. She denies any associated dyspnea, nausea, vomiting, or diaphoresis. Reports her pain has resolved this morning and she denies any recurrent symptoms.  She denies any prior cardiac history but does report that her mother died of an MI at age 59. No known CAD in her siblings.  Initial labs show WBC 2.3, Hgb 7.4, platelets 4 K, Na+ 135, K+ 4.0, and creatinine 1.50 (baseline creatinine 1.2 - 1.3). Magnesium 1.3. D-dimer elevated to 1.50. Initial troponin 0.62 with cyclic values pending. CXR showing left bibasilar atelectasis with known pulmonary nodules. EKG shows sinus tachycardia, HR 113, with PAC's and PVC's and slight ST depression along lateral leads.    She has received 2 platelet transfusions with repeat platelets at 71 K. Repeat Mg and troponin values are pending.    Past Medical History:  Diagnosis Date  . Anemia   . Asthma   .  Chemotherapy induced neutropenia ([place])   . Chemotherapy induced thrombocytopenia   . Diabetes mellitus without complication ([place])   . Endometrial cancer ([place])   . Hot flashes   . Hypertension   . Malignant neoplasm ([place])   . Malignant neoplasm of ovary ([place]) 04/30/2013  . Obesity   . Ovarian cancer ([place])   . Pancytopenia ([place])   . Peripheral artery disease ([place])   . Peripheral neuropathy   . Pneumonia 2015  . Port-A-Cath in place   . Seizures ([place]) 09/08/2016   ?? due to low magnesium    Past Surgical History:  Procedure Laterality Date  . ABDOMINAL HYSTERECTOMY  2014     Home Medications:  Prior to Admission medications   Medication Sig Start Date End Date Taking? Authorizing Provider  acetaminophen (TYLENOL) 500 MG tablet Take 500 mg by mouth every 6 (six) hours as needed for mild pain or moderate pain.     [provider]  aspirin EC 325 MG tablet Take 325 mg by mouth daily.  09/15/15   [provider]  calcium carbonate (TUMS EX) 750 MG chewable tablet Chew 1 tablet by mouth daily as needed for heartburn.    [provider]  calcium carbonate (TUMS SMOOTHIES) 750 MG chewable tablet Chew 1 tablet by mouth daily as needed for heartburn.    [provider]  CARBOPLATIN IV Inject into the vein every 21 ( twenty-one) days.    [provider]  cetirizine (ZYRTEC) 10 MG tablet Take 10 mg by mouth daily.     [provider]  Cholecalciferol (VITAMIN D PO) Take 1 tablet by mouth daily.    [provider]  cyanocobalamin 1000 MCG tablet Take 1,000 mcg by mouth daily.    [provider]  cyclobenzaprine (FLEXERIL) 10 MG tablet Take 10 mg by mouth 3 (three) times daily as needed for muscle spasms.    [provider]  furosemide (LASIX) 20 MG tablet Take 1 tablet (20 mg total) by mouth daily. 01/24/18   Orson Eva, MD  glipiZIDE (GLUCOTROL) 10 MG tablet TAKE 1 TABLET BY MOUTH TWICE A DAY 01/23/16   [provider]  levETIRAcetam (KEPPRA) 500 MG tablet Take 1 tablet (500 mg total) by mouth 2 (two) times daily. 12/01/17   Higgs, Mathis Dad, MD  lidocaine-prilocaine (EMLA) cream Apply small amount to port site one hour prior to appointment. Cover with plastic wrap Patient not taking: Reported on 06/22/2018 06/12/18   Derek Jack, MD  loperamide (IMODIUM) 2 MG capsule Take 2 mg by mouth as needed for diarrhea or loose stools.     [provider]  metFORMIN (GLUCOPHAGE) 1000 MG tablet Take 1,000 mg by mouth 2 (two) times daily with a meal.     [provider]  Omega-3 Fatty Acids (FISH OIL) 1000 MG CAPS Take 1,000 mg by mouth daily.     [provider]  oxyCODONE (OXY IR/ROXICODONE) 5 MG immediate release tablet Take 1 tablet (5 mg total) by mouth every 6 (six) hours as needed for severe pain. 06/30/18   Higgs, Mathis Dad, MD  oxyCODONE-acetaminophen (PERCOCET) 10-325 MG tablet Take 1 tablet by mouth every 4 (four) hours as needed for pain. 06/23/18   Glennie Isle, NP-C  Polyethylene Glycol 3350 (MIRALAX PO) Take 17 g by mouth daily as needed for moderate constipation.     [provider]  prochlorperazine (COMPAZINE) 10 MG tablet Take 1 tablet (10 mg total) by mouth every 6 (six) hours as needed for nausea or vomiting. 05/02/18   Derek Jack, MD  simethicone (GAS-X) 80 MG chewable tablet Chew 80 mg by mouth every 6 (six) hours as needed for flatulence.    [provider]    Inpatient Medications: Scheduled Meds: . alum & mag hydroxide-simeth  30 mL Oral Once   And  . lidocaine  15 mL Oral Once  . hyoscyamine  0.25 mg Sublingual Once  . insulin aspart  0-15 Units Subcutaneous TID WC  . sucralfate  1 g Oral TID WC & HS   Continuous Infusions:  PRN Meds: acetaminophen **OR** acetaminophen, ondansetron (ZOFRAN) IV, oxyCODONE, oxyCODONE-acetaminophen  Allergies:    Allergies  Allergen Reactions  . Diphenhydramine Palpitations  .  Iodinated Diagnostic Agents Other (See Comments)    Unknown  . Duloxetine Hcl Nausea And Vomiting  . Ibuprofen Other (See Comments)    Makes my bones hurt  . Penicillins Rash    Has patient had a PCN reaction causing immediate rash, facial/tongue/throat swelling, SOB or lightheadedness with hypotension: No Has patient had a PCN reaction causing severe rash involving mucus membranes or skin necrosis: No Has patient had a PCN reaction that required hospitalization: No Has patient had a PCN reaction occurring within the last 10 years: No If all of the above answers are "NO", then may proceed with Cephalosporin use.     Social History:   Social History   Socioeconomic History  . Marital status: Married    Spouse name: Not on file  . Number of children: Not on file  .  Years of education: Not on file  . Highest education level: Not on file  Occupational History  . Not on file  Social Needs  . Financial resource strain: Not on file  . Food insecurity:    Worry: Not on file    Inability: Not on file  . Transportation needs:    Medical: Not on file    Non-medical: Not on file  Tobacco Use  . Smoking status: Never Smoker  . Smokeless tobacco: Never Used  Substance and Sexual Activity  . Alcohol use: No  . Drug use: No  . Sexual activity: Not Currently  Lifestyle  . Physical activity:    Days per week: Not on file    Minutes per session: Not on file  . Stress: Not on file  Relationships  . Social connections:    Talks on phone: Not on file    Gets together: Not on file    Attends religious service: Not on file    Active member of club or organization: Not on file    Attends meetings of clubs or organizations: Not on file    Relationship status: Not on file  . Intimate partner violence:    Fear of current or ex partner: Not on file    Emotionally abused: Not on file    Physically abused: Not on file    Forced sexual activity: Not on file  Other Topics Concern  . Not on  file  Social History Narrative  . Not on file     Family History:    Family History  Problem Relation Age of Onset  . Heart failure Mother   . Stroke Sister   . Cancer Sister   . Diabetes Sister   . Leukemia Sister   . Diabetes Brother       Review of Systems    General:  No chills, fever, night sweats or weight changes.  Cardiovascular:  No dyspnea on exertion, edema, orthopnea, palpitations, paroxysmal nocturnal dyspnea. Positive for chest pain.  Dermatological: No rash, lesions/masses. Positive for easy bruising.  Respiratory: No cough, dyspnea Urologic: No hematuria, dysuria Abdominal:   No nausea, vomiting, diarrhea, bright red blood per rectum, melena, or hematemesis Neurologic:  No visual changes, wkns, changes in mental status. All other systems reviewed and are otherwise negative except as noted above.  Physical Exam/Data    Vitals:   07/11/18 0708 07/11/18 0730 07/11/18 0800 07/11/18 0907  BP: (!) 158/80 (!) 158/72 118/88 (!) 172/73  Pulse: 96 94  92  Resp: 18 17  18   Temp: 97.9 F (36.6 C)   98 F (36.7 C)  TempSrc: Oral   Oral  SpO2: 97%   98%  Weight:      Height:        Intake/Output Summary (Last 24 hours) at 07/11/2018 1102 Last data filed at 07/11/2018 0634 Gross per 24 hour  Intake 203 ml  Output 100 ml  Net 103 ml   Filed Weights   07/11/18 0106  Weight: 96.2 kg   Body mass index is 31.31 kg/m.   General: Pleasant, Caucasian female appearing in NAD. Diffuse petechiae along upper and lower extremities.  Psych: Normal affect. Neuro: Alert and oriented X 3. Moves all extremities spontaneously. HEENT: Normal  Neck: Supple without bruits or JVD. Lungs:  Resp regular and unlabored, CTA without wheezing or rales. Heart: RRR no s3, s4, or murmurs. Abdomen: Soft, non-tender, non-distended, BS + x 4.  Extremities: No clubbing, cyanosis or edema.  DP/PT/Radials 2+ and equal bilaterally.   EKG:  The EKG was personally reviewed and demonstrates:  Sinus tachycardia, HR 113, with PAC's and PVC's and slight ST depression along lateral leads.    Telemetry:  Not connected.    Labs/Studies     Relevant CV Studies:  Echocardiogram: 01/2018 Study Conclusions  - Left ventricle: The cavity size was normal. Wall thickness was   increased in a pattern of mild LVH. Systolic function was normal.   The estimated ejection fraction was in the range of 55% to 60%.   Wall motion was normal; there were no regional wall motion   abnormalities. Doppler parameters are consistent with abnormal   left ventricular relaxation (grade 1 diastolic dysfunction). - Aortic valve: Mildly calcified annulus. Trileaflet. - Mitral valve: Mildly calcified annulus. There was trivial   regurgitation. - Left atrium: The atrium was mildly dilated. - Right atrium: Central venous pressure (est): 3 mm Hg. - Atrial septum: No defect or patent foramen ovale was identified. - Tricuspid valve: There was trivial regurgitation. - Pulmonary arteries: Systolic pressure could not be accurately   estimated. - Pericardium, extracardiac: There was no pericardial effusion.   Laboratory Data:  Chemistry Recent Labs  Lab 07/11/18 0200 07/11/18 0902  NA 135 138  K 4.0 3.8  CL 101 103  CO2 27 26  GLUCOSE 286* 225*  BUN 34* 33*  CREATININE 1.50* 1.35*  CALCIUM 12.1* 11.9*  GFRNONAA 35* 40*  GFRAA 41* 47*  ANIONGAP 7 9    Recent Labs  Lab 07/11/18 0200  PROT 6.5  ALBUMIN 3.0*  AST 21  ALT 16  ALKPHOS 54  BILITOT 0.6   Hematology Recent Labs  Lab 07/11/18 0200 07/11/18 0902  WBC 2.3* 2.9*  RBC 2.42* 2.27*  HGB 7.4* 7.1*  HCT 23.6* 22.4*  MCV 97.5 98.7  MCH 30.6 31.3  MCHC 31.4 31.7  RDW 12.5 12.6  PLT 4* 71*   Cardiac Enzymes Recent Labs  Lab 07/11/18 0902  TROPONINI 0.27*    Recent Labs  Lab 07/11/18 0157  TROPIPOC 0.04    BNPNo results for input(s): BNP, PROBNP in the last 168 hours.  DDimer  Recent Labs  Lab 07/11/18 0200    DDIMER 1.50*    Radiology/Studies:  Dg Chest 2 View  Result Date: 07/11/2018 CLINICAL DATA:  Chest pain. Shortness of breath and lower extremity swelling. Chemotherapy 3 weeks prior 4 recurrent ovarian/uterine cancer with lung metastasis. EXAM: CHEST - 2 VIEW COMPARISON:  Chest CT 06/05/2018 FINDINGS: Low lung volumes. Right chest port with tip in the mid SVC. Chronic elevation of left hemidiaphragm. Unchanged heart size and mediastinal contours. Minor streaky left lung base atelectasis. No pulmonary edema, focal airspace disease, pleural effusion or pneumothorax. Pulmonary nodules on CT are not well demonstrated radiographically. No acute osseous abnormalities. IMPRESSION: 1. Low lung volumes with streaky left basilar atelectasis. 2. Known pulmonary nodules are not well demonstrated radiographically. Electronically Signed   By: Keith Rake M.D.   On: 07/11/2018 02:54     Assessment & Plan    1. Chest Pain with Elevated Troponin Values - Presented with chest discomfort which occurred after consuming dinner and lasted for over 6 hours. No association with exertion or positional changes. She denies any recurrent chest pain since admission. Initial troponin found to be elevated to 0.53 with cyclic values pending. EKG shows slight ST depression along the lateral leads but no definitive diagnostic changes. Would continue to cycle enzymes and repeat 12-Lead EKG. An  echocardiogram is pending to assess LV function and wall motion. She would not be a candidate for any type of invasive cardiac evaluation at this time given chronic anemia and severe thrombocytopenia. Would not start Heparin given the same. Would recommend placing on telemetry given her elevated enzymes and also electrolyte abnormalities which can further trigger arrhythmias. Would recommend medical management at this time with low-dose BB and consideration of statin therapy (will add on FLP). Plan to re-initiate ASA 81mg  daily prior to  discharge pending stabilization of platelet count.   2. Hypomagnesemia - Mg 1.3 upon admission. Has received supplementation with repeat labs pending.   3. Pancytopenia with Severe Thrombocytopenia - platelet count 4K upon admission and noted to have diffuse petechiae on examination. Receiving platelet transfusions with repeat labs showing platelet count improved to 71 K.  - per admitting team.   4. Metastatic Ovarian Cancer -  Followed by Oncology as an outpatient.   For questions or updates, please contact [place] Please consult [email or web address] for contact info under Cardiology/STEMI.  Signed, Erma Heritage, PA-C 07/11/2018, 11:02 AM Pager: [phone]  The patient was seen and examined, and I agree with the history, physical exam, assessment and plan as documented above, with modifications as noted below. I have also personally reviewed all relevant documentation, old records, labs, and both radiographic and cardiovascular studies. I have also independently interpreted old and new ECG's.  At the time of my evaluation, the patient's husband and her nurse were present.  An ECG was also being performed by a technician.  As stated above, she is a 68 year old woman with a past medical history significant for metastatic ovarian cancer and undergoing chemotherapy with chemotherapy-induced pancytopenia.  She also has hypertension and type 2 diabetes mellitus.  She has no prior cardiac history.  She developed chest pain yesterday after eating chicken noodle soup.  The pain is constant and lasted roughly 5 to 6 hours.  When I asked her about it she said the last time she had chest pain was at midnight.  She denies any associated shortness of breath, diaphoresis, and palpitations.  Labs reviewed above with initial platelet count of 4000.  Creatinine was 1.5.  Troponin was 0.27.  I personally reviewed the initial ECG which demonstrated sinus tachycardia with PACs and PVCs.  I  personally reviewed ECG which was just performed which demonstrated normal sinus rhythm without ischemic ST segment or T wave abnormalities, nor any arrhythmias.  She currently denies chest pain and shortness of breath.  She is chronically ill-appearing. Cardiac exam demonstrated regular rate and rhythm with normal S1 and S2 without S3 or S4 and no murmurs.  There was no evidence of lower extremity edema.  An echocardiogram has been ordered and is pending at the time of my evaluation.  Given her anemia and thrombocytopenia, she is not a candidate for antiplatelet therapy at this time.  Heart rate currently in the 80 range with systolic blood pressures in the 160 range.  I will start Lopressor 12.5 mg twice daily.  Kate Sable, MD, [facility]  07/11/2018 12:45 PM   Addendum:  Echocardiogram showed normal LV systolic function and regional wall motion, EF 60-65%.  No further recommendations at this time.  [facility] will sign off.   Medication Recommendations:  As above Other recommendations (labs, testing, etc):  None Follow up as an outpatient:  PRN.

## 2018-07-11 NOTE — ED Provider Notes (Addendum)
[facility] EMERGENCY DEPARTMENT Provider Note   CSN: [id number] Arrival date & time: 07/11/18  0056     History   Chief Complaint Chief Complaint  Patient presents with  . Chest Pain    HPI Kathleen Whitehead is a 68 y.o. female.  Patient is a 68 year old female with past medical history of endometrial cancer currently undergoing chemotherapy.  She also has a history of hypertension, diabetes.  She presents today for evaluation of chest discomfort.  This started this evening at approximately 7:00 while she was at rest.  She denies any shortness of breath, nausea, diaphoresis, or radiation of the pain to her arm or jaw.  She denies any fevers, chills, or cough.  The history is provided by the patient.  Chest Pain  Pain location:  Substernal area Pain quality: sharp   Pain radiates to:  Does not radiate Pain severity:  Moderate Onset quality:  Sudden Timing:  Constant Progression:  Unchanged Chronicity:  New Relieved by:  Nothing Worsened by:  Movement and certain positions   Past Medical History:  Diagnosis Date  . Anemia   . Asthma   . Chemotherapy induced neutropenia ([place])   . Chemotherapy induced thrombocytopenia   . Diabetes mellitus without complication ([place])   . Endometrial cancer ([place])   . Hot flashes   . Hypertension   . Malignant neoplasm ([place])   . Malignant neoplasm of ovary ([place]) 04/30/2013  . Obesity   . Ovarian cancer ([place])   . Pancytopenia ([place])   . Peripheral artery disease ([place])   . Peripheral neuropathy   . Pneumonia 2015  . Port-A-Cath in place   . Seizures ([place]) 09/08/2016   ?? due to low magnesium    Patient Active Problem List   Diagnosis Date Noted  . Acute diastolic CHF (congestive heart failure) ([place]) 01/24/2018  . CKD (chronic kidney disease) stage 3, GFR 30-59 ml/min (HCC) 01/24/2018  . Atypical chest pain   . SOB (shortness of breath) 01/22/2018  . Goals of care, counseling/discussion 09/21/2016  . Encounter for orogastric (OG)  tube placement   . Encounter for intubation   . Dyspnea   . Status epilepticus ([place]) 09/08/2016  . Acute respiratory failure ([place])   . 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 Axillary lump 12/08/2015  . Blush 03/11/2015  . Acquired pancytopenia ([place]) 12/09/2014  . Presence of other vascular implants and grafts 04/18/2014  . Drug-induced low platelet count 12/04/2013  . Peripheral nerve disease 12/04/2013  . Chemotherapy-induced neutropenia ([place]) 08/24/2013  . Malignant neoplasm of endometrium ([place]) 06/11/2013  . Obesity (BMI 30-39.9) 05/07/2013  . Abdominal pain 04/30/2013  . Acute blood loss anemia 04/30/2013  . Type 2 diabetes mellitus ([place]) 04/30/2013  . BP (high blood pressure) 04/30/2013  . 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 ABDOMINAL HYSTERECTOMY  2014     OB History   No obstetric history on file.      Home Medications    Prior to Admission medications   Medication Sig Start Date End Date Taking? Authorizing Provider  acetaminophen (TYLENOL) 500 MG tablet Take 500 mg by mouth every 6 (six) hours as needed for mild pain or moderate pain.     [provider]  aspirin EC 325 MG tablet Take 325 mg by mouth daily.  09/15/15   [provider]  calcium carbonate (TUMS EX) 750 MG chewable tablet Chew 1 tablet by mouth daily as needed for  heartburn.    [provider]  calcium carbonate (TUMS SMOOTHIES) 750 MG chewable tablet Chew 1 tablet by mouth daily as needed for heartburn.    [provider]  CARBOPLATIN IV Inject into the vein every 21 ( twenty-one) days.    [provider]  cetirizine (ZYRTEC) 10 MG tablet Take 10 mg by mouth daily.     [provider]  Cholecalciferol (VITAMIN D PO) Take 1 tablet by mouth daily.    [provider]  cyanocobalamin 1000 MCG tablet Take 1,000 mcg by mouth daily.    [provider]    cyclobenzaprine (FLEXERIL) 10 MG tablet Take 10 mg by mouth 3 (three) times daily as needed for muscle spasms.    [provider]  furosemide (LASIX) 20 MG tablet Take 1 tablet (20 mg total) by mouth daily. 01/24/18   Orson Eva, MD  glipiZIDE (GLUCOTROL) 10 MG tablet TAKE 1 TABLET BY MOUTH TWICE A DAY 01/23/16   [provider]  levETIRAcetam (KEPPRA) 500 MG tablet Take 1 tablet (500 mg total) by mouth 2 (two) times daily. 12/01/17   Higgs, Mathis Dad, MD  lidocaine-prilocaine (EMLA) cream Apply small amount to port site one hour prior to appointment. Cover with plastic wrap Patient not taking: Reported on 06/22/2018 06/12/18   Derek Jack, MD  loperamide (IMODIUM) 2 MG capsule Take 2 mg by mouth as needed for diarrhea or loose stools.     [provider]  metFORMIN (GLUCOPHAGE) 1000 MG tablet Take 1,000 mg by mouth 2 (two) times daily with a meal.     [provider]  Omega-3 Fatty Acids (FISH OIL) 1000 MG CAPS Take 1,000 mg by mouth daily.     [provider]  oxyCODONE (OXY IR/ROXICODONE) 5 MG immediate release tablet Take 1 tablet (5 mg total) by mouth every 6 (six) hours as needed for severe pain. 06/30/18   Higgs, Mathis Dad, MD  oxyCODONE-acetaminophen (PERCOCET) 10-325 MG tablet Take 1 tablet by mouth every 4 (four) hours as needed for pain. 06/23/18   Glennie Isle, NP-C  Polyethylene Glycol 3350 (MIRALAX PO) Take 17 g by mouth daily as needed for moderate constipation.     [provider]  prochlorperazine (COMPAZINE) 10 MG tablet Take 1 tablet (10 mg total) by mouth every 6 (six) hours as needed for nausea or vomiting. 05/02/18   Derek Jack, MD  simethicone (GAS-X) 80 MG chewable tablet Chew 80 mg by mouth every 6 (six) hours as needed for flatulence.    [provider]    Family History Family History  Problem Relation Age of Onset  . Heart failure Mother   . Stroke Sister   . Cancer Sister   . Diabetes Sister    . Leukemia Sister   . Diabetes Brother     Social History Social History   Tobacco Use  . Smoking status: Never Smoker  . Smokeless tobacco: Never Used  Substance Use Topics  . Alcohol use: No  . Drug use: No     Allergies   Diphenhydramine; Iodinated diagnostic agents; Duloxetine hcl; Ibuprofen; and Penicillins   Review of Systems Review of Systems  Cardiovascular: Positive for chest pain.  All other systems reviewed and are negative.    Physical Exam Updated Vital Signs BP (!) 161/97   Pulse (!) 109   Temp 97.7 F (36.5 C) (Oral)   Resp 20   Ht 5\' 9"  (1.753 m)   Wt 96.2 kg   SpO2  96%   BMI 31.31 kg/m   Physical Exam Vitals signs and nursing note reviewed.  Constitutional:      General: She is not in acute distress.    Appearance: She is well-developed. She is not diaphoretic.  HENT:     Head: Normocephalic and atraumatic.  Neck:     Musculoskeletal: Normal range of motion and neck supple.  Cardiovascular:     Rate and Rhythm: Normal rate and regular rhythm.     Heart sounds: No murmur. No friction rub. No gallop.   Pulmonary:     Effort: Pulmonary effort is normal. No respiratory distress.     Breath sounds: Normal breath sounds. No wheezing.     Comments: There is tenderness to palpation of the anterior chest wall.  This reproduces her symptoms.  There is no crepitus or palpable abnormality. Abdominal:     General: Bowel sounds are normal. There is no distension.     Palpations: Abdomen is soft.     Tenderness: There is no abdominal tenderness.  Musculoskeletal: Normal range of motion.  Skin:    General: Skin is warm and dry.  Neurological:     Mental Status: She is alert and oriented to person, place, and time.      ED Treatments / Results  Labs (all labs ordered are listed, but only abnormal results are displayed) Labs Reviewed  BASIC METABOLIC PANEL  CBC WITH DIFFERENTIAL/PLATELET  I-STAT TROPONIN, ED    EKG EKG  Interpretation  Date/Time:  Tuesday July 11 2018 01:07:42 EST Ventricular Rate:  113 PR Interval:    QRS Duration: 93 QT Interval:  273 QTC Calculation: 375 R Axis:   -7 Text Interpretation:  Sinus tachycardia Ventricular trigeminy Borderline repolarization abnormality Baseline wander in lead(s) V5 Confirmed by Veryl Speak [phone]) on 07/11/2018 1:19:46 AM   Radiology No results found.  Procedures Procedures (including critical care time)  Medications Ordered in ED Medications  morphine 4 MG/ML injection 4 mg (has no administration in time range)     Initial Impression / Assessment and Plan / ED Course  I have reviewed the triage vital signs and the nursing notes.  Pertinent labs & imaging results that were available during my care of the patient were reviewed by me and considered in my medical decision making (see chart for details).  Patient with history of endometrial cancer presenting with complaints of chest pain.  Her work-up reveals no evidence for a cardiac etiology, however she did come back pancytopenic with platelet count of 4.  This was discussed with Dr. Olevia Bowens from the hospitalist service who will admit and transfuse platelets.  Patient to be admitted.  CRITICAL CARE Performed by: Veryl Speak Total critical care time: 45 minutes Critical care time was exclusive of separately billable procedures and treating other patients. Critical care was necessary to treat or prevent imminent or life-threatening deterioration. Critical care was time spent personally by me on the following activities: development of treatment plan with patient and/or surrogate as well as nursing, discussions with consultants, evaluation of patient's response to treatment, examination of patient, obtaining history from patient or surrogate, ordering and performing treatments and interventions, ordering and review of laboratory studies, ordering and review of radiographic studies, pulse oximetry  and re-evaluation of patient's condition.   Final Clinical Impressions(s) / ED Diagnoses   Final diagnoses:  None    ED Discharge Orders    None       Veryl Speak, MD 07/11/18 [phone]  Veryl Speak, MD 07/23/18 1106

## 2018-07-11 NOTE — H&P (Signed)
History and Physical    Mazella Deen HGD:[id number] DOB: [birth year] DOA: 07/11/2018  PCP: [name], MD   Patient coming from: Home.  I have personally briefly reviewed patient's old medical records in [place]  Chief Complaint: Chest pain.  HPI: Tom Ragsdale is a 68 y.o. female with medical history significant of anemia, asthma, chemotherapy induced pancytopenia, type 2 diabetes, ovarian cancer, endometrial cancer, hypertension, obesity, peripheral arterial disease, peripheral neuropathy, history of pneumonia, history of seizures presumptively due to hypomagnesemia who is coming to the emergency department due to precordial chest pain that started yesterday evening around 1900 and hour after she had chicken soup for dinner.  She describes the pain as burning in nature radiated to her mid chest, without any exacerbating or relieving factors, associated with mild dyspnea, but denies dizziness, nausea, emesis, diaphoresis or palpitations.  She denies PND, orthopnea or recent pitting edema of the lower extremities.  She denies recent constipation, diarrhea, melena or hematochezia.  No dysuria, frequency or hematuria.  Denies polyuria, polydipsia, polyphagia or blurred vision.  ED Course: Initial vital signs in the emergency department were temperature 97.7 F, pulse 109, respirations 20, blood pressure 161/97 mmHg and O2 sat 99% on room air. The patient received morphine 4 mg IVP x1 in the emergency department.  3 hours later, I ordered hydromorphone 1 mg IVP while she was still in the ER.  CBC shows a white count of 2.3, hemoglobin 7.4 g/dL and platelets 4.  PT 12.7, INR 0.96 and PTT 22.  Fibrinogen was 580 mg/dL.  D-dimer was 1.50 mcg/mL-FEU.  Glucose 286, BUN 34, creatinine 1.50, calcium 12.1 and magnesium 1.3 mg/dL.  All other electrolytes are within normal limits.  LFTs show hypoalbuminemia 3.0 g/dL, but all other hepatic functions are normal.  Her chest radiograph shows known  pulmonary nodules and streaky left basilar atelectasis.  Review of Systems: As per HPI otherwise 10 point review of systems negative.   Past Medical History:  Diagnosis Date  . Anemia   . Asthma   . Chemotherapy induced neutropenia ([place])   . Chemotherapy induced thrombocytopenia   . Diabetes mellitus without complication ([place])   . Endometrial cancer ([place])   . Hot flashes   . Hypertension   . Malignant neoplasm ([place])   . Malignant neoplasm of ovary ([place]) 04/30/2013  . Obesity   . Ovarian cancer ([place])   . Pancytopenia ([place])   . Peripheral artery disease ([place])   . Peripheral neuropathy   . Pneumonia 2015  . Port-A-Cath in place   . Seizures ([place]) 09/08/2016   ?? due to low magnesium    Past Surgical History:  Procedure Laterality Date  . ABDOMINAL HYSTERECTOMY  2014     reports that she has never smoked. She has never used smokeless tobacco. She reports that she does not drink alcohol or use drugs.  Allergies  Allergen Reactions  . Diphenhydramine Palpitations  . Iodinated Diagnostic Agents Other (See Comments)    Unknown  . Duloxetine Hcl Nausea And Vomiting  . Ibuprofen Other (See Comments)    Makes my bones hurt  . Penicillins Rash    Has patient had a PCN reaction causing immediate rash, facial/tongue/throat swelling, SOB or lightheadedness with hypotension: No Has patient had a PCN reaction causing severe rash involving mucus membranes or skin necrosis: No Has patient had a PCN reaction that required hospitalization: No Has patient had a PCN reaction occurring within the last 10 years: No If all of the  above answers are "NO", then may proceed with Cephalosporin use.     Family History  Problem Relation Age of Onset  . Heart failure Mother   . Stroke Sister   . Cancer Sister   . Diabetes Sister   . Leukemia Sister   . Diabetes Brother    Prior to Admission medications   Medication Sig Start Date End Date Taking? Authorizing Provider  acetaminophen  (TYLENOL) 500 MG tablet Take 500 mg by mouth every 6 (six) hours as needed for mild pain or moderate pain.     [provider]  aspirin EC 325 MG tablet Take 325 mg by mouth daily.  09/15/15   [provider]  calcium carbonate (TUMS EX) 750 MG chewable tablet Chew 1 tablet by mouth daily as needed for heartburn.    [provider]  calcium carbonate (TUMS SMOOTHIES) 750 MG chewable tablet Chew 1 tablet by mouth daily as needed for heartburn.    [provider]  CARBOPLATIN IV Inject into the vein every 21 ( twenty-one) days.    [provider]  cetirizine (ZYRTEC) 10 MG tablet Take 10 mg by mouth daily.     [provider]  Cholecalciferol (VITAMIN D PO) Take 1 tablet by mouth daily.    [provider]  cyanocobalamin 1000 MCG tablet Take 1,000 mcg by mouth daily.    [provider]  cyclobenzaprine (FLEXERIL) 10 MG tablet Take 10 mg by mouth 3 (three) times daily as needed for muscle spasms.    [provider]  furosemide (LASIX) 20 MG tablet Take 1 tablet (20 mg total) by mouth daily. 01/24/18   Orson Eva, MD  glipiZIDE (GLUCOTROL) 10 MG tablet TAKE 1 TABLET BY MOUTH TWICE A DAY 01/23/16   [provider]  levETIRAcetam (KEPPRA) 500 MG tablet Take 1 tablet (500 mg total) by mouth 2 (two) times daily. 12/01/17   Higgs, Mathis Dad, MD  lidocaine-prilocaine (EMLA) cream Apply small amount to port site one hour prior to appointment. Cover with plastic wrap Patient not taking: Reported on 06/22/2018 06/12/18   Derek Jack, MD  loperamide (IMODIUM) 2 MG capsule Take 2 mg by mouth as needed for diarrhea or loose stools.     [provider]  metFORMIN (GLUCOPHAGE) 1000 MG tablet Take 1,000 mg by mouth 2 (two) times daily with a meal.     [provider]  Omega-3 Fatty Acids (FISH OIL) 1000 MG CAPS Take 1,000 mg by mouth daily.     [provider]  oxyCODONE (OXY IR/ROXICODONE) 5 MG  immediate release tablet Take 1 tablet (5 mg total) by mouth every 6 (six) hours as needed for severe pain. 06/30/18   Higgs, Mathis Dad, MD  oxyCODONE-acetaminophen (PERCOCET) 10-325 MG tablet Take 1 tablet by mouth every 4 (four) hours as needed for pain. 06/23/18   Glennie Isle, NP-C  Polyethylene Glycol 3350 (MIRALAX PO) Take 17 g by mouth daily as needed for moderate constipation.     [provider]  prochlorperazine (COMPAZINE) 10 MG tablet Take 1 tablet (10 mg total) by mouth every 6 (six) hours as needed for nausea or vomiting. 05/02/18   Derek Jack, MD  simethicone (GAS-X) 80 MG chewable tablet Chew 80 mg by mouth every 6 (six) hours as needed for flatulence.    [provider]    Physical Exam: Vitals:   07/11/18 0230 07/11/18 0241 07/11/18 0300 07/11/18 0330  BP: (!) 168/69 (!) 191/77 (!) 165/96 (!) 177/75  Pulse:  (!) 117 98 (!) 106  Resp: 17 17 13  (!) 22  Temp:      TempSrc:      SpO2:  96% 98% 97%  Weight:      Height:        Constitutional: Looks chronically ill, but in NAD at this time. Eyes: PERRL, lids and conjunctivae are pale. ENMT: Mucous membranes are moist. No palate petechiae.  Posterior pharynx clear of any exudate or lesions. Neck: normal, supple, no masses, no thyromegaly Respiratory: clear to auscultation bilaterally, no wheezing, no crackles. Normal respiratory effort. No accessory muscle use.  Cardiovascular: Regular rate and rhythm, no murmurs / rubs / gallops. No extremity edema. 2+ pedal pulses. No carotid bruits.  Abdomen: Obese, soft, mild epigastric tenderness, no guarding or rebound, no masses palpated. No hepatosplenomegaly. Bowel sounds positive.  Musculoskeletal: no clubbing / cyanosis.  Good ROM, no contractures. Normal muscle tone.  Skin: No petechiae or ecchymosis seen on gross dermatological exam exam.  Hyperkeratosis of plantar and dorsal aspects of feet. Neurologic: CN 2-12 grossly intact. Sensation intact, DTR  normal. Strength 5/5 in all 4.  Psychiatric: Normal judgment and insight. Alert and oriented x 3. Normal mood.   Labs on Admission: I have personally reviewed following labs and imaging studies  CBC: Recent Labs  Lab 07/11/18 0200  WBC 2.3*  NEUTROABS 1.4*  HGB 7.4*  HCT 23.6*  MCV 97.5  PLT 4*   Basic Metabolic Panel: Recent Labs  Lab 07/11/18 0200  NA 135  K 4.0  CL 101  CO2 27  GLUCOSE 286*  BUN 34*  CREATININE 1.50*  CALCIUM 12.1*  MG 1.3*  PHOS 2.6   GFR: Estimated Creatinine Clearance: 44.3 mL/min (A) (by C-G formula based on SCr of 1.5 mg/dL (H)). Liver Function Tests: Recent Labs  Lab 07/11/18 0200  AST 21  ALT 16  ALKPHOS 54  BILITOT 0.6  PROT 6.5  ALBUMIN 3.0*   No results for input(s): LIPASE, AMYLASE in the last 168 hours. No results for input(s): AMMONIA in the last 168 hours. Coagulation Profile: Recent Labs  Lab 07/11/18 0200  INR 0.96   Cardiac Enzymes: No results for input(s): CKTOTAL, CKMB, CKMBINDEX, TROPONINI in the last 168 hours. BNP (last 3 results) No results for input(s): PROBNP in the last 8760 hours. HbA1C: No results for input(s): HGBA1C in the last 72 hours. CBG: No results for input(s): GLUCAP in the last 168 hours. Lipid Profile: No results for input(s): CHOL, HDL, LDLCALC, TRIG, CHOLHDL, LDLDIRECT in the last 72 hours. Thyroid Function Tests: No results for input(s): TSH, T4TOTAL, FREET4, T3FREE, THYROIDAB in the last 72 hours. Anemia Panel: No results for input(s): VITAMINB12, FOLATE, FERRITIN, TIBC, IRON, RETICCTPCT in the last 72 hours. Urine analysis:    Component Value Date/Time   COLORURINE STRAW (A) 02/18/2018 1539   APPEARANCEUR CLEAR 02/18/2018 1539   LABSPEC 1.009 02/18/2018 1539   PHURINE 7.0 02/18/2018 1539   GLUCOSEU NEGATIVE 02/18/2018 1539   HGBUR NEGATIVE 02/18/2018 1539   BILIRUBINUR NEGATIVE 02/18/2018 1539   KETONESUR NEGATIVE 02/18/2018 1539   PROTEINUR 30 (A) 02/18/2018 1539   NITRITE  NEGATIVE 02/18/2018 1539   LEUKOCYTESUR NEGATIVE 02/18/2018 1539    Radiological Exams on Admission: Dg Chest 2 View  Result Date: 07/11/2018 CLINICAL DATA:  Chest pain. Shortness of breath and lower extremity swelling. Chemotherapy 3 weeks prior 4 recurrent ovarian/uterine cancer with lung metastasis. EXAM: CHEST - 2 VIEW COMPARISON:  Chest CT 06/05/2018 FINDINGS: Low lung  volumes. Right chest port with tip in the mid SVC. Chronic elevation of left hemidiaphragm. Unchanged heart size and mediastinal contours. Minor streaky left lung base atelectasis. No pulmonary edema, focal airspace disease, pleural effusion or pneumothorax. Pulmonary nodules on CT are not well demonstrated radiographically. No acute osseous abnormalities. IMPRESSION: 1. Low lung volumes with streaky left basilar atelectasis. 2. Known pulmonary nodules are not well demonstrated radiographically. Electronically Signed   By: Keith Rake M.D.   On: 07/11/2018 02:54    EKG: Independently reviewed.  Vent. rate 113 BPM PR interval * ms QRS duration 93 ms QT/QTc 273/375 ms P-R-T axes 46 -7 116 Sinus tachycardia Ventricular trigeminy Borderline repolarization abnormality Baseline wander in lead(s) V5  Assessment/Plan Principal Problem:   Chest pain Seems to be atypical and GI in etiology. Observation/telemetry. Continue supplemental oxygen. Analgesics as needed. No aspirin due to thrombocytopenia/GI. Start low-dose metoprolol.  Active Problems:   Chemotherapy-induced thrombocytopenia Transfuse platelets. Follow-up platelet count. Consult hematology later today.    Acquired pancytopenia ([place]) As above. Follow-up CBC.    Type 2 diabetes mellitus (HCC) Carbohydrate modified diet. CBG monitoring with regular insulin sliding scale.    Hypomagnesemia Replacing. Follow-up magnesium level as needed.    Atelectasis left lung base Incentive spirometer at several times a day.   DVT prophylaxis: SCDs. Code  Status: Full code. Family Communication:  Disposition Plan: Observation for chest pain and platelet transfusion. Consults called: Routine oncology counseling a.m. Admission status: Observation/telemetry.   Reubin Milan MD [facility]  If 7PM-7AM, please contact night-coverage [email or web address] Password [facility]  07/11/2018, 3:54 AM

## 2018-07-11 NOTE — Care Management Obs Status (Signed)
[place] NOTIFICATION   Patient Details  Name: Kathleen Whitehead MRN: [id number] Date of Birth: 02/12/1951   Medicare Observation Status Notification Given:  Yes    Ariea Rochin, Chauncey Reading, RN 07/11/2018, 11:23 AM

## 2018-07-11 NOTE — ED Triage Notes (Signed)
Pt c/o chest pain that started last night at 1900, initially thought it was indigestion. Pt also c/o SOB. Pt's last chemo treat ment was 3 weeks ago. Bilateral lower extremity swelling.

## 2018-07-12 DIAGNOSIS — Z888 Allergy status to other drugs, medicaments and biological substances status: Secondary | ICD-10-CM | POA: Diagnosis not present

## 2018-07-12 DIAGNOSIS — E876 Hypokalemia: Secondary | ICD-10-CM

## 2018-07-12 DIAGNOSIS — Z79899 Other long term (current) drug therapy: Secondary | ICD-10-CM | POA: Diagnosis not present

## 2018-07-12 DIAGNOSIS — E1151 Type 2 diabetes mellitus with diabetic peripheral angiopathy without gangrene: Secondary | ICD-10-CM | POA: Diagnosis present

## 2018-07-12 DIAGNOSIS — R451 Restlessness and agitation: Secondary | ICD-10-CM

## 2018-07-12 DIAGNOSIS — Z880 Allergy status to penicillin: Secondary | ICD-10-CM | POA: Diagnosis not present

## 2018-07-12 DIAGNOSIS — E669 Obesity, unspecified: Secondary | ICD-10-CM | POA: Diagnosis present

## 2018-07-12 DIAGNOSIS — E1165 Type 2 diabetes mellitus with hyperglycemia: Secondary | ICD-10-CM

## 2018-07-12 DIAGNOSIS — D61810 Antineoplastic chemotherapy induced pancytopenia: Secondary | ICD-10-CM | POA: Diagnosis present

## 2018-07-12 DIAGNOSIS — J9811 Atelectasis: Secondary | ICD-10-CM | POA: Diagnosis present

## 2018-07-12 DIAGNOSIS — I493 Ventricular premature depolarization: Secondary | ICD-10-CM | POA: Diagnosis present

## 2018-07-12 DIAGNOSIS — C7800 Secondary malignant neoplasm of unspecified lung: Secondary | ICD-10-CM | POA: Diagnosis present

## 2018-07-12 DIAGNOSIS — I10 Essential (primary) hypertension: Secondary | ICD-10-CM | POA: Diagnosis present

## 2018-07-12 DIAGNOSIS — C569 Malignant neoplasm of unspecified ovary: Secondary | ICD-10-CM | POA: Diagnosis present

## 2018-07-12 DIAGNOSIS — T451X5A Adverse effect of antineoplastic and immunosuppressive drugs, initial encounter: Secondary | ICD-10-CM | POA: Diagnosis present

## 2018-07-12 DIAGNOSIS — Z7982 Long term (current) use of aspirin: Secondary | ICD-10-CM | POA: Diagnosis not present

## 2018-07-12 DIAGNOSIS — C7982 Secondary malignant neoplasm of genital organs: Secondary | ICD-10-CM | POA: Diagnosis present

## 2018-07-12 DIAGNOSIS — Z6831 Body mass index (BMI) 31.0-31.9, adult: Secondary | ICD-10-CM | POA: Diagnosis not present

## 2018-07-12 DIAGNOSIS — J45909 Unspecified asthma, uncomplicated: Secondary | ICD-10-CM | POA: Diagnosis present

## 2018-07-12 DIAGNOSIS — Z7984 Long term (current) use of oral hypoglycemic drugs: Secondary | ICD-10-CM | POA: Diagnosis not present

## 2018-07-12 DIAGNOSIS — D6959 Other secondary thrombocytopenia: Secondary | ICD-10-CM | POA: Diagnosis present

## 2018-07-12 DIAGNOSIS — E1142 Type 2 diabetes mellitus with diabetic polyneuropathy: Secondary | ICD-10-CM | POA: Diagnosis present

## 2018-07-12 DIAGNOSIS — G9341 Metabolic encephalopathy: Secondary | ICD-10-CM | POA: Diagnosis present

## 2018-07-12 LAB — PREPARE PLATELET PHERESIS
UNIT DIVISION: 0
Unit division: 0
Unit division: 0
Unit division: 0

## 2018-07-12 LAB — CBC WITH DIFFERENTIAL/PLATELET
Abs Immature Granulocytes: 0.01 10*3/uL (ref 0.00–0.07)
Basophils Absolute: 0 10*3/uL (ref 0.0–0.1)
Basophils Relative: 0 %
Eosinophils Absolute: 0 10*3/uL (ref 0.0–0.5)
Eosinophils Relative: 0 %
HCT: 22.6 % — ABNORMAL LOW (ref 36.0–46.0)
Hemoglobin: 7.2 g/dL — ABNORMAL LOW (ref 12.0–15.0)
Immature Granulocytes: 0 %
LYMPHS ABS: 0.8 10*3/uL (ref 0.7–4.0)
Lymphocytes Relative: 35 %
MCH: 30.4 pg (ref 26.0–34.0)
MCHC: 31.9 g/dL (ref 30.0–36.0)
MCV: 95.4 fL (ref 80.0–100.0)
Monocytes Absolute: 0.2 10*3/uL (ref 0.1–1.0)
Monocytes Relative: 9 %
NRBC: 0 % (ref 0.0–0.2)
Neutro Abs: 1.3 10*3/uL — ABNORMAL LOW (ref 1.7–7.7)
Neutrophils Relative %: 56 %
Platelets: 69 10*3/uL — ABNORMAL LOW (ref 150–400)
RBC: 2.37 MIL/uL — ABNORMAL LOW (ref 3.87–5.11)
RDW: 13.9 % (ref 11.5–15.5)
WBC: 2.4 10*3/uL — ABNORMAL LOW (ref 4.0–10.5)

## 2018-07-12 LAB — BPAM PLATELET PHERESIS
BLOOD PRODUCT EXPIRATION DATE: 202001092359
Blood Product Expiration Date: 202001072359
Blood Product Expiration Date: 202001082359
Blood Product Expiration Date: 202001092359
ISSUE DATE / TIME: 202001070530
ISSUE DATE / TIME: 202001070647
ISSUE DATE / TIME: 202001071214
ISSUE DATE / TIME: 202001071430
Unit Type and Rh: 600
Unit Type and Rh: 6200
Unit Type and Rh: 6200
Unit Type and Rh: 6200

## 2018-07-12 LAB — MAGNESIUM: Magnesium: 1.4 mg/dL — ABNORMAL LOW (ref 1.7–2.4)

## 2018-07-12 LAB — LIPID PANEL
Cholesterol: 146 mg/dL (ref 0–200)
HDL: 50 mg/dL (ref >40)
LDL Cholesterol: 73 mg/dL (ref 0–99)
Total CHOL/HDL Ratio: 2.9 RATIO
Triglycerides: 117 mg/dL (ref <150)
VLDL: 23 mg/dL (ref 0–40)

## 2018-07-12 LAB — GLUCOSE, CAPILLARY
GLUCOSE-CAPILLARY: 114 mg/dL — AB (ref 70–99)
Glucose-Capillary: 146 mg/dL — ABNORMAL HIGH (ref 70–99)
Glucose-Capillary: 138 mg/dL — ABNORMAL HIGH (ref 70–99)
Glucose-Capillary: 121 mg/dL — ABNORMAL HIGH (ref 70–99)

## 2018-07-12 LAB — BASIC METABOLIC PANEL
Anion gap: 8 (ref 5–15)
BUN: 28 mg/dL — ABNORMAL HIGH (ref 8–23)
CALCIUM: 10.3 mg/dL (ref 8.9–10.3)
CHLORIDE: 104 mmol/L (ref 98–111)
CO2: 27 mmol/L (ref 22–32)
CREATININE: 1.22 mg/dL — AB (ref 0.44–1.00)
GFR calc Af Amer: 53 mL/min — ABNORMAL LOW (ref >60)
GFR calc non Af Amer: 45 mL/min — ABNORMAL LOW (ref >60)
Glucose, Bld: 176 mg/dL — ABNORMAL HIGH (ref 70–99)
Potassium: 3.2 mmol/L — ABNORMAL LOW (ref 3.5–5.1)
Sodium: 139 mmol/L (ref 135–145)

## 2018-07-12 LAB — PARATHYROID HORMONE, INTACT (NO CA): PTH: 12 pg/mL — ABNORMAL LOW (ref 15–65)

## 2018-07-12 MED ORDER — LEVETIRACETAM 500 MG PO TABS
500.0000 mg | ORAL_TABLET | Freq: Two times a day (BID) | ORAL | Status: DC
  Administered 2018-07-12 – 2018-07-13 (×2): 500 mg via ORAL
  Filled 2018-07-12 (×2): qty 1

## 2018-07-12 MED ORDER — FUROSEMIDE 10 MG/ML IJ SOLN
20.0000 mg | Freq: Once | INTRAMUSCULAR | Status: AC
  Administered 2018-07-12: 20 mg via INTRAVENOUS
  Filled 2018-07-12: qty 2

## 2018-07-12 MED ORDER — LORAZEPAM 2 MG/ML IJ SOLN: 0.5000 mg | INTRAMUSCULAR | Status: DC | PRN

## 2018-07-12 MED ORDER — MAGNESIUM SULFATE 2 GM/50ML IV SOLN
2.0000 g | Freq: Once | INTRAVENOUS | Status: AC
  Administered 2018-07-12: 2 g via INTRAVENOUS
  Filled 2018-07-12: qty 50

## 2018-07-12 MED ORDER — POTASSIUM CHLORIDE CRYS ER 20 MEQ PO TBCR
40.0000 meq | EXTENDED_RELEASE_TABLET | Freq: Once | ORAL | Status: AC
  Administered 2018-07-12: 40 meq via ORAL
  Filled 2018-07-12: qty 2

## 2018-07-12 MED ORDER — SODIUM CHLORIDE 0.9% IV SOLUTION
Freq: Once | INTRAVENOUS | Status: AC
  Administered 2018-07-12: 17:00:00 via INTRAVENOUS

## 2018-07-12 NOTE — Progress Notes (Addendum)
PROGRESS NOTE    Kathleen Whitehead  BJS:[id number] DOB: 07-29-1950 DOA: 07/11/2018 PCP: [name], MD     Brief Narrative:  68 y.o. female with medical history significant of anemia, asthma, chemotherapy induced pancytopenia, type 2 diabetes, ovarian cancer, endometrial cancer, hypertension, obesity, peripheral arterial disease, peripheral neuropathy, history of pneumonia, history of seizures presumptively due to hypomagnesemia who is coming to the emergency department due to precordial chest pain that started yesterday evening around 1900 and hour after she had chicken soup for dinner.  She describes the pain as burning in nature radiated to her mid chest, without any exacerbating or relieving factors, associated with mild dyspnea, but denies dizziness, nausea, emesis, diaphoresis or palpitations.  She denies PND, orthopnea or recent pitting edema of the lower extremities.  She denies recent constipation, diarrhea, melena or hematochezia.  No dysuria, frequency or hematuria.  Denies polyuria, polydipsia, polyphagia or blurred vision.   Assessment & Plan: 1-chest pain -Resolve and most likely associated with symptomatic anemia -Negative troponin -Reassuring 2D echo -Per cardiology no further inpatient work-up anticipated -Will discontinue telemetry.  2-acquired pancytopenia ([place]) -In the setting of chemotherapy use -Platelets improved to 69,000 after 4 units of platelets transfused -1 unit of PRBCs given hemoglobin at 7.2; after discussing with oncology we will give an extra unit of PRBCs -No active bleeding appreciated. -Continue avoiding heparin products and using SCDs for DVT prophylaxis.  3-Type 2 diabetes mellitus (HCC) -Continue sliding scale insulin.  4-hypokalemia/hypomagnesemia -Repleted -Follow electrolytes trend.  5-hypertension -Continue metoprolol.  6-history of seizure -Resume the use of Keppra.  7-metastatic ovarian cancer -Poor response to  chemotherapy -Overall poor prognosis -Discussed with oncology service who has recommended palliative care and based on patient's decisions pursuit of hospice.  8-acute metabolic encephalopathy  -mentation not back to baseline  -symptomatic anemia (due to decrease in brain oxygenation) and medications like analgesics and benzodiazepines contributing. -will minimize sedative drugs -continue transfusion -follow clinical response    DVT prophylaxis: SCDs Code Status: Full code Family Communication: Husband at bedside Disposition Plan: Hemoglobin continue to be low at 7.2; will go ahead and transfuse 1 more unit of PRBCs.  After discussing with oncology service palliative care will be involved and further discussion for goals of care and advance directives will be entertained.  To minimize as much as possible the use of medications that can intervene with her mentation.  Consultants:   Oncology  Cardiology  Palliative care  Procedures:   2D echo: No wall motion and normalities, preserved ejection fraction.  Antimicrobials:  Anti-infectives (From admission, onward)   None     Subjective: Afebrile, denies chest pain.  Sleepy and somnolent mittens in place.  Per nurses had required the use of IV benzodiazepines to calm her down as she was very agitated and physically aggressive against staff.  Objective: Vitals:   07/12/18 0613 07/12/18 1419 07/12/18 1659 07/12/18 1723  BP: (!) 133/45 (!) 145/61 (!) 131/56 111/80  Pulse: 69 66 80 73  Resp: 18 16 18 18   Temp: 98.5 F (36.9 C) 97.7 F (36.5 C) 98.5 F (36.9 C) 98.3 F (36.8 C)  TempSrc: Oral Oral Oral Oral  SpO2: 98%  98% 98%  Weight:      Height:        Intake/Output Summary (Last 24 hours) at 07/12/2018 1755 Last data filed at 07/12/2018 1723 Gross per 24 hour  Intake 941.3 ml  Output 700 ml  Net 241.3 ml   Filed Weights   07/11/18 0106  Weight: 96.2 kg    Examination: General exam: Somnolent, sleepy and having  difficulty following commands at this time.  Per nursing reports patient has been physically aggressive and agitated throughout the night and ended receiving benzodiazepine early this morning to calm her down. Respiratory system: Clear to auscultation bilaterally.  Normal respiratory effort. Cardiovascular system: RRR.  No rubs, no gallops, no JVD. Gastrointestinal system: Abdomen is soft, nontender, nondistended.  No masses has been felt on examination.  Positive bowel sounds. Central nervous system: Unable to fully assess as patient is somnolent and very sleepy after medication given for agitation.  She is able to move 4 limbs spontaneously.  Answering yes/no to simple questions. Extremities: No cyanosis, no clubbing, trace edema bilaterally. Skin: No rashes, no petechiae, no open wounds. Psychiatry: Unable to fully assess based on current mentation.  Data Reviewed: I have personally reviewed following labs and imaging studies  CBC: Recent Labs  Lab 07/11/18 0200 07/11/18 0902 07/12/18 0619  WBC 2.3* 2.9* 2.4*  NEUTROABS 1.4* 1.8 1.3*  HGB 7.4* 7.1* 7.2*  HCT 23.6* 22.4* 22.6*  MCV 97.5 98.7 95.4  PLT 4* 71* 69*   Basic Metabolic Panel: Recent Labs  Lab 07/11/18 0200 07/11/18 0902 07/12/18 0619  NA 135 138 139  K 4.0 3.8 3.2*  CL 101 103 104  CO2 27 26 27   GLUCOSE 286* 225* 176*  BUN 34* 33* 28*  CREATININE 1.50* 1.35* 1.22*  CALCIUM 12.1* 11.9* 10.3  MG 1.3*  --  1.4*  PHOS 2.6  --   --    GFR: Estimated Creatinine Clearance: 54.5 mL/min (A) (by C-G formula based on SCr of 1.22 mg/dL (H)).   Liver Function Tests: Recent Labs  Lab 07/11/18 0200  AST 21  ALT 16  ALKPHOS 54  BILITOT 0.6  PROT 6.5  ALBUMIN 3.0*   Coagulation Profile: Recent Labs  Lab 07/11/18 0200  INR 0.96   Cardiac Enzymes: Recent Labs  Lab 07/11/18 0902 07/11/18 1622  TROPONINI 0.27* 0.45*   CBG: Recent Labs  Lab 07/11/18 1202 07/11/18 2129 07/12/18 0730 07/12/18 1113  07/12/18 1617  GLUCAP 197* 141* 146* 138* 121*   Lipid Profile: Recent Labs    07/12/18 0619  CHOL 146  HDL 50  LDLCALC 73  TRIG 117  CHOLHDL 2.9   Urine analysis:    Component Value Date/Time   COLORURINE STRAW (A) 02/18/2018 1539   APPEARANCEUR CLEAR 02/18/2018 1539   LABSPEC 1.009 02/18/2018 1539   PHURINE 7.0 02/18/2018 1539   GLUCOSEU NEGATIVE 02/18/2018 1539   HGBUR NEGATIVE 02/18/2018 1539   BILIRUBINUR NEGATIVE 02/18/2018 1539   KETONESUR NEGATIVE 02/18/2018 1539   PROTEINUR 30 (A) 02/18/2018 1539   NITRITE NEGATIVE 02/18/2018 1539   LEUKOCYTESUR NEGATIVE 02/18/2018 1539   Radiology Studies: Dg Chest 2 View  Result Date: 07/11/2018 CLINICAL DATA:  Chest pain. Shortness of breath and lower extremity swelling. Chemotherapy 3 weeks prior 4 recurrent ovarian/uterine cancer with lung metastasis. EXAM: CHEST - 2 VIEW COMPARISON:  Chest CT 06/05/2018 FINDINGS: Low lung volumes. Right chest port with tip in the mid SVC. Chronic elevation of left hemidiaphragm. Unchanged heart size and mediastinal contours. Minor streaky left lung base atelectasis. No pulmonary edema, focal airspace disease, pleural effusion or pneumothorax. Pulmonary nodules on CT are not well demonstrated radiographically. No acute osseous abnormalities. IMPRESSION: 1. Low lung volumes with streaky left basilar atelectasis. 2. Known pulmonary nodules are not well demonstrated radiographically. Electronically Signed   By: Aurther Loft.D.  On: 07/11/2018 02:54   Scheduled Meds: . sodium chloride   Intravenous Once  . alum & mag hydroxide-simeth  30 mL Oral Once   And  . lidocaine  15 mL Oral Once  . feeding supplement (ENSURE ENLIVE)  237 mL Oral BID BM  . furosemide  20 mg Intravenous Once  . hyoscyamine  0.25 mg Sublingual Once  . insulin aspart  0-15 Units Subcutaneous TID WC  . metoprolol tartrate  12.5 mg Oral BID  . sucralfate  1 g Oral TID WC & HS   Continuous Infusions:   LOS: 0 days     Time spent: 30 minutes    Barton Dubois, MD [facility] Pager [phone]  If 7PM-7AM, please contact night-coverage [email or web address] Password [facility] 07/12/2018, 5:55 PM

## 2018-07-13 ENCOUNTER — Ambulatory Visit (HOSPITAL_COMMUNITY): Payer: Self-pay

## 2018-07-13 ENCOUNTER — Other Ambulatory Visit (HOSPITAL_COMMUNITY): Payer: Managed Care, Other (non HMO)

## 2018-07-13 ENCOUNTER — Ambulatory Visit (HOSPITAL_COMMUNITY): Payer: Self-pay | Admitting: Hematology

## 2018-07-13 DIAGNOSIS — D649 Anemia, unspecified: Secondary | ICD-10-CM

## 2018-07-13 DIAGNOSIS — G9341 Metabolic encephalopathy: Secondary | ICD-10-CM

## 2018-07-13 DIAGNOSIS — E876 Hypokalemia: Secondary | ICD-10-CM

## 2018-07-13 LAB — CBC WITH DIFFERENTIAL/PLATELET
ABS IMMATURE GRANULOCYTES: 0 10*3/uL (ref 0.00–0.07)
Basophils Absolute: 0 10*3/uL (ref 0.0–0.1)
Basophils Relative: 0 %
Eosinophils Absolute: 0 10*3/uL (ref 0.0–0.5)
Eosinophils Relative: 0 %
HCT: 28.3 % — ABNORMAL LOW (ref 36.0–46.0)
Hemoglobin: 9.4 g/dL — ABNORMAL LOW (ref 12.0–15.0)
Immature Granulocytes: 0 %
Lymphocytes Relative: 44 %
Lymphs Abs: 1.1 10*3/uL (ref 0.7–4.0)
MCH: 30.2 pg (ref 26.0–34.0)
MCHC: 33.2 g/dL (ref 30.0–36.0)
MCV: 91 fL (ref 80.0–100.0)
Monocytes Absolute: 0.2 10*3/uL (ref 0.1–1.0)
Monocytes Relative: 7 %
NEUTROS ABS: 1.2 10*3/uL — AB (ref 1.7–7.7)
Neutrophils Relative %: 49 %
Platelets: 47 10*3/uL — ABNORMAL LOW (ref 150–400)
RBC: 3.11 MIL/uL — AB (ref 3.87–5.11)
RDW: 14.8 % (ref 11.5–15.5)
WBC: 2.5 10*3/uL — ABNORMAL LOW (ref 4.0–10.5)
nRBC: 0 % (ref 0.0–0.2)

## 2018-07-13 LAB — BPAM RBC
Blood Product Expiration Date: 202002022359
Blood Product Expiration Date: 202002022359
ISSUE DATE / TIME: 202001072026
ISSUE DATE / TIME: 202001081700
UNIT TYPE AND RH: 6200
Unit Type and Rh: 6200

## 2018-07-13 LAB — TYPE AND SCREEN
ABO/RH(D): A POS
Antibody Screen: NEGATIVE
Unit division: 0
Unit division: 0

## 2018-07-13 LAB — GLUCOSE, CAPILLARY
GLUCOSE-CAPILLARY: 131 mg/dL — AB (ref 70–99)
Glucose-Capillary: 149 mg/dL — ABNORMAL HIGH (ref 70–99)

## 2018-07-13 LAB — HIV ANTIBODY (ROUTINE TESTING W REFLEX): HIV Screen 4th Generation wRfx: NONREACTIVE

## 2018-07-13 MED ORDER — SUCRALFATE 1 GM/10ML PO SUSP: 1.0000 g | Freq: Three times a day (TID) | ORAL | 0 refills | Status: DC

## 2018-07-13 MED ORDER — ASPIRIN EC 325 MG PO TBEC: 325.0000 mg | DELAYED_RELEASE_TABLET | Freq: Every day | ORAL | Status: DC

## 2018-07-13 MED ORDER — METOPROLOL TARTRATE 25 MG PO TABS: 12.5000 mg | ORAL_TABLET | Freq: Two times a day (BID) | ORAL | 0 refills | Status: DC

## 2018-07-13 NOTE — Discharge Summary (Addendum)
Physician Discharge Summary  Kathleen Whitehead VPX:[id number] DOB: 1951-05-03 DOA: 07/11/2018  PCP: [name], MD  Admit date: 07/11/2018 Discharge date: 07/13/2018  Time spent: 35 minutes  Recommendations for Outpatient Follow-up:  1. Repeat CBC with differential to follow pancytopenia 2. Please repeat BMET and Mg level to follow electrolytes and renal function.  3. Advance directives and [place] discussion recommended.   Discharge Diagnoses:  Principal Problem:   Acute chest pain Active Problems:   Acquired pancytopenia (HCC)   Type 2 diabetes mellitus (HCC)   Chemotherapy-induced thrombocytopenia   Hypomagnesemia   Thrombocytopenia (HCC)   Hypokalemia   Acute metabolic encephalopathy   Symptomatic anemia   Discharge Condition: stable and improved. Discharge home with instructions to follow up with PCP and oncology service at discharge.  Diet recommendation: modified carb and low sodium diet.   Filed Weights   07/11/18 0106  Weight: 96.2 kg    History of present illness:  As per H&P written by Dr. Olevia Bowens on 07/11/2018 68 y.o. female with medical history significant of anemia, asthma, chemotherapy induced pancytopenia, type 2 diabetes, ovarian cancer, endometrial cancer, hypertension, obesity, peripheral arterial disease, peripheral neuropathy, history of pneumonia, history of seizures presumptively due to hypomagnesemia who is coming to the emergency department due to precordial chest pain that started yesterday evening around 1900 and hour after she had chicken soup for dinner.  She describes the pain as burning in nature radiated to her mid chest, without any exacerbating or relieving factors, associated with mild dyspnea, but denies dizziness, nausea, emesis, diaphoresis or palpitations.  She denies PND, orthopnea or recent pitting edema of the lower extremities.  She denies recent constipation, diarrhea, melena or hematochezia.  No dysuria, frequency or hematuria.  Denies  polyuria, polydipsia, polyphagia or blurred vision.  ED Course: Initial vital signs in the emergency department were temperature 97.7 F, pulse 109, respirations 20, blood pressure 161/97 mmHg and O2 sat 99% on room air. The patient received morphine 4 mg IVP x1 in the emergency department.  3 hours later, I ordered hydromorphone 1 mg IVP while she was still in the ER.  Hospital Course:  1-chest pain -Resolve and most likely associated with symptomatic anemia -Negative troponin -Reassuring 2D echo -Per cardiology no further inpatient work-up anticipated  2-acquired pancytopenia ([place]) -In the setting of chemotherapy use -Platelets improved to 47,000-69,000 after 4 units of platelets transfused -2 unit of PRBCs given hemoglobin at 9.4 at discharge -No active bleeding appreciated. -repeat CBC with diff at follow visit to evaluate pancytopenia status.  3-Type 2 diabetes mellitus ([place]) -resume home hypoglycemic regimen.  4-hypokalemia/hypomagnesemia -Repleted -Follow electrolytes trend at follow up visit.  5-hypertension -will discharge on metoprolol. -lisinopril and lasix discontinued -low sodium diet and adequate hydration recommended.  6-history of seizure -Resume the use of Keppra. -no active seizure appreciated  7-metastatic ovarian cancer -Poor response to chemotherapy -Overall poor prognosis -Discussed with oncology service who has recommended no further chemotherapy at this point and will discussed at follow up visit hospice treatment.  8-metabolic encephalopathy: -in the setting of symptomatic anemia, medications and hospital acquired delirium. -resolved by time of discharge. -oriented X 3 and demonstrating good insight. -Hgb stabilized and above 9 range currently.  Procedures:  See below for x-ray reports.   Consultations:  Cardiology  Oncology  Discharge Exam: Vitals:   07/12/18 2115 07/13/18 0551  BP: 136/72 (!) 164/68  Pulse: 75 74  Resp: 20  18  Temp: 98.3 F (36.8 C) 98.7 F (37.1 C)  SpO2: 98% 95%   General exam: afebrile, alert, oriented X3 and able to properly follow commands. Denies CP and SOB. Respiratory system: Clear to auscultation bilaterally.  Normal respiratory effort. Cardiovascular system: RRR.  No rubs, no gallops, no JVD. Gastrointestinal system: Abdomen is soft, nontender, nondistended.  No masses has been felt on examination.  Positive bowel sounds. Central nervous system: alert/oriented. No focal deficits appreciated. Extremities: No cyanosis, no clubbing, trace edema bilaterally. Skin: No rashes, no petechiae,  Psychiatry: good insight and currently oriented X3. Flat affect, but no SI or hallucinations.   Discharge Instructions   Discharge Instructions    Diet - low sodium heart healthy   Complete by:  As directed    Discharge instructions   Complete by:  As directed    Keep yourself well hydrated Take medications as prescribed  Avoid the use of NSAID's Please follow up with oncology in 1 week. Follow up with PCP in 2 weeks.     Allergies as of 07/13/2018      Reactions   Diphenhydramine Palpitations   Iodinated Diagnostic Agents Other (See Comments)   Unknown   Duloxetine Hcl Nausea And Vomiting   Ibuprofen Other (See Comments)   Makes my bones hurt   Penicillins Rash   Has patient had a PCN reaction causing immediate rash, facial/tongue/throat swelling, SOB or lightheadedness with hypotension: No Has patient had a PCN reaction causing severe rash involving mucus membranes or skin necrosis: No Has patient had a PCN reaction that required hospitalization: No Has patient had a PCN reaction occurring within the last 10 years: No If all of the above answers are "NO", then may proceed with Cephalosporin use.      Medication List    STOP taking these medications   anastrozole 1 MG tablet Commonly known as:  ARIMIDEX   CARBOPLATIN IV   furosemide 20 MG tablet Commonly known as:  LASIX    lisinopril 40 MG tablet Commonly known as:  PRINIVIL,ZESTRIL     TAKE these medications   acetaminophen 500 MG tablet Commonly known as:  TYLENOL Take 500 mg by mouth every 6 (six) hours as needed for mild pain or moderate pain.   aspirin EC 325 MG tablet Take 1 tablet (325 mg total) by mouth daily. Hold until follow up with oncology service. What changed:  additional instructions   cetirizine 10 MG tablet Commonly known as:  ZYRTEC Take 10 mg by mouth daily.   cyanocobalamin 1000 MCG tablet Take 1,000 mcg by mouth daily.   cyclobenzaprine 10 MG tablet Commonly known as:  FLEXERIL Take 10 mg by mouth 3 (three) times daily as needed for muscle spasms.   Fish Oil 1000 MG Caps Take 1,000 mg by mouth daily.   GAS-X 80 MG chewable tablet Generic drug:  simethicone Chew 80 mg by mouth every 6 (six) hours as needed for flatulence.   glipiZIDE 10 MG tablet Commonly known as:  GLUCOTROL TAKE 1 TABLET BY MOUTH TWICE A DAY   levETIRAcetam 500 MG tablet Commonly known as:  KEPPRA Take 1 tablet (500 mg total) by mouth 2 (two) times daily.   lidocaine-prilocaine cream Commonly known as:  EMLA Apply small amount to port site one hour prior to appointment. Cover with plastic wrap   loperamide 2 MG capsule Commonly known as:  IMODIUM Take 2 mg by mouth as needed for diarrhea or loose stools.   metFORMIN 1000 MG tablet Commonly known as:  GLUCOPHAGE Take 1,000 mg by mouth 2 (  two) times daily with a meal.   metoprolol tartrate 25 MG tablet Commonly known as:  LOPRESSOR Take 0.5 tablets (12.5 mg total) by mouth 2 (two) times daily.   MIRALAX PO Take 17 g by mouth daily as needed for moderate constipation.   ondansetron 4 MG disintegrating tablet Commonly known as:  ZOFRAN-ODT Take 4 mg by mouth every 6 (six) hours as needed for nausea or vomiting.   oxyCODONE 5 MG immediate release tablet Commonly known as:  Oxy IR/ROXICODONE Take 1 tablet (5 mg total) by mouth every 6  (six) hours as needed for severe pain.   oxyCODONE-acetaminophen 10-325 MG tablet Commonly known as:  PERCOCET Take 1 tablet by mouth every 4 (four) hours as needed for pain.   prochlorperazine 10 MG tablet Commonly known as:  COMPAZINE Take 1 tablet (10 mg total) by mouth every 6 (six) hours as needed for nausea or vomiting.   sucralfate 1 GM/10ML suspension Commonly known as:  CARAFATE Take 10 mLs (1 g total) by mouth 3 (three) times daily with meals.   TUMS SMOOTHIES 750 MG chewable tablet Generic drug:  calcium carbonate Chew 1 tablet by mouth daily as needed for heartburn.   VITAMIN D PO Take 1 tablet by mouth daily.      Allergies  Allergen Reactions  . Diphenhydramine Palpitations  . Iodinated Diagnostic Agents Other (See Comments)    Unknown  . Duloxetine Hcl Nausea And Vomiting  . Ibuprofen Other (See Comments)    Makes my bones hurt  . Penicillins Rash    Has patient had a PCN reaction causing immediate rash, facial/tongue/throat swelling, SOB or lightheadedness with hypotension: No Has patient had a PCN reaction causing severe rash involving mucus membranes or skin necrosis: No Has patient had a PCN reaction that required hospitalization: No Has patient had a PCN reaction occurring within the last 10 years: No If all of the above answers are "NO", then may proceed with Cephalosporin use.    Follow-up Information    Burdine, Virgina Evener, MD. Schedule an appointment as soon as possible for a visit in 2 week(s).   Specialty:  Family Medicine Contact information: [place] [address] [phone]        Derek Jack, MD. Schedule an appointment as soon as possible for a visit in 1 week(s).   Specialty:  Hematology Contact information: [address] [place] [address] [phone]           The results of significant diagnostics from this hospitalization (including imaging, microbiology, ancillary and laboratory) are listed below for  reference.    Significant Diagnostic Studies: Dg Chest 2 View  Result Date: 07/11/2018 CLINICAL DATA:  Chest pain. Shortness of breath and lower extremity swelling. Chemotherapy 3 weeks prior 4 recurrent ovarian/uterine cancer with lung metastasis. EXAM: CHEST - 2 VIEW COMPARISON:  Chest CT 06/05/2018 FINDINGS: Low lung volumes. Right chest port with tip in the mid SVC. Chronic elevation of left hemidiaphragm. Unchanged heart size and mediastinal contours. Minor streaky left lung base atelectasis. No pulmonary edema, focal airspace disease, pleural effusion or pneumothorax. Pulmonary nodules on CT are not well demonstrated radiographically. No acute osseous abnormalities. IMPRESSION: 1. Low lung volumes with streaky left basilar atelectasis. 2. Known pulmonary nodules are not well demonstrated radiographically. Electronically Signed   By: Keith Rake M.D.   On: 07/11/2018 02:54   Labs: Basic Metabolic Panel: Recent Labs  Lab 07/11/18 0200 07/11/18 0902 07/12/18 0619  NA 135 138  139  K 4.0 3.8 3.2*  CL 101 103 104  CO2 27 26 27   GLUCOSE 286* 225* 176*  BUN 34* 33* 28*  CREATININE 1.50* 1.35* 1.22*  CALCIUM 12.1* 11.9* 10.3  MG 1.3*  --  1.4*  PHOS 2.6  --   --    Liver Function Tests: Recent Labs  Lab 07/11/18 0200  AST 21  ALT 16  ALKPHOS 54  BILITOT 0.6  PROT 6.5  ALBUMIN 3.0*   CBC: Recent Labs  Lab 07/11/18 0200 07/11/18 0902 07/12/18 0619 07/13/18 0637  WBC 2.3* 2.9* 2.4* 2.5*  NEUTROABS 1.4* 1.8 1.3* 1.2*  HGB 7.4* 7.1* 7.2* 9.4*  HCT 23.6* 22.4* 22.6* 28.3*  MCV 97.5 98.7 95.4 91.0  PLT 4* 71* 69* 47*   Cardiac Enzymes: Recent Labs  Lab 07/11/18 0902 07/11/18 1622  TROPONINI 0.27* 0.45*   BNP: BNP (last 3 results) Recent Labs    01/22/18 1416  BNP 68.0   CBG: Recent Labs  Lab 07/12/18 1113 07/12/18 1617 07/12/18 2117 07/13/18 0717 07/13/18 1107  GLUCAP 138* 121* 114* 131* 149*    Signed:  Barton Dubois MD.  [facility] 07/13/2018, 1:50 PM

## 2018-07-13 NOTE — Progress Notes (Signed)
PMT consult received and chart reviewed. Spoke with Dr. Dyann Kief, who had extensive conversation with patient and husband today regarding goals of care. Patient is resistant to hospice discussions and consideration of limitations to care. When medically stable, she will be discharged home and plans to f/u with oncology outpatient. No needs today per attending. PMT provider will be available 07/14/18 if patient/husband wanting to further discuss [place]. Thank you.   NO CHARGE  Ihor Dow, [place], FNP-C Palliative Medicine Team  Phone: [phone] Fax: [phone]

## 2018-07-13 NOTE — Progress Notes (Signed)
Removed right upper huber needle from chest port and covered with 2x2 gauze and tegaderm, patient tolerated well.  Reviewed AVS with patient and patient's husband, both verbalized understanding.  Patient transported home by her husband.

## 2018-07-17 ENCOUNTER — Ambulatory Visit (HOSPITAL_COMMUNITY): Payer: Managed Care, Other (non HMO) | Admitting: Hematology

## 2018-07-20 ENCOUNTER — Encounter (HOSPITAL_COMMUNITY): Payer: Self-pay | Admitting: Hematology

## 2018-07-20 ENCOUNTER — Inpatient Hospital Stay (HOSPITAL_COMMUNITY): Payer: Managed Care, Other (non HMO)

## 2018-07-20 ENCOUNTER — Inpatient Hospital Stay (HOSPITAL_COMMUNITY): Payer: Managed Care, Other (non HMO) | Attending: Hematology | Admitting: Hematology

## 2018-07-20 ENCOUNTER — Other Ambulatory Visit: Payer: Self-pay

## 2018-07-20 VITALS — BP 155/57 | HR 88 | Temp 98.6°F | Resp 17

## 2018-07-20 DIAGNOSIS — Z79811 Long term (current) use of aromatase inhibitors: Secondary | ICD-10-CM | POA: Insufficient documentation

## 2018-07-20 DIAGNOSIS — Z923 Personal history of irradiation: Secondary | ICD-10-CM | POA: Diagnosis not present

## 2018-07-20 DIAGNOSIS — I10 Essential (primary) hypertension: Secondary | ICD-10-CM | POA: Diagnosis not present

## 2018-07-20 DIAGNOSIS — C569 Malignant neoplasm of unspecified ovary: Secondary | ICD-10-CM

## 2018-07-20 DIAGNOSIS — Z7982 Long term (current) use of aspirin: Secondary | ICD-10-CM | POA: Insufficient documentation

## 2018-07-20 DIAGNOSIS — E119 Type 2 diabetes mellitus without complications: Secondary | ICD-10-CM | POA: Insufficient documentation

## 2018-07-20 DIAGNOSIS — Z79899 Other long term (current) drug therapy: Secondary | ICD-10-CM | POA: Diagnosis not present

## 2018-07-20 DIAGNOSIS — Z7984 Long term (current) use of oral hypoglycemic drugs: Secondary | ICD-10-CM | POA: Diagnosis not present

## 2018-07-20 DIAGNOSIS — Z9221 Personal history of antineoplastic chemotherapy: Secondary | ICD-10-CM | POA: Diagnosis not present

## 2018-07-20 DIAGNOSIS — E669 Obesity, unspecified: Secondary | ICD-10-CM | POA: Diagnosis not present

## 2018-07-20 DIAGNOSIS — R918 Other nonspecific abnormal finding of lung field: Secondary | ICD-10-CM | POA: Diagnosis not present

## 2018-07-20 LAB — COMPREHENSIVE METABOLIC PANEL
ALT: 12 U/L (ref 0–44)
AST: 17 U/L (ref 15–41)
Albumin: 3.1 g/dL — ABNORMAL LOW (ref 3.5–5.0)
Alkaline Phosphatase: 59 U/L (ref 38–126)
Anion gap: 9 (ref 5–15)
BILIRUBIN TOTAL: 0.5 mg/dL (ref 0.3–1.2)
BUN: 18 mg/dL (ref 8–23)
CALCIUM: 9.4 mg/dL (ref 8.9–10.3)
CO2: 26 mmol/L (ref 22–32)
Chloride: 105 mmol/L (ref 98–111)
Creatinine, Ser: 0.87 mg/dL (ref 0.44–1.00)
GFR calc Af Amer: >60 mL/min (ref >60)
Glucose, Bld: 218 mg/dL — ABNORMAL HIGH (ref 70–99)
Potassium: 3.7 mmol/L (ref 3.5–5.1)
Sodium: 140 mmol/L (ref 135–145)
TOTAL PROTEIN: 7.1 g/dL (ref 6.5–8.1)

## 2018-07-20 LAB — CBC WITH DIFFERENTIAL/PLATELET
Abs Immature Granulocytes: 0 10*3/uL (ref 0.00–0.07)
Basophils Absolute: 0 10*3/uL (ref 0.0–0.1)
Basophils Relative: 0 %
Eosinophils Absolute: 0 10*3/uL (ref 0.0–0.5)
Eosinophils Relative: 1 %
HCT: 30.4 % — ABNORMAL LOW (ref 36.0–46.0)
Hemoglobin: 9.5 g/dL — ABNORMAL LOW (ref 12.0–15.0)
IMMATURE GRANULOCYTES: 0 %
Lymphocytes Relative: 55 %
Lymphs Abs: 1 10*3/uL (ref 0.7–4.0)
MCH: 29.5 pg (ref 26.0–34.0)
MCHC: 31.3 g/dL (ref 30.0–36.0)
MCV: 94.4 fL (ref 80.0–100.0)
Monocytes Absolute: 0.2 10*3/uL (ref 0.1–1.0)
Monocytes Relative: 11 %
Neutro Abs: 0.6 10*3/uL — ABNORMAL LOW (ref 1.7–7.7)
Neutrophils Relative %: 33 %
PLATELETS: 36 10*3/uL — AB (ref 150–400)
RBC: 3.22 MIL/uL — ABNORMAL LOW (ref 3.87–5.11)
RDW: 13.7 % (ref 11.5–15.5)
WBC: 1.8 10*3/uL — ABNORMAL LOW (ref 4.0–10.5)
nRBC: 0 % (ref 0.0–0.2)

## 2018-07-20 MED ORDER — OXYCODONE HCL 5 MG PO TABS: 5.0000 mg | ORAL_TABLET | Freq: Four times a day (QID) | ORAL | 0 refills | Status: DC | PRN

## 2018-07-20 MED ORDER — OXYCODONE-ACETAMINOPHEN 10-325 MG PO TABS: 1.0000 | ORAL_TABLET | ORAL | 0 refills | Status: DC | PRN

## 2018-07-20 NOTE — Patient Instructions (Addendum)
[facility] Discharge Instructions  Follow up in 2 weeks with labs    Thank you for choosing [facility] to provide your oncology and hematology care.  To afford each patient quality time with our provider, please arrive at least 15 minutes before your scheduled appointment time.   If you have a lab appointment with the Cancer Center please come in thru the  Main Entrance and check in at the main information desk  You need to re-schedule your appointment should you arrive 10 or more minutes late.  We strive to give you quality time with our providers, and arriving late affects you and other patients whose appointments are after yours.  Also, if you no show three or more times for appointments you may be dismissed from the clinic at the providers discretion.     Again, thank you for choosing [facility].  Our hope is that these requests will decrease the amount of time that you wait before being seen by our physicians.       _____________________________________________________________  Should you have questions after your visit to [facility], please contact our office at [phone] between the hours of 8:00 a.m. and 4:30 p.m.  Voicemails left after 4:00 p.m. will not be returned until the following business day.  For prescription refill requests, have your pharmacy contact our office and allow 72 hours.    Cancer Center Support Programs:   > Cancer Support Group  2nd Tuesday of the month 1pm-2pm, Journey Room    

## 2018-07-20 NOTE — Assessment & Plan Note (Addendum)
1.  Recurrent ovarian cancer: - Status post TAH and BSO in 2014 followed by carboplatin and Taxol for 5 cycles, last cycle DC'd due to thrombocytopenia, followed by EBRT and brachii therapy. -PET imaging and December 2017 demonstrated progression, treated with systemic chemotherapy with carboplatin/gemcitabine from 08/03/2016 through April 2018, poorly tolerated secondary to cytopenias. - Foundation 1 results show MS stable, TMB intermediate, no other actionable mutations, PDL 1 of 0% - In May 2018, left supraclavicular lymph node biopsy consistent with metastatic adenocarcinoma, GYN primary, ER positive - Discussed the results of the PET CT scan dated 01/10/2018 which shows progression of disease, with a hypermetabolic left supraclavicular lymph node, 3 new lung nodules in both lungs, progression of retroperitoneal adenopathy. - Patient would like to have therapy for her cancer as long as she can tolerate it well.  Because of her severe neuropathy she would not be a candidate for most chemotherapeutic agents in this setting.  We can use single agent carboplatin as she is platinum sensitive.  Since her tumor is slowly growing, I have recommended antiestrogen therapy with anastrozole.  - She was started on anastrozole on 02/28/2018.  She is tolerating it very well. -We have discontinued lisinopril on 05/02/2018.  She is taking anastrozole continuously without missing doses. - I reviewed the results of the CT scan of the CAP dated 06/05/2018 which shows continued progression of the left supraclavicular lymph node, now measuring 3 cm, compared to 2.2 cm previously. - Bilateral pulmonary nodules are stable in the interval.  No new nodules seen.  Similar appearance of para-aortic retroperitoneal lymphadenopathy.  Stable 3.1 cm calcified mesenteric mass. - We also discussed the results of Ca1 25, which has increased to 592.  This was 332 when anastrozole was started. -Based on these findings, I have recommended  change in therapy.  Anastrozole is not working.  We will discontinue it. - Cycle 1 of carboplatin AUC 4 on 06/22/2018. -Patient was hospitalized from 07/11/2017 through 07/13/2017 for chest pains.  She was found to be severely pancytopenic with platelet count of 4 and required transfusion.  She also required PRBC transfusion. - Patient has been feeling better for the last 3 to 4 days. -We discussed her blood counts today.  Platelet count is 35.  White count is still low at 1.8.  She has prolonged immunosuppression 4 weeks after treatment. - I do not believe she is a candidate for any further aggressive therapies.  I have recommended hospice at this point. -Patient reports that she is not ready for hospice yet.  We will see her back in 2 weeks and repeat her blood counts. - She was sent home on Carafate after discharge.  Reportedly it was very expensive.  I have suggested her to take Maalox/Mylanta.  If it does not help, she will call us back.  2.  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 -She is taking Percocet 10 mg every 4 hours. -She takes half tablet of oxycodone 5 mg 3-4 times per day in between.  She has tried long-acting OxyContin before which did not help.

## 2018-07-20 NOTE — Progress Notes (Signed)
[address] [place], [place] [address]   CLINIC:  Medical Oncology/Hematology  PCP:  [name], MD [place] [id number] [phone]   REASON FOR VISIT: Follow-up for metastatic ovarian cancer  CURRENT THERAPY: Anastrozole.  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 [name] and associates, pathology could not be certain that these were not 2 separate primaries rather than one metastatic process from the uterus to the ovary.    08/15/2013 - 12/06/2013 Chemotherapy    Carboplatin/Taxol with dose reduction of 50% and Taxol due to grade 2+ peripheral neuropathy and gabapentin induced nightmare/hallucinations. 5 out of 6 cycles given with the final cycle being cancelled secondary to significant thrombocytopenia/intolerance.    09/10/2013 - 10/17/2013 Radiation Therapy    5040 cGy delivered by EBRT followed by intravaginal brachytherapy.    04/30/2014 PET scan    PET scan consistent with recurrent disease of either ovarian cancer or endometrial cancer.    06/03/2014 - 07/15/2014 Radiation Therapy    5040 cGy over 28 fractions delivered to the low periaortic area lymph node either metastatic endometrial or metastatic or ovarian cancer.    07/19/2014 Imaging    Imaging concerning for new mesenteric disease and subdiaphragmatic disease.    08/05/2014 PET scan    No evidence of progression of disease.    08/05/2014 Remission    Negative PET scan.    06/11/2016 PET scan    1. Hypermetabolic aortocaval and right common iliac adenopathy, maximum SUV 17.1, compatible with malignancy. 2. Calcified left mesenteric mass has only low-grade activity, maximum SUV 5.3, merit surveillance. 3. There several small calcifications along the omentum which are not currently metabolically active. 4. Along the scarring in operative site from prior  laparotomy there some faintly accentuated metabolic activity which is probably related to the original wound and unlikely to be from tumor. 5. Coronary, aortic arch, and branch vessel atherosclerotic vascular disease.    06/11/2016 Relapse/Recurrence    PET scan with aortocaval & right iliac adenopathy consistent with malignancy.     08/03/2016 -  Chemotherapy    Carboplatin Day 1/Gemzar Days 1&8 every 21 days     08/10/2016 Treatment Plan Change    Day 8 cycle 1 cancelled due to cytopenia    09/07/2016 Treatment Plan Change    Treatment delayed x 1 week due to thrombocytopenia.  Hypomagnesemia noted and replaced IV.    09/08/2016 - 09/12/2016 Hospital Admission    Admit date: 09/08/2016  Admission diagnosis: Seizure Additional comments: Intubated on 3/7 for airway protection and extubated on 09/09/2016.  Hospital course complicated by pancytopenia secondary to systemic chemotherapy.    09/08/2016 Imaging    EEG- This sedated EEG is abnormal due to diffuse slowing of the waking background.  Clinical Correlation of the above findings indicates diffuse cerebral dysfunction that is non-specific in etiology and can be seen with hypoxic/ischemic injury, toxic/metabolic encephalopathies, neurodegenerative disorders, or medication effect.  The absence of epileptiform discharges does not rule out a clinical diagnosis of epilepsy.  Clinical correlation is advised.    09/12/2016 Imaging    CT head- No metastatic disease or acute intracranial abnormality. Normal for age CT appearance of the brain.    09/21/2016 Treatment Plan Change    Carboplatin deleted from treatment plan.  Will pursue single-agent Gemcitabine at reduced dose.    11/19/2016 Relapse/Recurrence  Diagnosis Lymph node, needle/core biopsy, left supraclavicular METASTATIC ADENOCARCINOMA, CONSISTENT WITH GYNECOLOGICAL PRIMARY.    06/22/2018 -  Chemotherapy    The patient had palonosetron (ALOXI) injection 0.25 mg, 0.25 mg, Intravenous,   Once, 1 of 6 cycles Administration: 0.25 mg (06/22/2018) fosaprepitant (EMEND) 150 mg, dexamethasone (DECADRON) 12 mg in sodium chloride 0.9 % 145 mL IVPB, , Intravenous,  Once, 1 of 6 cycles Administration:  (06/22/2018) CARBOplatin (PARAPLATIN) 470 mg in sodium chloride 0.9 % 250 mL chemo infusion, 470 mg (100 % of original dose 468.5 mg), Intravenous,  Once, 1 of 6 cycles Dose modification:   (original dose 468.5 mg, Cycle 1) Administration: 470 mg (06/22/2018)  for chemotherapy treatment.       INTERVAL HISTORY:  Ms. Kathleen Whitehead 68 y.o. female returns for routine follow-up for metastatic ovarian cancer. She was discharged from the hospital on Thursday of last week. She is starting to feel a little better since. She is still very weak and unable to do any activities at home. She is still having a lot of pain even when she takes her pain medication. Denies any vomiting or diarrhea. Denies any new pains. Had not noticed any recent bleeding such as epistaxis, hematuria or hematochezia. Denies recent chest pain on exertion, shortness of breath on minimal exertion, pre-syncopal episodes, or palpitations. Denies any numbness or tingling in hands or feet. Denies any recent fevers, infections, or recent hospitalizations. Patient reports appetite at 50% and energy level at 25%.   REVIEW OF SYSTEMS:  Review of Systems  Cardiovascular: Positive for leg swelling.  Gastrointestinal: Positive for nausea.  All other systems reviewed and are negative.    PAST MEDICAL/SURGICAL HISTORY:  Past Medical History:  Diagnosis Date  . Anemia   . Asthma   . Chemotherapy induced neutropenia ([place])   . Chemotherapy induced thrombocytopenia   . Diabetes mellitus without complication ([place])   . Endometrial cancer ([place])   . Hot flashes   . Hypertension   . Malignant neoplasm ([place])   . Malignant neoplasm of ovary ([place]) 04/30/2013  . Obesity   . Ovarian cancer ([place])   . Pancytopenia ([place])   . Peripheral artery  disease ([place])   . Peripheral neuropathy   . Pneumonia 2015  . Port-A-Cath in place   . Seizures ([place]) 09/08/2016   ?? due to low magnesium   Past Surgical History:  Procedure Laterality Date  . ABDOMINAL HYSTERECTOMY  2014     SOCIAL HISTORY:  Social History   Socioeconomic History  . Marital status: Married    Spouse name: Not on file  . Number of children: Not on file  . Years of education: Not on file  . Highest education level: Not on file  Occupational History  . Not on file  Social Needs  . Financial resource strain: Not on file  . Food insecurity:    Worry: Not on file    Inability: Not on file  . Transportation needs:    Medical: Not on file    Non-medical: Not on file  Tobacco Use  . Smoking status: Never Smoker  . Smokeless tobacco: Never Used  Substance and Sexual Activity  . Alcohol use: No  . Drug use: No  . Sexual activity: Not Currently  Lifestyle  . Physical activity:    Days per week: Not on file    Minutes per session: Not on file  . Stress: Not on file  Relationships  . Social connections:    Talks on phone:  Not on file    Gets together: Not on file    Attends religious service: Not on file    Active member of club or organization: Not on file    Attends meetings of clubs or organizations: Not on file    Relationship status: Not on file  . Intimate partner violence:    Fear of current or ex partner: Not on file    Emotionally abused: Not on file    Physically abused: Not on file    Forced sexual activity: Not on file  Other Topics Concern  . Not on file  Social History Narrative  . Not on file    FAMILY HISTORY:  Family History  Problem Relation Age of Onset  . Heart failure Mother   . Stroke Sister   . Cancer Sister   . Diabetes Sister   . Leukemia Sister   . Diabetes Brother     CURRENT MEDICATIONS:  Outpatient Encounter Medications as of 07/20/2018  Medication Sig  . acetaminophen (TYLENOL) 500 MG tablet Take 500 mg by  mouth every 6 (six) hours as needed for mild pain or moderate pain.   . cetirizine (ZYRTEC) 10 MG tablet Take 10 mg by mouth daily.   . Cholecalciferol (VITAMIN D PO) Take 1 tablet by mouth daily.  . cyanocobalamin 1000 MCG tablet Take 1,000 mcg by mouth daily.  Marland Kitchen glipiZIDE (GLUCOTROL) 10 MG tablet TAKE 1 TABLET BY MOUTH TWICE A DAY  . levETIRAcetam (KEPPRA) 500 MG tablet Take 1 tablet (500 mg total) by mouth 2 (two) times daily.  Marland Kitchen lidocaine-prilocaine (EMLA) cream Apply small amount to port site one hour prior to appointment. Cover with plastic wrap  . metFORMIN (GLUCOPHAGE) 1000 MG tablet Take 1,000 mg by mouth 2 (two) times daily with a meal.   . metoprolol tartrate (LOPRESSOR) 25 MG tablet Take 0.5 tablets (12.5 mg total) by mouth 2 (two) times daily.  . Omega-3 Fatty Acids (FISH OIL) 1000 MG CAPS Take 1,000 mg by mouth daily.   . ondansetron (ZOFRAN-ODT) 4 MG disintegrating tablet Take 4 mg by mouth every 6 (six) hours as needed for nausea or vomiting.  Marland Kitchen oxyCODONE (OXY IR/ROXICODONE) 5 MG immediate release tablet Take 1 tablet (5 mg total) by mouth every 6 (six) hours as needed for severe pain.  Marland Kitchen oxyCODONE-acetaminophen (PERCOCET) 10-325 MG tablet Take 1 tablet by mouth every 4 (four) hours as needed for pain.  . Polyethylene Glycol 3350 (MIRALAX PO) Take 17 g by mouth daily as needed for moderate constipation.   . prochlorperazine (COMPAZINE) 10 MG tablet Take 1 tablet (10 mg total) by mouth every 6 (six) hours as needed for nausea or vomiting.  . simethicone (GAS-X) 80 MG chewable tablet Chew 80 mg by mouth every 6 (six) hours as needed for flatulence.  . sucralfate (CARAFATE) 1 GM/10ML suspension Take 10 mLs (1 g total) by mouth 3 (three) times daily with meals.  . calcium carbonate (TUMS SMOOTHIES) 750 MG chewable tablet Chew 1 tablet by mouth daily as needed for heartburn.  . loperamide (IMODIUM) 2 MG capsule Take 2 mg by mouth as needed for diarrhea or loose stools.   .  [DISCONTINUED] aspirin EC 325 MG tablet Take 1 tablet (325 mg total) by mouth daily. Hold until follow up with oncology service.  . [DISCONTINUED] cyclobenzaprine (FLEXERIL) 10 MG tablet Take 10 mg by mouth 3 (three) times daily as needed for muscle spasms.   No facility-administered encounter medications on file as of  07/20/2018.     ALLERGIES:  Allergies  Allergen Reactions  . Diphenhydramine Palpitations  . Iodinated Diagnostic Agents Other (See Comments)    Unknown  . Duloxetine Hcl Nausea And Vomiting  . Ibuprofen Other (See Comments)    Makes my bones hurt  . Penicillins Rash    Has patient had a PCN reaction causing immediate rash, facial/tongue/throat swelling, SOB or lightheadedness with hypotension: No Has patient had a PCN reaction causing severe rash involving mucus membranes or skin necrosis: No Has patient had a PCN reaction that required hospitalization: No Has patient had a PCN reaction occurring within the last 10 years: No If all of the above answers are "NO", then may proceed with Cephalosporin use.      PHYSICAL EXAM:  ECOG Performance status: 1  Vitals:   07/20/18 0900  BP: (!) 155/57  Pulse: 88  Resp: 17  Temp: 98.6 F (37 C)  SpO2: 100%   There were no vitals filed for this visit.  Physical Exam Constitutional:      Appearance: Normal appearance. She is normal weight.  Musculoskeletal:     Comments: Wheelchair bound  Skin:    General: Skin is warm and dry.  Neurological:     Mental Status: She is alert and oriented to person, place, and time. Mental status is at baseline.  Psychiatric:        Mood and Affect: Mood normal.        Behavior: Behavior normal.        Thought Content: Thought content normal.        Judgment: Judgment normal.    Extremities: 2+ edema bilateral.  LABORATORY DATA:  I have reviewed the labs as listed.  CBC    Component Value Date/Time   WBC 1.8 (L) 07/20/2018 0812   RBC 3.22 (L) 07/20/2018 0812   HGB 9.5 (L)  07/20/2018 0812   HCT 30.4 (L) 07/20/2018 0812   PLT 36 (L) 07/20/2018 0812   MCV 94.4 07/20/2018 0812   MCH 29.5 07/20/2018 0812   MCHC 31.3 07/20/2018 0812   RDW 13.7 07/20/2018 0812   LYMPHSABS 1.0 07/20/2018 0812   MONOABS 0.2 07/20/2018 0812   EOSABS 0.0 07/20/2018 0812   BASOSABS 0.0 07/20/2018 0812   CMP Latest Ref Rng & Units 07/20/2018 07/12/2018 07/11/2018  Glucose 70 - 99 mg/dL 218(H) 176(H) 225(H)  BUN 8 - 23 mg/dL 18 28(H) 33(H)  Creatinine 0.44 - 1.00 mg/dL 0.87 1.22(H) 1.35(H)  Sodium 135 - 145 mmol/L 140 139 138  Potassium 3.5 - 5.1 mmol/L 3.7 3.2(L) 3.8  Chloride 98 - 111 mmol/L 105 104 103  CO2 22 - 32 mmol/L '26 27 26  '$ Calcium 8.9 - 10.3 mg/dL 9.4 10.3 11.9(H)  Total Protein 6.5 - 8.1 g/dL 7.1 - -  Total Bilirubin 0.3 - 1.2 mg/dL 0.5 - -  Alkaline Phos 38 - 126 U/L 59 - -  AST 15 - 41 U/L 17 - -  ALT 0 - 44 U/L 12 - -       DIAGNOSTIC IMAGING:  I have independently reviewed the scans and discussed with the patient.   I have reviewed Francene Finders, NP's note and agree with the documentation.  I personally performed a face-to-face visit, made revisions and my assessment and plan is as follows.    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  Recurrent ovarian cancer: - Status post TAH and BSO in 2014 followed by carboplatin and Taxol for 5  cycles, last cycle DC'd due to thrombocytopenia, followed by EBRT and brachii therapy. -PET imaging and December 2017 demonstrated progression, treated with systemic chemotherapy with carboplatin/gemcitabine from 08/03/2016 through April 2018, poorly tolerated secondary to cytopenias. - Foundation 1 results show MS stable, TMB intermediate, no other actionable mutations, PDL 1 of 0% - In May 2018, left supraclavicular lymph node biopsy consistent with metastatic adenocarcinoma, GYN primary, ER positive - Discussed the results of the PET CT scan dated 01/10/2018 which shows progression of disease, with a  hypermetabolic left supraclavicular lymph node, 3 new lung nodules in both lungs, progression of retroperitoneal adenopathy. - Patient would like to have therapy for her cancer as long as she can tolerate it well.  Because of her severe neuropathy she would not be a candidate for most chemotherapeutic agents in this setting.  We can use single agent carboplatin as she is platinum sensitive.  Since her tumor is slowly growing, I have recommended antiestrogen therapy with anastrozole.  - She was started on anastrozole on 02/28/2018.  She is tolerating it very well. -We have discontinued lisinopril on 05/02/2018.  She is taking anastrozole continuously without missing doses. - I reviewed the results of the CT scan of the CAP dated 06/05/2018 which shows continued progression of the left supraclavicular lymph node, now measuring 3 cm, compared to 2.2 cm previously. - Bilateral pulmonary nodules are stable in the interval.  No new nodules seen.  Similar appearance of para-aortic retroperitoneal lymphadenopathy.  Stable 3.1 cm calcified mesenteric mass. - We also discussed the results of Ca1 25, which has increased to 592.  This was 332 when anastrozole was started. -Based on these findings, I have recommended change in therapy.  Anastrozole is not working.  We will discontinue it. - Cycle 1 of carboplatin AUC 4 on 06/22/2018. -Patient was hospitalized from 07/11/2017 through 07/13/2017 for chest pains.  She was found to be severely pancytopenic with platelet count of 4 and required transfusion.  She also required PRBC transfusion. - Patient has been feeling better for the last 3 to 4 days. -We discussed her blood counts today.  Platelet count is 35.  White count is still low at 1.8.  She has prolonged immunosuppression 4 weeks after treatment. - I do not believe she is a candidate for any further aggressive therapies.  I have recommended hospice at this point. -Patient reports that she is not ready for hospice  yet.  We will see her back in 2 weeks and repeat her blood counts. - She was sent home on Carafate after discharge.  Reportedly it was very expensive.  I have suggested her to take Maalox/Mylanta.  If it does not help, she will call us back.  2.  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 -She is taking Percocet 10 mg every 4 hours. -She takes half tablet of oxycodone 5 mg 3-4 times per day in between.  She has tried long-acting OxyContin before which did not help.       Orders placed this encounter:  Orders Placed This Encounter  Procedures  . CBC with Differential/Platelet  . Comprehensive metabolic panel      Derek Jack, MD [place] [phone]

## 2018-07-25 ENCOUNTER — Other Ambulatory Visit (HOSPITAL_COMMUNITY): Payer: Self-pay | Admitting: *Deleted

## 2018-08-03 ENCOUNTER — Encounter (HOSPITAL_COMMUNITY): Payer: Self-pay | Admitting: *Deleted

## 2018-08-03 ENCOUNTER — Other Ambulatory Visit: Payer: Self-pay

## 2018-08-03 ENCOUNTER — Encounter (HOSPITAL_COMMUNITY): Payer: Self-pay | Admitting: Emergency Medicine

## 2018-08-03 ENCOUNTER — Emergency Department (HOSPITAL_COMMUNITY)
Admission: EM | Admit: 2018-08-03 | Discharge: 2018-08-03 | Disposition: A | Discharge status: Home / Self Care | Payer: Medicare HMO | Attending: Emergency Medicine | Admitting: Emergency Medicine

## 2018-08-03 DIAGNOSIS — I10 Essential (primary) hypertension: Secondary | ICD-10-CM | POA: Insufficient documentation

## 2018-08-03 DIAGNOSIS — E1140 Type 2 diabetes mellitus with diabetic neuropathy, unspecified: Secondary | ICD-10-CM | POA: Diagnosis not present

## 2018-08-03 DIAGNOSIS — J45909 Unspecified asthma, uncomplicated: Secondary | ICD-10-CM | POA: Insufficient documentation

## 2018-08-03 DIAGNOSIS — A09 Infectious gastroenteritis and colitis, unspecified: Secondary | ICD-10-CM | POA: Diagnosis not present

## 2018-08-03 DIAGNOSIS — R197 Diarrhea, unspecified: Secondary | ICD-10-CM | POA: Diagnosis not present

## 2018-08-03 LAB — COMPREHENSIVE METABOLIC PANEL
ALT: 11 U/L (ref 0–44)
AST: 16 U/L (ref 15–41)
Albumin: 3.2 g/dL — ABNORMAL LOW (ref 3.5–5.0)
Alkaline Phosphatase: 74 U/L (ref 38–126)
Anion gap: 10 (ref 5–15)
BUN: 17 mg/dL (ref 8–23)
CO2: 27 mmol/L (ref 22–32)
Calcium: 9.5 mg/dL (ref 8.9–10.3)
Chloride: 98 mmol/L (ref 98–111)
Creatinine, Ser: 1.02 mg/dL — ABNORMAL HIGH (ref 0.44–1.00)
GFR calc Af Amer: >60 mL/min (ref >60)
GFR calc non Af Amer: 56 mL/min — ABNORMAL LOW (ref >60)
Glucose, Bld: 303 mg/dL — ABNORMAL HIGH (ref 70–99)
POTASSIUM: 4.3 mmol/L (ref 3.5–5.1)
SODIUM: 135 mmol/L (ref 135–145)
TOTAL PROTEIN: 7 g/dL (ref 6.5–8.1)
Total Bilirubin: 0.3 mg/dL (ref 0.3–1.2)

## 2018-08-03 LAB — URINALYSIS, ROUTINE W REFLEX MICROSCOPIC
Bacteria, UA: NONE SEEN
Bilirubin Urine: NEGATIVE
Glucose, UA: >=500 mg/dL — AB
HGB URINE DIPSTICK: NEGATIVE
Ketones, ur: NEGATIVE mg/dL
Leukocytes, UA: NEGATIVE
Nitrite: NEGATIVE
Protein, ur: 30 mg/dL — AB
Specific Gravity, Urine: 1.021 (ref 1.005–1.030)
pH: 7 (ref 5.0–8.0)

## 2018-08-03 LAB — TROPONIN I: Troponin I: <0.03 ng/mL (ref <0.03)

## 2018-08-03 LAB — CBC
HCT: 32 % — ABNORMAL LOW (ref 36.0–46.0)
Hemoglobin: 9.7 g/dL — ABNORMAL LOW (ref 12.0–15.0)
MCH: 30 pg (ref 26.0–34.0)
MCHC: 30.3 g/dL (ref 30.0–36.0)
MCV: 99.1 fL (ref 80.0–100.0)
Platelets: 202 10*3/uL (ref 150–400)
RBC: 3.23 MIL/uL — ABNORMAL LOW (ref 3.87–5.11)
RDW: 18 % — ABNORMAL HIGH (ref 11.5–15.5)
WBC: 8 10*3/uL (ref 4.0–10.5)
nRBC: 0.2 % (ref 0.0–0.2)

## 2018-08-03 LAB — LIPASE, BLOOD: Lipase: 19 U/L (ref 11–51)

## 2018-08-03 LAB — CBG MONITORING, ED: GLUCOSE-CAPILLARY: 310 mg/dL — AB (ref 70–99)

## 2018-08-03 LAB — LACTIC ACID, PLASMA: Lactic Acid, Venous: 2 mmol/L (ref 0.5–1.9)

## 2018-08-03 MED ORDER — SODIUM CHLORIDE 0.9 % IV BOLUS: 1000.0000 mL | Freq: Once | INTRAVENOUS | Status: DC

## 2018-08-03 MED ORDER — SODIUM CHLORIDE 0.9 % IV BOLUS
1000.0000 mL | Freq: Once | INTRAVENOUS | Status: AC
  Administered 2018-08-03: 1000 mL via INTRAVENOUS

## 2018-08-03 MED ORDER — ONDANSETRON HCL 4 MG/2ML IJ SOLN
4.0000 mg | Freq: Once | INTRAMUSCULAR | Status: AC
  Administered 2018-08-03: 4 mg via INTRAVENOUS
  Filled 2018-08-03: qty 2

## 2018-08-03 MED ORDER — FENTANYL CITRATE (PF) 100 MCG/2ML IJ SOLN
25.0000 ug | Freq: Once | INTRAMUSCULAR | Status: AC
  Administered 2018-08-03: 25 ug via INTRAVENOUS
  Filled 2018-08-03: qty 2

## 2018-08-03 NOTE — Progress Notes (Signed)
Patient called clinic asking to reschedule her appointment tomorrow due to her having excessive diarrhea and she doesn't want to come out.    I asked if she was taking imodium and she said yes "after every episode" .  I told her to change how she was taking it. She needs to take one imodium every 2 hours until she has gone 12 hours without a loose stool.  I told her if it goes into the night to take it every 4 hours through the night.  I told her to call the clinic and let us know if that got her diarrhea under control. She verbalizes understanding and intent to call tomorrow either way.

## 2018-08-03 NOTE — ED Provider Notes (Signed)
[facility] Emergency Department Provider Note MRN:  [id number]  Arrival date & time: 08/03/18     Chief Complaint   Diarrhea   History of Present Illness   Kathleen Whitehead is a 68 y.o. year-old female with a history of ovarian cancer, pancytopenia presenting to the ED with chief complaint of diarrhea.  6 days of persistent watery diarrhea, more recently general malaise, generalized weakness.  Denies fever, no nausea, no vomiting, no chest pain, no shortness of breath, no abdominal pain other than her chronic pain related to her ovarian cancer.  Symptoms are constant, not improved with Imodium at home.  Review of Systems  A complete 10 system review of systems was obtained and all systems are negative except as noted in the HPI and PMH.   Patient's Health History    Past Medical History:  Diagnosis Date  . Anemia   . Asthma   . Chemotherapy induced neutropenia ([place])   . Chemotherapy induced thrombocytopenia   . Diabetes mellitus without complication ([place])   . Endometrial cancer ([place])   . Hot flashes   . Hypertension   . Malignant neoplasm ([place])   . Malignant neoplasm of ovary ([place]) 04/30/2013  . Obesity   . Ovarian cancer ([place])   . Pancytopenia ([place])   . Peripheral artery disease ([place])   . Peripheral neuropathy   . Pneumonia 2015  . Port-A-Cath in place   . Seizures ([place]) 09/08/2016   ?? due to low magnesium    Past Surgical History:  Procedure Laterality Date  . ABDOMINAL HYSTERECTOMY  2014    Family History  Problem Relation Age of Onset  . Heart failure Mother   . Stroke Sister   . Cancer Sister   . Diabetes Sister   . Leukemia Sister   . Diabetes Brother     Social History   Socioeconomic History  . Marital status: Married    Spouse name: Not on file  . Number of children: Not on file  . Years of education: Not on file  . Highest education level: Not on file  Occupational History  . Not on file  Social Needs  . Financial resource  strain: Not on file  . Food insecurity:    Worry: Not on file    Inability: Not on file  . Transportation needs:    Medical: Not on file    Non-medical: Not on file  Tobacco Use  . Smoking status: Never Smoker  . Smokeless tobacco: Never Used  Substance and Sexual Activity  . Alcohol use: No  . Drug use: No  . Sexual activity: Not Currently  Lifestyle  . Physical activity:    Days per week: Not on file    Minutes per session: Not on file  . Stress: Not on file  Relationships  . Social connections:    Talks on phone: Not on file    Gets together: Not on file    Attends religious service: Not on file    Active member of club or organization: Not on file    Attends meetings of clubs or organizations: Not on file    Relationship status: Not on file  . Intimate partner violence:    Fear of current or ex partner: Not on file    Emotionally abused: Not on file    Physically abused: Not on file    Forced sexual activity: Not on file  Other Topics Concern  . Not on file  Social History  Narrative  . Not on file     Physical Exam  Vital Signs and Nursing Notes reviewed Vitals:   08/03/18 2030 08/03/18 2100  BP: (!) 157/59 (!) 159/59  Pulse:    Resp:    Temp:    SpO2:      CONSTITUTIONAL: Tired-appearing, NAD NEURO:  Alert and oriented x 3, no focal deficits EYES:  eyes equal and reactive ENT/NECK:  no LAD, no JVD CARDIO: Regular rate, well-perfused, normal S1 and S2 PULM:  CTAB no wheezing or rhonchi GI/GU:  normal bowel sounds, non-distended, non-tender MSK/SPINE:  No gross deformities, no edema SKIN:  no rash, atraumatic PSYCH:  Appropriate speech and behavior  Diagnostic and Interventional Summary    Labs Reviewed  CBC - Abnormal; Notable for the following components:      Result Value   RBC 3.23 (*)    Hemoglobin 9.7 (*)    HCT 32.0 (*)    RDW 18.0 (*)    All other components within normal limits  COMPREHENSIVE METABOLIC PANEL - Abnormal; Notable for the  following components:   Glucose, Bld 303 (*)    Creatinine, Ser 1.02 (*)    Albumin 3.2 (*)    GFR calc non Af Amer 56 (*)    All other components within normal limits  LACTIC ACID, PLASMA - Abnormal; Notable for the following components:   Lactic Acid, Venous 2.0 (*)    All other components within normal limits  URINALYSIS, ROUTINE W REFLEX MICROSCOPIC - Abnormal; Notable for the following components:   Glucose, UA >=500 (*)    Protein, ur 30 (*)    All other components within normal limits  CBG MONITORING, ED - Abnormal; Notable for the following components:   Glucose-Capillary 310 (*)    All other components within normal limits  LIPASE, BLOOD  TROPONIN I    No orders to display    Medications  sodium chloride 0.9 % bolus 1,000 mL (has no administration in time range)  ondansetron (ZOFRAN) injection 4 mg (4 mg Intravenous Given 08/03/18 1941)  fentaNYL (SUBLIMAZE) injection 25 mcg (25 mcg Intravenous Given 08/03/18 1940)  sodium chloride 0.9 % bolus 1,000 mL (0 mLs Intravenous Stopped 08/03/18 2139)     Procedures Critical Care  ED Course and Medical Decision Making  I have reviewed the triage vital signs and the nursing notes.  Pertinent labs & imaging results that were available during my care of the patient were reviewed by me and considered in my medical decision making (see below for details).  Persistent watery diarrhea in this 68 year old female with pancytopenia.  Reports shaking chills last night.  Vital signs normal, labs pending.  Labs largely unremarkable.  Minimally elevated lactate prior to IV fluids.  Upon reevaluation patient is feeling much better, ready for discharge.  Advised close follow-up with her regular doctors, replacement of fluids at home.  Strict return precautions for abdominal pain, fever.  After the discussed management above, the patient was determined to be safe for discharge.  The patient was in agreement with this plan and all questions  regarding their care were answered.  ED return precautions were discussed and the patient will return to the ED with any significant worsening of condition.  Barth Kirks. Sedonia Small, [place] [email or web address]  Final Clinical Impressions(s) / ED Diagnoses     ICD-10-CM   1. Diarrhea of presumed infectious origin R19.7     ED Discharge Orders  None         Maudie Flakes, MD 08/03/18 2148

## 2018-08-03 NOTE — ED Triage Notes (Signed)
Cancer patient, diarrhea x 1 week, called navigator today and was told to take imodium. Pt has taken it all day with out relief.   Complaining of Weakness, and heartburn

## 2018-08-03 NOTE — Discharge Instructions (Signed)
You were evaluated in the Emergency Department and after careful evaluation, we did not find any emergent condition requiring admission or further testing in the hospital.  Your symptoms today seem to be due to diarrhea caused by a virus.  Please continue to replace her fluids at home, follow-up closely with your regular doctors.  Please return to the Emergency Department if you experience any worsening of your condition.  We encourage you to follow up with a primary care provider.  Thank you for allowing [place] to be a part of your care.

## 2018-08-03 NOTE — ED Notes (Signed)
Date and time results received: 08/03/18 2110 (use smartphrase ".now" to insert current time)  Test: lactic acid Critical Value: 2.0  Name of Provider Notified: Dr. Sedonia Small  Orders Received? Or Actions Taken?: none at this time

## 2018-08-04 ENCOUNTER — Other Ambulatory Visit (HOSPITAL_COMMUNITY): Payer: Managed Care, Other (non HMO)

## 2018-08-04 ENCOUNTER — Ambulatory Visit (HOSPITAL_COMMUNITY): Payer: Managed Care, Other (non HMO) | Admitting: Hematology

## 2018-08-07 ENCOUNTER — Other Ambulatory Visit (HOSPITAL_COMMUNITY): Payer: Managed Care, Other (non HMO)

## 2018-08-07 ENCOUNTER — Ambulatory Visit (HOSPITAL_COMMUNITY): Payer: Managed Care, Other (non HMO) | Admitting: Hematology

## 2018-08-07 ENCOUNTER — Other Ambulatory Visit (HOSPITAL_COMMUNITY): Payer: Self-pay | Admitting: Nurse Practitioner

## 2018-08-07 DIAGNOSIS — C569 Malignant neoplasm of unspecified ovary: Secondary | ICD-10-CM

## 2018-08-07 DIAGNOSIS — R197 Diarrhea, unspecified: Secondary | ICD-10-CM

## 2018-08-07 MED ORDER — OXYCODONE HCL 5 MG PO TABS: 5.0000 mg | ORAL_TABLET | Freq: Four times a day (QID) | ORAL | 0 refills | Status: DC | PRN

## 2018-08-07 MED ORDER — DIPHENOXYLATE-ATROPINE 2.5-0.025 MG PO TABS: 1.0000 | ORAL_TABLET | Freq: Four times a day (QID) | ORAL | 0 refills | Status: DC | PRN

## 2018-08-10 ENCOUNTER — Inpatient Hospital Stay (HOSPITAL_COMMUNITY): Payer: Medicare HMO | Attending: Nurse Practitioner

## 2018-08-10 ENCOUNTER — Other Ambulatory Visit: Payer: Self-pay

## 2018-08-10 ENCOUNTER — Inpatient Hospital Stay (HOSPITAL_BASED_OUTPATIENT_CLINIC_OR_DEPARTMENT_OTHER): Payer: Medicare HMO | Admitting: Hematology

## 2018-08-10 ENCOUNTER — Encounter (HOSPITAL_COMMUNITY): Payer: Self-pay | Admitting: Hematology

## 2018-08-10 VITALS — BP 145/58 | HR 90 | Temp 98.7°F | Resp 18 | Wt 215.4 lb

## 2018-08-10 DIAGNOSIS — I10 Essential (primary) hypertension: Secondary | ICD-10-CM | POA: Insufficient documentation

## 2018-08-10 DIAGNOSIS — R197 Diarrhea, unspecified: Secondary | ICD-10-CM | POA: Diagnosis not present

## 2018-08-10 DIAGNOSIS — Z923 Personal history of irradiation: Secondary | ICD-10-CM | POA: Diagnosis not present

## 2018-08-10 DIAGNOSIS — G620 Drug-induced polyneuropathy: Secondary | ICD-10-CM

## 2018-08-10 DIAGNOSIS — C569 Malignant neoplasm of unspecified ovary: Secondary | ICD-10-CM

## 2018-08-10 DIAGNOSIS — Z79899 Other long term (current) drug therapy: Secondary | ICD-10-CM | POA: Insufficient documentation

## 2018-08-10 DIAGNOSIS — E669 Obesity, unspecified: Secondary | ICD-10-CM | POA: Insufficient documentation

## 2018-08-10 LAB — CBC WITH DIFFERENTIAL/PLATELET
Abs Immature Granulocytes: 0.04 10*3/uL (ref 0.00–0.07)
Basophils Absolute: 0 10*3/uL (ref 0.0–0.1)
Basophils Relative: 0 %
Eosinophils Absolute: 0 10*3/uL (ref 0.0–0.5)
Eosinophils Relative: 0 %
HCT: 33.2 % — ABNORMAL LOW (ref 36.0–46.0)
Hemoglobin: 10 g/dL — ABNORMAL LOW (ref 12.0–15.0)
Immature Granulocytes: 1 %
LYMPHS PCT: 16 %
Lymphs Abs: 1.3 10*3/uL (ref 0.7–4.0)
MCH: 30.5 pg (ref 26.0–34.0)
MCHC: 30.1 g/dL (ref 30.0–36.0)
MCV: 101.2 fL — ABNORMAL HIGH (ref 80.0–100.0)
Monocytes Absolute: 0.7 10*3/uL (ref 0.1–1.0)
Monocytes Relative: 9 %
Neutro Abs: 6.2 10*3/uL (ref 1.7–7.7)
Neutrophils Relative %: 74 %
Platelets: 209 10*3/uL (ref 150–400)
RBC: 3.28 MIL/uL — AB (ref 3.87–5.11)
RDW: 19.1 % — ABNORMAL HIGH (ref 11.5–15.5)
WBC: 8.3 10*3/uL (ref 4.0–10.5)
nRBC: 0 % (ref 0.0–0.2)

## 2018-08-10 LAB — COMPREHENSIVE METABOLIC PANEL
ALT: 10 U/L (ref 0–44)
ANION GAP: 8 (ref 5–15)
AST: 15 U/L (ref 15–41)
Albumin: 3.1 g/dL — ABNORMAL LOW (ref 3.5–5.0)
Alkaline Phosphatase: 79 U/L (ref 38–126)
BUN: 14 mg/dL (ref 8–23)
CO2: 27 mmol/L (ref 22–32)
Calcium: 9.9 mg/dL (ref 8.9–10.3)
Chloride: 100 mmol/L (ref 98–111)
Creatinine, Ser: 0.93 mg/dL (ref 0.44–1.00)
GFR calc Af Amer: >60 mL/min (ref >60)
GFR calc non Af Amer: >60 mL/min (ref >60)
Glucose, Bld: 153 mg/dL — ABNORMAL HIGH (ref 70–99)
Potassium: 4.4 mmol/L (ref 3.5–5.1)
Sodium: 135 mmol/L (ref 135–145)
Total Bilirubin: 0.3 mg/dL (ref 0.3–1.2)
Total Protein: 6.9 g/dL (ref 6.5–8.1)

## 2018-08-10 NOTE — Patient Instructions (Signed)
[facility] Discharge Instructions     Thank you for choosing [facility] to provide your oncology and hematology care.  To afford each patient quality time with our provider, please arrive at least 15 minutes before your scheduled appointment time.   If you have a lab appointment with the Cancer Center please come in thru the  Main Entrance and check in at the main information desk  You need to re-schedule your appointment should you arrive 10 or more minutes late.  We strive to give you quality time with our providers, and arriving late affects you and other patients whose appointments are after yours.  Also, if you no show three or more times for appointments you may be dismissed from the clinic at the providers discretion.     Again, thank you for choosing [facility].  Our hope is that these requests will decrease the amount of time that you wait before being seen by our physicians.       _____________________________________________________________  Should you have questions after your visit to [facility], please contact our office at [phone] between the hours of 8:00 a.m. and 4:30 p.m.  Voicemails left after 4:00 p.m. will not be returned until the following business day.  For prescription refill requests, have your pharmacy contact our office and allow 72 hours.    Cancer Center Support Programs:   > Cancer Support Group  2nd Tuesday of the month 1pm-2pm, Journey Room    

## 2018-08-10 NOTE — Progress Notes (Signed)
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 [name] and associates, pathology could not be certain that these were not 2 separate primaries rather than one metastatic process from the uterus to the ovary.    08/15/2013 - 12/06/2013 Chemotherapy    Carboplatin/Taxol with dose reduction of 50% and Taxol due to grade 2+ peripheral neuropathy and gabapentin induced nightmare/hallucinations. 5 out of 6 cycles given with the final cycle being cancelled secondary to significant thrombocytopenia/intolerance.    09/10/2013 - 10/17/2013 Radiation Therapy    5040 cGy delivered by EBRT followed by intravaginal brachytherapy.    04/30/2014 PET scan    PET scan consistent with recurrent disease of either ovarian cancer or endometrial cancer.    06/03/2014 - 07/15/2014 Radiation Therapy    5040 cGy over 28 fractions delivered to the low periaortic area lymph node either metastatic endometrial or metastatic or ovarian cancer.    07/19/2014 Imaging    Imaging concerning for new mesenteric disease and subdiaphragmatic disease.    08/05/2014 PET scan    No evidence of progression of disease.    08/05/2014 Remission    Negative PET scan.    06/11/2016 PET scan    1. Hypermetabolic aortocaval and right common iliac adenopathy, maximum SUV 17.1, compatible with malignancy. 2. Calcified left mesenteric mass has only low-grade activity, maximum SUV 5.3, merit surveillance. 3. There several small calcifications along the omentum which are not currently metabolically active. 4. Along the scarring in operative site from prior  laparotomy there some faintly accentuated metabolic activity which is probably related to the original wound and unlikely to be from tumor. 5. Coronary, aortic arch, and branch vessel atherosclerotic vascular disease.    06/11/2016 Relapse/Recurrence    PET scan with aortocaval & right iliac adenopathy consistent with malignancy.     08/03/2016 -  Chemotherapy    Carboplatin Day 1/Gemzar Days 1&8 every 21 days     08/10/2016 Treatment Plan Change    Day 8 cycle 1 cancelled due to cytopenia    09/07/2016 Treatment Plan Change    Treatment delayed x 1 week due to thrombocytopenia.  Hypomagnesemia noted and replaced IV.    09/08/2016 - 09/12/2016 Hospital Admission    Admit date: 09/08/2016  Admission diagnosis: Seizure Additional comments: Intubated on 3/7 for airway protection and extubated on 09/09/2016.  Hospital course complicated by pancytopenia secondary to systemic chemotherapy.    09/08/2016 Imaging    EEG- This sedated EEG is abnormal due to diffuse slowing of the waking background.  Clinical Correlation of the above findings indicates diffuse cerebral dysfunction that is non-specific in etiology and can be seen with hypoxic/ischemic injury, toxic/metabolic encephalopathies, neurodegenerative disorders, or medication effect.  The absence of epileptiform discharges does not rule out a clinical diagnosis of epilepsy.  Clinical correlation is advised.    09/12/2016 Imaging    CT head- No metastatic disease or acute intracranial abnormality. Normal for age CT appearance of the brain.    09/21/2016 Treatment Plan Change    Carboplatin deleted from treatment plan.  Will pursue single-agent Gemcitabine at reduced dose.    11/19/2016 Relapse/Recurrence  Diagnosis Lymph node, needle/core biopsy, left supraclavicular METASTATIC ADENOCARCINOMA, CONSISTENT WITH GYNECOLOGICAL PRIMARY.    06/22/2018 -  Chemotherapy    The patient had palonosetron (ALOXI) injection 0.25 mg, 0.25 mg, Intravenous,   Once, 1 of 6 cycles Administration: 0.25 mg (06/22/2018) fosaprepitant (EMEND) 150 mg, dexamethasone (DECADRON) 12 mg in sodium chloride 0.9 % 145 mL IVPB, , Intravenous,  Once, 1 of 6 cycles Administration:  (06/22/2018) CARBOplatin (PARAPLATIN) 470 mg in sodium chloride 0.9 % 250 mL chemo infusion, 470 mg (100 % of original dose 468.5 mg), Intravenous,  Once, 1 of 6 cycles Dose modification:   (original dose 468.5 mg, Cycle 1) Administration: 470 mg (06/22/2018)  for chemotherapy treatment.      INTERVAL HISTORY:  Kathleen Whitehead 67 y.o. female returns for routine follow-up for metastatic ovarian cancer. She is here today with her husband. She reports that she is having watery diarrhea daily. She is taking lomotil and it is not helping at all. She also has a decreased appetite due to nausea.  Denies any vomiting. Denies any new pains. Had not noticed any recent bleeding such as epistaxis, hematuria or hematochezia. Denies recent chest pain on exertion, shortness of breath on minimal exertion, pre-syncopal episodes, or palpitations. Denies any numbness or tingling in hands or feet. Denies any recent fevers, infections, or recent hospitalizations. Patient reports appetite at 50% and energy level at 25%.   REVIEW OF SYSTEMS:  Review of Systems  Constitutional: Positive for fatigue.  Gastrointestinal: Positive for diarrhea and nausea.  Musculoskeletal: Positive for myalgias.  Neurological: Positive for extremity weakness.  All other systems reviewed and are negative.    PAST MEDICAL/SURGICAL HISTORY:  Past Medical History:  Diagnosis Date  . Anemia   . Asthma   . Chemotherapy induced neutropenia (HCC)   . Chemotherapy induced thrombocytopenia   . Diabetes mellitus without complication (HCC)   . Endometrial cancer (HCC)   . Hot flashes   . Hypertension   . Malignant neoplasm (HCC)   . Malignant neoplasm of ovary (HCC) 04/30/2013  . Obesity   . Ovarian cancer (HCC)   . Pancytopenia  (HCC)   . Peripheral artery disease (HCC)   . Peripheral neuropathy   . Pneumonia 2015  . Port-A-Cath in place   . Seizures (HCC) 09/08/2016   ?? due to low magnesium   Past Surgical History:  Procedure Laterality Date  . ABDOMINAL HYSTERECTOMY  2014     SOCIAL HISTORY:  Social History   Socioeconomic History  . Marital status: Married    Spouse name: Not on file  . Number of children: Not on file  . Years of education: Not on file  . Highest education level: Not on file  Occupational History  . Not on file  Social Needs  . Financial resource strain: Not on file  . Food insecurity:    Worry: Not on file    Inability: Not on file  . Transportation needs:    Medical: Not on file    Non-medical: Not on file  Tobacco Use  . Smoking status: Never Smoker  . Smokeless tobacco: Never Used  Substance and Sexual Activity  . Alcohol use: No  . Drug use: No  . Sexual activity: Not Currently  Lifestyle  . Physical activity:    Days per week: Not on file    Minutes per session: Not on file  . Stress: Not on file  Relationships  . Social connections:    Talks on phone: Not on   file    Gets together: Not on file    Attends religious service: Not on file    Active member of club or organization: Not on file    Attends meetings of clubs or organizations: Not on file    Relationship status: Not on file  . Intimate partner violence:    Fear of current or ex partner: Not on file    Emotionally abused: Not on file    Physically abused: Not on file    Forced sexual activity: Not on file  Other Topics Concern  . Not on file  Social History Narrative  . Not on file    FAMILY HISTORY:  Family History  Problem Relation Age of Onset  . Heart failure Mother   . Stroke Sister   . Cancer Sister   . Diabetes Sister   . Leukemia Sister   . Diabetes Brother     CURRENT MEDICATIONS:  Outpatient Encounter Medications as of 08/10/2018  Medication Sig Note  . acetaminophen  (TYLENOL) 500 MG tablet Take 500 mg by mouth every 6 (six) hours as needed for mild pain or moderate pain.    . alum & mag hydroxide-simeth (MYLANTA) 200-200-20 MG/5ML suspension Take 30 mLs by mouth every 6 (six) hours as needed for indigestion, heartburn or flatulence.   . calcium carbonate (TUMS SMOOTHIES) 750 MG chewable tablet Chew 1 tablet by mouth daily as needed for heartburn.   . cetirizine (ZYRTEC) 10 MG tablet Take 10 mg by mouth daily.    . Cholecalciferol (VITAMIN D PO) Take 1 tablet by mouth daily.   . cyanocobalamin 1000 MCG tablet Take 1,000 mcg by mouth daily.   . diphenoxylate-atropine (LOMOTIL) 2.5-0.025 MG tablet Take 1 tablet by mouth 4 (four) times daily as needed for diarrhea or loose stools.   . glipiZIDE (GLUCOTROL) 10 MG tablet Take 10 mg by mouth 2 (two) times daily.    . levETIRAcetam (KEPPRA) 500 MG tablet Take 1 tablet (500 mg total) by mouth 2 (two) times daily.   . metFORMIN (GLUCOPHAGE) 1000 MG tablet Take 1,000 mg by mouth 2 (two) times daily with a meal.    . Omega-3 Fatty Acids (FISH OIL) 1000 MG CAPS Take 1,000 mg by mouth daily.    . ondansetron (ZOFRAN-ODT) 4 MG disintegrating tablet Take 4 mg by mouth every 6 (six) hours as needed for nausea or vomiting.   . oxyCODONE (OXY IR/ROXICODONE) 5 MG immediate release tablet Take 1 tablet (5 mg total) by mouth every 6 (six) hours as needed for severe pain.   . oxyCODONE-acetaminophen (PERCOCET) 10-325 MG tablet Take 1 tablet by mouth every 4 (four) hours as needed for pain.   . Polyethylene Glycol 3350 (MIRALAX PO) Take 17 g by mouth daily as needed for moderate constipation.    . simethicone (GAS-X) 80 MG chewable tablet Chew 80 mg by mouth every 6 (six) hours as needed for flatulence.   . [DISCONTINUED] metoprolol tartrate (LOPRESSOR) 25 MG tablet Take 0.5 tablets (12.5 mg total) by mouth 2 (two) times daily. 08/03/2018: Patient took one dose when prescribed and discontinued stating that she did not like how she felt    . [DISCONTINUED] sucralfate (CARAFATE) 1 GM/10ML suspension Take 10 mLs (1 g total) by mouth 3 (three) times daily with meals.   . loperamide (IMODIUM) 2 MG capsule Take 2 mg by mouth as needed for diarrhea or loose stools.    . prochlorperazine (COMPAZINE) 10 MG tablet Take 1 tablet (10 mg   total) by mouth every 6 (six) hours as needed for nausea or vomiting. (Patient not taking: Reported on 08/10/2018)    No facility-administered encounter medications on file as of 08/10/2018.     ALLERGIES:  Allergies  Allergen Reactions  . Diphenhydramine Palpitations  . Iodinated Diagnostic Agents Other (See Comments)    Unknown  . Duloxetine Hcl Nausea And Vomiting  . Ibuprofen Other (See Comments)    Makes my bones hurt  . Penicillins Rash    Has patient had a PCN reaction causing immediate rash, facial/tongue/throat swelling, SOB or lightheadedness with hypotension: No Has patient had a PCN reaction causing severe rash involving mucus membranes or skin necrosis: No Has patient had a PCN reaction that required hospitalization: No Has patient had a PCN reaction occurring within the last 10 years: No If all of the above answers are "NO", then may proceed with Cephalosporin use.      PHYSICAL EXAM:  ECOG Performance status: 1  Vitals:   08/10/18 1000  BP: (!) 145/58  Pulse: 90  Resp: 18  Temp: 98.7 F (37.1 C)  SpO2: 100%   Filed Weights   08/10/18 1000  Weight: 215 lb 6 oz (97.7 kg)    Physical Exam Constitutional:      Appearance: Normal appearance. She is normal weight.  Musculoskeletal: Normal range of motion.  Skin:    General: Skin is warm and dry.  Neurological:     Mental Status: She is alert and oriented to person, place, and time. Mental status is at baseline.  Psychiatric:        Mood and Affect: Mood normal.        Behavior: Behavior normal.        Thought Content: Thought content normal.        Judgment: Judgment normal.      LABORATORY DATA:  I have reviewed  the labs as listed.  CBC    Component Value Date/Time   WBC 8.3 08/10/2018 0935   RBC 3.28 (L) 08/10/2018 0935   HGB 10.0 (L) 08/10/2018 0935   HCT 33.2 (L) 08/10/2018 0935   PLT 209 08/10/2018 0935   MCV 101.2 (H) 08/10/2018 0935   MCH 30.5 08/10/2018 0935   MCHC 30.1 08/10/2018 0935   RDW 19.1 (H) 08/10/2018 0935   LYMPHSABS 1.3 08/10/2018 0935   MONOABS 0.7 08/10/2018 0935   EOSABS 0.0 08/10/2018 0935   BASOSABS 0.0 08/10/2018 0935   CMP Latest Ref Rng & Units 08/10/2018 08/03/2018 07/20/2018  Glucose 70 - 99 mg/dL 153(H) 303(H) 218(H)  BUN 8 - 23 mg/dL 14 17 18  Creatinine 0.44 - 1.00 mg/dL 0.93 1.02(H) 0.87  Sodium 135 - 145 mmol/L 135 135 140  Potassium 3.5 - 5.1 mmol/L 4.4 4.3 3.7  Chloride 98 - 111 mmol/L 100 98 105  CO2 22 - 32 mmol/L 27 27 26  Calcium 8.9 - 10.3 mg/dL 9.9 9.5 9.4  Total Protein 6.5 - 8.1 g/dL 6.9 7.0 7.1  Total Bilirubin 0.3 - 1.2 mg/dL 0.3 0.3 0.5  Alkaline Phos 38 - 126 U/L 79 74 59  AST 15 - 41 U/L 15 16 17  ALT 0 - 44 U/L 10 11 12       DIAGNOSTIC IMAGING:  I have independently reviewed the scans and discussed with the patient.   I have reviewed  , NP's note and agree with the documentation.  I personally performed a face-to-face visit, made revisions and my assessment and plan is as follows.      ASSESSMENT & PLAN:   Disseminated ovarian cancer ([place]) 1.  Recurrent ovarian cancer: - Status post TAH and BSO in 2014 followed by carboplatin and Taxol for 5 cycles, last cycle DC'd due to thrombocytopenia, followed by EBRT and brachii therapy. -PET imaging and December 2017 demonstrated progression, treated with systemic chemotherapy with carboplatin/gemcitabine from 08/03/2016 through April 2018, poorly tolerated secondary to cytopenias. - Foundation 1 results show MS stable, TMB intermediate, no other actionable mutations, PDL 1 of 0% - In May 2018, left supraclavicular lymph node biopsy consistent with metastatic adenocarcinoma,  GYN primary, ER positive - Discussed the results of the PET CT scan dated 01/10/2018 which shows progression of disease, with a hypermetabolic left supraclavicular lymph node, 3 new lung nodules in both lungs, progression of retroperitoneal adenopathy. - Patient would like to have therapy for her cancer as long as she can tolerate it well.  Because of her severe neuropathy she would not be a candidate for most chemotherapeutic agents in this setting.  We can use single agent carboplatin as she is platinum sensitive.  Since her tumor is slowly growing, I have recommended antiestrogen therapy with anastrozole.  - She was started on anastrozole on 02/28/2018.  She is tolerating it very well. -We have discontinued lisinopril on 05/02/2018.  She is taking anastrozole continuously without missing doses. - I reviewed the results of the CT scan of the CAP dated 06/05/2018 which shows continued progression of the left supraclavicular lymph node, now measuring 3 cm, compared to 2.2 cm previously. - Bilateral pulmonary nodules are stable in the interval.  No new nodules seen.  Similar appearance of para-aortic retroperitoneal lymphadenopathy.  Stable 3.1 cm calcified mesenteric mass. - We also discussed the results of Ca1 25, which has increased to 592.  This was 332 when anastrozole was started. -Based on these findings, I have recommended change in therapy.  Anastrozole is not working.  We will discontinue it. - Cycle 1 of carboplatin AUC 4 on 06/22/2018. -Patient was hospitalized from 07/11/2017 through 07/13/2017 for chest pains.  She was found to be severely pancytopenic with platelet count of 4 and required transfusion.  She also required PRBC transfusion. - At last visit I have recommended hospice as she is not able to tolerate any systemic therapy.  She told me that she is not ready for hospice yet. - She went to the ER on 08/03/2018 with diarrhea and dehydration.  She received IV fluids. -She is continuing to have  soft stools about 3-4 times per day despite taking 3-4 times of Lomotil per day. - We will send her stool for C. difficile and stool culture. -We discussed the results of the blood work which was grossly within normal limits.  2.  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  -She is taking Percocet 10 mg every 4 hours.  She takes half tablet of oxycodone 5 mg 3-4 times per day in between.  She has tried long-acting OxyContin on before which did not help.       Orders placed this encounter:  Orders Placed This Encounter  Procedures  . Stool culture  . CBC with Differential/Platelet  . Comprehensive metabolic panel      Derek Jack, MD [place] [phone]

## 2018-08-10 NOTE — Assessment & Plan Note (Signed)
1.  Recurrent ovarian cancer: - Status post TAH and BSO in 2014 followed by carboplatin and Taxol for 5 cycles, last cycle DC'd due to thrombocytopenia, followed by EBRT and brachii therapy. -PET imaging and December 2017 demonstrated progression, treated with systemic chemotherapy with carboplatin/gemcitabine from 08/03/2016 through April 2018, poorly tolerated secondary to cytopenias. - Foundation 1 results show MS stable, TMB intermediate, no other actionable mutations, PDL 1 of 0% - In May 2018, left supraclavicular lymph node biopsy consistent with metastatic adenocarcinoma, GYN primary, ER positive - Discussed the results of the PET CT scan dated 01/10/2018 which shows progression of disease, with a hypermetabolic left supraclavicular lymph node, 3 new lung nodules in both lungs, progression of retroperitoneal adenopathy. - Patient would like to have therapy for her cancer as long as she can tolerate it well.  Because of her severe neuropathy she would not be a candidate for most chemotherapeutic agents in this setting.  We can use single agent carboplatin as she is platinum sensitive.  Since her tumor is slowly growing, I have recommended antiestrogen therapy with anastrozole.  - She was started on anastrozole on 02/28/2018.  She is tolerating it very well. -We have discontinued lisinopril on 05/02/2018.  She is taking anastrozole continuously without missing doses. - I reviewed the results of the CT scan of the CAP dated 06/05/2018 which shows continued progression of the left supraclavicular lymph node, now measuring 3 cm, compared to 2.2 cm previously. - Bilateral pulmonary nodules are stable in the interval.  No new nodules seen.  Similar appearance of para-aortic retroperitoneal lymphadenopathy.  Stable 3.1 cm calcified mesenteric mass. - We also discussed the results of Ca1 25, which has increased to 592.  This was 332 when anastrozole was started. -Based on these findings, I have recommended  change in therapy.  Anastrozole is not working.  We will discontinue it. - Cycle 1 of carboplatin AUC 4 on 06/22/2018. -Patient was hospitalized from 07/11/2017 through 07/13/2017 for chest pains.  She was found to be severely pancytopenic with platelet count of 4 and required transfusion.  She also required PRBC transfusion. - At last visit I have recommended hospice as she is not able to tolerate any systemic therapy.  She told me that she is not ready for hospice yet. - She went to the ER on 08/03/2018 with diarrhea and dehydration.  She received IV fluids. -She is continuing to have soft stools about 3-4 times per day despite taking 3-4 times of Lomotil per day. - We will send her stool for C. difficile and stool culture. -We discussed the results of the blood work which was grossly within normal limits.  2.  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  -She is taking Percocet 10 mg every 4 hours.  She takes half tablet of oxycodone 5 mg 3-4 times per day in between.  She has tried long-acting OxyContin on before which did not help.

## 2018-08-12 DIAGNOSIS — C770 Secondary and unspecified malignant neoplasm of lymph nodes of head, face and neck: Secondary | ICD-10-CM | POA: Diagnosis not present

## 2018-08-12 DIAGNOSIS — R69 Illness, unspecified: Secondary | ICD-10-CM | POA: Diagnosis not present

## 2018-08-12 DIAGNOSIS — C7801 Secondary malignant neoplasm of right lung: Secondary | ICD-10-CM | POA: Diagnosis not present

## 2018-08-12 DIAGNOSIS — Z7984 Long term (current) use of oral hypoglycemic drugs: Secondary | ICD-10-CM | POA: Diagnosis not present

## 2018-08-12 DIAGNOSIS — R0600 Dyspnea, unspecified: Secondary | ICD-10-CM | POA: Diagnosis not present

## 2018-08-12 DIAGNOSIS — C569 Malignant neoplasm of unspecified ovary: Secondary | ICD-10-CM | POA: Diagnosis not present

## 2018-08-12 DIAGNOSIS — R52 Pain, unspecified: Secondary | ICD-10-CM | POA: Diagnosis not present

## 2018-08-12 DIAGNOSIS — R000 Tachycardia, unspecified: Secondary | ICD-10-CM | POA: Diagnosis not present

## 2018-08-12 DIAGNOSIS — Z8669 Personal history of other diseases of the nervous system and sense organs: Secondary | ICD-10-CM | POA: Diagnosis not present

## 2018-08-12 DIAGNOSIS — E119 Type 2 diabetes mellitus without complications: Secondary | ICD-10-CM | POA: Diagnosis not present

## 2018-08-12 DIAGNOSIS — C7802 Secondary malignant neoplasm of left lung: Secondary | ICD-10-CM | POA: Diagnosis not present

## 2018-08-12 DIAGNOSIS — R0602 Shortness of breath: Secondary | ICD-10-CM | POA: Diagnosis not present

## 2018-08-18 ENCOUNTER — Other Ambulatory Visit (HOSPITAL_COMMUNITY): Payer: Self-pay | Admitting: Nurse Practitioner

## 2018-08-18 ENCOUNTER — Other Ambulatory Visit (HOSPITAL_COMMUNITY): Payer: Self-pay | Admitting: *Deleted

## 2018-08-18 DIAGNOSIS — F419 Anxiety disorder, unspecified: Secondary | ICD-10-CM

## 2018-08-18 DIAGNOSIS — C569 Malignant neoplasm of unspecified ovary: Secondary | ICD-10-CM

## 2018-08-18 MED ORDER — OXYCODONE-ACETAMINOPHEN 10-325 MG PO TABS: 1.0000 | ORAL_TABLET | ORAL | 0 refills | Status: DC | PRN

## 2018-08-18 MED ORDER — ALPRAZOLAM 0.25 MG PO TABS: 0.2500 mg | ORAL_TABLET | Freq: Two times a day (BID) | ORAL | 0 refills | Status: AC | PRN

## 2018-08-21 ENCOUNTER — Other Ambulatory Visit (HOSPITAL_COMMUNITY): Payer: Self-pay | Admitting: Emergency Medicine

## 2018-08-21 ENCOUNTER — Other Ambulatory Visit (HOSPITAL_COMMUNITY): Payer: Self-pay | Admitting: Nurse Practitioner

## 2018-08-21 DIAGNOSIS — R197 Diarrhea, unspecified: Secondary | ICD-10-CM

## 2018-08-21 DIAGNOSIS — I10 Essential (primary) hypertension: Secondary | ICD-10-CM | POA: Diagnosis not present

## 2018-08-21 DIAGNOSIS — C569 Malignant neoplasm of unspecified ovary: Secondary | ICD-10-CM | POA: Diagnosis not present

## 2018-08-21 DIAGNOSIS — Z79899 Other long term (current) drug therapy: Secondary | ICD-10-CM | POA: Diagnosis not present

## 2018-08-21 DIAGNOSIS — G620 Drug-induced polyneuropathy: Secondary | ICD-10-CM | POA: Diagnosis not present

## 2018-08-21 DIAGNOSIS — Z923 Personal history of irradiation: Secondary | ICD-10-CM | POA: Diagnosis not present

## 2018-08-21 DIAGNOSIS — E669 Obesity, unspecified: Secondary | ICD-10-CM | POA: Diagnosis not present

## 2018-08-21 LAB — C DIFFICILE QUICK SCREEN W PCR REFLEX
C Diff antigen: NEGATIVE
C Diff interpretation: NOT DETECTED
C Diff toxin: NEGATIVE

## 2018-08-23 ENCOUNTER — Other Ambulatory Visit (HOSPITAL_COMMUNITY): Payer: Self-pay | Admitting: *Deleted

## 2018-08-23 DIAGNOSIS — C569 Malignant neoplasm of unspecified ovary: Secondary | ICD-10-CM

## 2018-08-23 MED ORDER — OXYCODONE HCL 5 MG PO TABS: 5.0000 mg | ORAL_TABLET | Freq: Four times a day (QID) | ORAL | 0 refills | Status: DC | PRN

## 2018-08-25 LAB — STOOL CULTURE REFLEX - CMPCXR

## 2018-08-25 LAB — STOOL CULTURE REFLEX - RSASHR

## 2018-08-25 LAB — STOOL CULTURE: E coli, Shiga toxin Assay: NEGATIVE

## 2018-08-28 ENCOUNTER — Other Ambulatory Visit (HOSPITAL_COMMUNITY): Payer: Self-pay | Admitting: *Deleted

## 2018-08-28 MED ORDER — ONDANSETRON 8 MG PO TBDP: 4.0000 mg | ORAL_TABLET | Freq: Four times a day (QID) | ORAL | 1 refills | Status: DC | PRN

## 2018-09-06 ENCOUNTER — Other Ambulatory Visit (HOSPITAL_COMMUNITY): Payer: Self-pay | Admitting: *Deleted

## 2018-09-06 DIAGNOSIS — C569 Malignant neoplasm of unspecified ovary: Secondary | ICD-10-CM

## 2018-09-06 MED ORDER — OXYCODONE HCL 5 MG PO TABS: 5.0000 mg | ORAL_TABLET | Freq: Four times a day (QID) | ORAL | 0 refills | Status: DC | PRN

## 2018-09-08 ENCOUNTER — Other Ambulatory Visit (HOSPITAL_COMMUNITY): Payer: Managed Care, Other (non HMO)

## 2018-09-08 ENCOUNTER — Ambulatory Visit (HOSPITAL_COMMUNITY): Payer: Managed Care, Other (non HMO) | Admitting: Hematology

## 2018-09-13 ENCOUNTER — Other Ambulatory Visit (HOSPITAL_COMMUNITY): Payer: Self-pay | Admitting: *Deleted

## 2018-09-13 DIAGNOSIS — C569 Malignant neoplasm of unspecified ovary: Secondary | ICD-10-CM

## 2018-09-13 MED ORDER — OXYCODONE-ACETAMINOPHEN 10-325 MG PO TABS: 1.0000 | ORAL_TABLET | ORAL | 0 refills | Status: DC | PRN

## 2018-09-13 MED ORDER — ONDANSETRON 8 MG PO TBDP: 4.0000 mg | ORAL_TABLET | Freq: Four times a day (QID) | ORAL | 1 refills | Status: DC | PRN

## 2018-09-16 ENCOUNTER — Other Ambulatory Visit (HOSPITAL_COMMUNITY): Payer: Self-pay | Admitting: Nurse Practitioner

## 2018-09-16 DIAGNOSIS — C569 Malignant neoplasm of unspecified ovary: Secondary | ICD-10-CM

## 2018-09-16 DIAGNOSIS — R197 Diarrhea, unspecified: Secondary | ICD-10-CM

## 2018-09-19 ENCOUNTER — Other Ambulatory Visit (HOSPITAL_COMMUNITY): Payer: Self-pay | Admitting: *Deleted

## 2018-09-19 DIAGNOSIS — R197 Diarrhea, unspecified: Secondary | ICD-10-CM

## 2018-09-19 DIAGNOSIS — C569 Malignant neoplasm of unspecified ovary: Secondary | ICD-10-CM

## 2018-09-19 MED ORDER — DIPHENOXYLATE-ATROPINE 2.5-0.025 MG PO TABS: 1.0000 | ORAL_TABLET | Freq: Four times a day (QID) | ORAL | 2 refills | Status: DC | PRN

## 2018-09-22 ENCOUNTER — Other Ambulatory Visit (HOSPITAL_COMMUNITY): Payer: Self-pay | Admitting: *Deleted

## 2018-09-25 ENCOUNTER — Other Ambulatory Visit (HOSPITAL_COMMUNITY): Payer: Medicare HMO

## 2018-09-25 ENCOUNTER — Ambulatory Visit (HOSPITAL_COMMUNITY): Payer: Self-pay | Admitting: Hematology

## 2018-09-25 ENCOUNTER — Other Ambulatory Visit (HOSPITAL_COMMUNITY): Payer: Self-pay | Admitting: *Deleted

## 2018-09-25 DIAGNOSIS — C569 Malignant neoplasm of unspecified ovary: Secondary | ICD-10-CM

## 2018-09-25 MED ORDER — OXYCODONE HCL 5 MG PO TABS: 5.0000 mg | ORAL_TABLET | Freq: Four times a day (QID) | ORAL | 0 refills | Status: DC | PRN

## 2018-09-29 ENCOUNTER — Other Ambulatory Visit (HOSPITAL_COMMUNITY): Payer: Self-pay | Admitting: Hematology

## 2018-10-06 ENCOUNTER — Ambulatory Visit (HOSPITAL_COMMUNITY): Payer: Self-pay | Admitting: Hematology

## 2018-10-06 ENCOUNTER — Other Ambulatory Visit (HOSPITAL_COMMUNITY): Payer: Medicare HMO

## 2018-10-10 ENCOUNTER — Ambulatory Visit (HOSPITAL_COMMUNITY): Payer: Self-pay | Admitting: Hematology

## 2018-10-10 ENCOUNTER — Other Ambulatory Visit (HOSPITAL_COMMUNITY): Payer: Medicare HMO

## 2018-10-11 ENCOUNTER — Other Ambulatory Visit (HOSPITAL_COMMUNITY): Payer: Self-pay | Admitting: *Deleted

## 2018-10-11 DIAGNOSIS — C569 Malignant neoplasm of unspecified ovary: Secondary | ICD-10-CM

## 2018-10-12 MED ORDER — OXYCODONE-ACETAMINOPHEN 10-325 MG PO TABS: 1.0000 | ORAL_TABLET | ORAL | 0 refills | Status: DC | PRN

## 2018-10-18 ENCOUNTER — Other Ambulatory Visit (HOSPITAL_COMMUNITY): Payer: Self-pay | Admitting: Hematology

## 2018-10-18 ENCOUNTER — Other Ambulatory Visit (HOSPITAL_COMMUNITY): Payer: Self-pay | Admitting: *Deleted

## 2018-10-18 DIAGNOSIS — R197 Diarrhea, unspecified: Secondary | ICD-10-CM

## 2018-10-18 DIAGNOSIS — C569 Malignant neoplasm of unspecified ovary: Secondary | ICD-10-CM

## 2018-10-18 MED ORDER — OXYCODONE HCL 5 MG PO TABS: 5.0000 mg | ORAL_TABLET | Freq: Four times a day (QID) | ORAL | 0 refills | Status: DC | PRN

## 2018-10-31 DIAGNOSIS — R300 Dysuria: Secondary | ICD-10-CM | POA: Diagnosis not present

## 2018-11-01 DIAGNOSIS — R0602 Shortness of breath: Secondary | ICD-10-CM | POA: Diagnosis not present

## 2018-11-01 DIAGNOSIS — Z90710 Acquired absence of both cervix and uterus: Secondary | ICD-10-CM | POA: Diagnosis not present

## 2018-11-01 DIAGNOSIS — R0609 Other forms of dyspnea: Secondary | ICD-10-CM | POA: Diagnosis not present

## 2018-11-01 DIAGNOSIS — R609 Edema, unspecified: Secondary | ICD-10-CM | POA: Diagnosis not present

## 2018-11-01 DIAGNOSIS — Z79899 Other long term (current) drug therapy: Secondary | ICD-10-CM | POA: Diagnosis not present

## 2018-11-01 DIAGNOSIS — E119 Type 2 diabetes mellitus without complications: Secondary | ICD-10-CM | POA: Diagnosis not present

## 2018-11-01 DIAGNOSIS — R600 Localized edema: Secondary | ICD-10-CM | POA: Diagnosis not present

## 2018-11-01 DIAGNOSIS — Z886 Allergy status to analgesic agent status: Secondary | ICD-10-CM | POA: Diagnosis not present

## 2018-11-01 DIAGNOSIS — I10 Essential (primary) hypertension: Secondary | ICD-10-CM | POA: Diagnosis not present

## 2018-11-01 DIAGNOSIS — Z880 Allergy status to penicillin: Secondary | ICD-10-CM | POA: Diagnosis not present

## 2018-11-01 DIAGNOSIS — Z8543 Personal history of malignant neoplasm of ovary: Secondary | ICD-10-CM | POA: Diagnosis not present

## 2018-11-01 DIAGNOSIS — R52 Pain, unspecified: Secondary | ICD-10-CM | POA: Diagnosis not present

## 2018-11-01 DIAGNOSIS — Z888 Allergy status to other drugs, medicaments and biological substances status: Secondary | ICD-10-CM | POA: Diagnosis not present

## 2018-11-06 ENCOUNTER — Other Ambulatory Visit (HOSPITAL_COMMUNITY): Payer: Self-pay | Admitting: *Deleted

## 2018-11-06 DIAGNOSIS — C569 Malignant neoplasm of unspecified ovary: Secondary | ICD-10-CM

## 2018-11-06 MED ORDER — OXYCODONE-ACETAMINOPHEN 10-325 MG PO TABS: 1.0000 | ORAL_TABLET | ORAL | 0 refills | Status: DC | PRN

## 2018-11-06 MED ORDER — OXYCODONE HCL 5 MG PO TABS: 5.0000 mg | ORAL_TABLET | Freq: Four times a day (QID) | ORAL | 0 refills | Status: DC | PRN

## 2018-11-07 ENCOUNTER — Other Ambulatory Visit (HOSPITAL_COMMUNITY): Payer: Medicare HMO

## 2018-11-07 ENCOUNTER — Ambulatory Visit (HOSPITAL_COMMUNITY): Payer: Self-pay | Admitting: Hematology

## 2018-11-25 ENCOUNTER — Other Ambulatory Visit (HOSPITAL_COMMUNITY): Payer: Self-pay | Admitting: Nurse Practitioner

## 2018-11-25 DIAGNOSIS — G40901 Epilepsy, unspecified, not intractable, with status epilepticus: Secondary | ICD-10-CM

## 2018-11-28 ENCOUNTER — Other Ambulatory Visit (HOSPITAL_COMMUNITY): Payer: Self-pay | Admitting: *Deleted

## 2018-11-28 DIAGNOSIS — C569 Malignant neoplasm of unspecified ovary: Secondary | ICD-10-CM

## 2018-11-28 MED ORDER — OXYCODONE HCL 5 MG PO TABS: 5.0000 mg | ORAL_TABLET | Freq: Four times a day (QID) | ORAL | 0 refills | Status: DC | PRN

## 2018-12-04 ENCOUNTER — Other Ambulatory Visit (HOSPITAL_COMMUNITY): Payer: Self-pay | Admitting: *Deleted

## 2018-12-04 ENCOUNTER — Ambulatory Visit (HOSPITAL_COMMUNITY): Payer: Self-pay | Admitting: Hematology

## 2018-12-04 ENCOUNTER — Other Ambulatory Visit (HOSPITAL_COMMUNITY): Payer: Medicare HMO

## 2018-12-04 DIAGNOSIS — C569 Malignant neoplasm of unspecified ovary: Secondary | ICD-10-CM

## 2018-12-04 MED ORDER — OXYCODONE-ACETAMINOPHEN 10-325 MG PO TABS: 1.0000 | ORAL_TABLET | ORAL | 0 refills | Status: DC | PRN

## 2018-12-14 DIAGNOSIS — E119 Type 2 diabetes mellitus without complications: Secondary | ICD-10-CM | POA: Diagnosis not present

## 2018-12-14 DIAGNOSIS — Z79899 Other long term (current) drug therapy: Secondary | ICD-10-CM | POA: Diagnosis not present

## 2018-12-14 DIAGNOSIS — Z880 Allergy status to penicillin: Secondary | ICD-10-CM | POA: Diagnosis not present

## 2018-12-14 DIAGNOSIS — Z90710 Acquired absence of both cervix and uterus: Secondary | ICD-10-CM | POA: Diagnosis not present

## 2018-12-14 DIAGNOSIS — R197 Diarrhea, unspecified: Secondary | ICD-10-CM | POA: Diagnosis not present

## 2018-12-14 DIAGNOSIS — E860 Dehydration: Secondary | ICD-10-CM | POA: Diagnosis not present

## 2018-12-14 DIAGNOSIS — R7989 Other specified abnormal findings of blood chemistry: Secondary | ICD-10-CM | POA: Diagnosis not present

## 2018-12-14 DIAGNOSIS — Z888 Allergy status to other drugs, medicaments and biological substances status: Secondary | ICD-10-CM | POA: Diagnosis not present

## 2018-12-14 DIAGNOSIS — K529 Noninfective gastroenteritis and colitis, unspecified: Secondary | ICD-10-CM | POA: Diagnosis not present

## 2018-12-14 DIAGNOSIS — Z886 Allergy status to analgesic agent status: Secondary | ICD-10-CM | POA: Diagnosis not present

## 2018-12-14 DIAGNOSIS — Z8543 Personal history of malignant neoplasm of ovary: Secondary | ICD-10-CM | POA: Diagnosis not present

## 2018-12-15 DIAGNOSIS — R197 Diarrhea, unspecified: Secondary | ICD-10-CM | POA: Diagnosis not present

## 2018-12-15 DIAGNOSIS — E860 Dehydration: Secondary | ICD-10-CM | POA: Diagnosis not present

## 2018-12-27 ENCOUNTER — Other Ambulatory Visit (HOSPITAL_COMMUNITY): Payer: Self-pay | Admitting: *Deleted

## 2018-12-27 DIAGNOSIS — C569 Malignant neoplasm of unspecified ovary: Secondary | ICD-10-CM

## 2018-12-27 MED ORDER — OXYCODONE HCL 5 MG PO TABS: 5.0000 mg | ORAL_TABLET | Freq: Four times a day (QID) | ORAL | 0 refills | Status: AC | PRN

## 2018-12-29 ENCOUNTER — Other Ambulatory Visit: Payer: Self-pay

## 2019-01-01 ENCOUNTER — Ambulatory Visit (HOSPITAL_COMMUNITY): Payer: Self-pay | Admitting: Hematology

## 2019-01-01 ENCOUNTER — Other Ambulatory Visit (HOSPITAL_COMMUNITY): Payer: Medicare HMO

## 2019-01-04 ENCOUNTER — Other Ambulatory Visit: Payer: Self-pay

## 2019-01-04 ENCOUNTER — Emergency Department (HOSPITAL_COMMUNITY): Payer: Medicare HMO

## 2019-01-04 ENCOUNTER — Encounter (HOSPITAL_COMMUNITY): Payer: Self-pay | Admitting: Emergency Medicine

## 2019-01-04 ENCOUNTER — Emergency Department (HOSPITAL_COMMUNITY)
Admission: EM | Admit: 2019-01-04 | Discharge: 2019-01-04 | Disposition: A | Discharge status: Home / Self Care | Payer: Medicare HMO | Attending: Emergency Medicine | Admitting: Emergency Medicine

## 2019-01-04 DIAGNOSIS — E119 Type 2 diabetes mellitus without complications: Secondary | ICD-10-CM | POA: Insufficient documentation

## 2019-01-04 DIAGNOSIS — Z7984 Long term (current) use of oral hypoglycemic drugs: Secondary | ICD-10-CM | POA: Insufficient documentation

## 2019-01-04 DIAGNOSIS — R601 Generalized edema: Secondary | ICD-10-CM | POA: Diagnosis not present

## 2019-01-04 DIAGNOSIS — R0602 Shortness of breath: Secondary | ICD-10-CM | POA: Diagnosis not present

## 2019-01-04 DIAGNOSIS — C569 Malignant neoplasm of unspecified ovary: Secondary | ICD-10-CM | POA: Diagnosis not present

## 2019-01-04 DIAGNOSIS — L8943 Pressure ulcer of contiguous site of back, buttock and hip, stage 3: Secondary | ICD-10-CM | POA: Diagnosis not present

## 2019-01-04 DIAGNOSIS — R339 Retention of urine, unspecified: Secondary | ICD-10-CM | POA: Insufficient documentation

## 2019-01-04 DIAGNOSIS — I129 Hypertensive chronic kidney disease with stage 1 through stage 4 chronic kidney disease, or unspecified chronic kidney disease: Secondary | ICD-10-CM | POA: Insufficient documentation

## 2019-01-04 DIAGNOSIS — Z20828 Contact with and (suspected) exposure to other viral communicable diseases: Secondary | ICD-10-CM | POA: Insufficient documentation

## 2019-01-04 DIAGNOSIS — N183 Chronic kidney disease, stage 3 (moderate): Secondary | ICD-10-CM | POA: Diagnosis not present

## 2019-01-04 DIAGNOSIS — E1122 Type 2 diabetes mellitus with diabetic chronic kidney disease: Secondary | ICD-10-CM | POA: Diagnosis not present

## 2019-01-04 DIAGNOSIS — C799 Secondary malignant neoplasm of unspecified site: Secondary | ICD-10-CM

## 2019-01-04 DIAGNOSIS — R338 Other retention of urine: Secondary | ICD-10-CM

## 2019-01-04 LAB — BASIC METABOLIC PANEL
Anion gap: 12 (ref 5–15)
BUN: 26 mg/dL — ABNORMAL HIGH (ref 8–23)
CO2: 29 mmol/L (ref 22–32)
Calcium: 10.8 mg/dL — ABNORMAL HIGH (ref 8.9–10.3)
Chloride: 98 mmol/L (ref 98–111)
Creatinine, Ser: 1.29 mg/dL — ABNORMAL HIGH (ref 0.44–1.00)
GFR calc Af Amer: 49 mL/min — ABNORMAL LOW (ref >60)
GFR calc non Af Amer: 42 mL/min — ABNORMAL LOW (ref >60)
Glucose, Bld: 239 mg/dL — ABNORMAL HIGH (ref 70–99)
Potassium: 4.1 mmol/L (ref 3.5–5.1)
Sodium: 139 mmol/L (ref 135–145)

## 2019-01-04 LAB — CBC WITH DIFFERENTIAL/PLATELET
Abs Immature Granulocytes: 0.05 10*3/uL (ref 0.00–0.07)
Basophils Absolute: 0 10*3/uL (ref 0.0–0.1)
Basophils Relative: 0 %
Eosinophils Absolute: 0 10*3/uL (ref 0.0–0.5)
Eosinophils Relative: 0 %
HCT: 30.4 % — ABNORMAL LOW (ref 36.0–46.0)
Hemoglobin: 9.4 g/dL — ABNORMAL LOW (ref 12.0–15.0)
Immature Granulocytes: 1 %
Lymphocytes Relative: 9 %
Lymphs Abs: 1 10*3/uL (ref 0.7–4.0)
MCH: 31.2 pg (ref 26.0–34.0)
MCHC: 30.9 g/dL (ref 30.0–36.0)
MCV: 101 fL — ABNORMAL HIGH (ref 80.0–100.0)
Monocytes Absolute: 0.7 10*3/uL (ref 0.1–1.0)
Monocytes Relative: 6 %
Neutro Abs: 9.3 10*3/uL — ABNORMAL HIGH (ref 1.7–7.7)
Neutrophils Relative %: 84 %
Platelets: 93 10*3/uL — ABNORMAL LOW (ref 150–400)
RBC: 3.01 MIL/uL — ABNORMAL LOW (ref 3.87–5.11)
RDW: 15.2 % (ref 11.5–15.5)
WBC: 11 10*3/uL — ABNORMAL HIGH (ref 4.0–10.5)
nRBC: 0 % (ref 0.0–0.2)

## 2019-01-04 LAB — HEPATIC FUNCTION PANEL
ALT: 8 U/L (ref 0–44)
AST: 13 U/L — ABNORMAL LOW (ref 15–41)
Albumin: 2.9 g/dL — ABNORMAL LOW (ref 3.5–5.0)
Alkaline Phosphatase: 87 U/L (ref 38–126)
Bilirubin, Direct: 0.1 mg/dL (ref 0.0–0.2)
Indirect Bilirubin: 0.4 mg/dL (ref 0.3–0.9)
Total Bilirubin: 0.5 mg/dL (ref 0.3–1.2)
Total Protein: 6.6 g/dL (ref 6.5–8.1)

## 2019-01-04 LAB — URINALYSIS, ROUTINE W REFLEX MICROSCOPIC
Bilirubin Urine: NEGATIVE
Glucose, UA: NEGATIVE mg/dL
Hgb urine dipstick: NEGATIVE
Ketones, ur: NEGATIVE mg/dL
Leukocytes,Ua: NEGATIVE
Nitrite: NEGATIVE
Protein, ur: NEGATIVE mg/dL
Specific Gravity, Urine: 1.009 (ref 1.005–1.030)
pH: 8 (ref 5.0–8.0)

## 2019-01-04 LAB — SARS CORONAVIRUS 2 BY RT PCR (HOSPITAL ORDER, PERFORMED IN ~~LOC~~ HOSPITAL LAB): SARS Coronavirus 2: NEGATIVE

## 2019-01-04 LAB — TROPONIN I (HIGH SENSITIVITY)
Troponin I (High Sensitivity): 12 ng/L (ref <18)
Troponin I (High Sensitivity): 11 ng/L (ref <18)

## 2019-01-04 LAB — D-DIMER, QUANTITATIVE: D-Dimer, Quant: 2.43 ug/mL-FEU — ABNORMAL HIGH (ref 0.00–0.50)

## 2019-01-04 LAB — BRAIN NATRIURETIC PEPTIDE: B Natriuretic Peptide: 257 pg/mL — ABNORMAL HIGH (ref 0.0–100.0)

## 2019-01-04 MED ORDER — OXYCODONE-ACETAMINOPHEN 5-325 MG PO TABS
2.0000 | ORAL_TABLET | Freq: Once | ORAL | Status: AC
  Administered 2019-01-04: 2 via ORAL
  Filled 2019-01-04: qty 2

## 2019-01-04 MED ORDER — FENTANYL CITRATE (PF) 100 MCG/2ML IJ SOLN
50.0000 ug | Freq: Once | INTRAMUSCULAR | Status: AC
  Administered 2019-01-04: 50 ug via INTRAVENOUS
  Filled 2019-01-04: qty 2

## 2019-01-04 MED ORDER — FUROSEMIDE 20 MG PO TABS: 20.0000 mg | ORAL_TABLET | Freq: Every day | ORAL | 0 refills | Status: AC

## 2019-01-04 MED ORDER — IOHEXOL 350 MG/ML SOLN
80.0000 mL | Freq: Once | INTRAVENOUS | Status: AC | PRN
  Administered 2019-01-04: 80 mL via INTRAVENOUS

## 2019-01-04 MED ORDER — ONDANSETRON HCL 4 MG/2ML IJ SOLN
4.0000 mg | Freq: Once | INTRAMUSCULAR | Status: AC
  Administered 2019-01-04: 4 mg via INTRAVENOUS
  Filled 2019-01-04: qty 2

## 2019-01-04 NOTE — Progress Notes (Signed)
CSW attempted twice to contact family to provide medicare choice list of home health providers located in [place]. Will have 1st shift CSW follow up with family and to send out [facility] order.   [facility]  Clinical Social Worker  Ph: [phone]

## 2019-01-04 NOTE — Discharge Instructions (Signed)
Get help right away if: °Your shortness of breath gets worse. °You have shortness of breath when you are resting. °You feel light-headed or you faint. °You have a cough that is not controlled with medicines. °You cough up blood. °You have pain with breathing. °You have pain in your chest, arms, shoulders, or abdomen. °You have a fever. °You cannot walk up stairs or exercise the way that you normally do. °

## 2019-01-04 NOTE — ED Notes (Signed)
Pt with skin break down and yeast rash noted under fold of abdomen and pubis area. Pt states she does not have assistance with home health. Pt states her and her husband does her daily care at home. Pt states she does not have a shower or bath that works but pt is sponge bathed. PA notified of assessment findings. Orders received to consult social work.

## 2019-01-04 NOTE — ED Provider Notes (Signed)
[facility] Provider Note   CSN: [id number] Arrival date & time: 01/04/19  1111     History   Chief Complaint Chief Complaint  Patient presents with  . Shortness of Breath    HPI Kathleen Whitehead is a 68 y.o. 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  She presents emergency department chief complaint of shortness of breath.  Patient complains of bilateral peripheral edema which has been progressively worsening over several days.  She states that her legs are painful and she is having difficulty ambulating secondary to her severe lower extremity swelling.  She states she has had this in the past.  Today she became acutely short of breath.  Is worse when she lies flat, better when she sits up.  She has it is a past medical history of ovarian cancer and discontinued treatment recently secondary to her inability to tolerate the treatment regimen.  She also complains of urinary urgency and pain.  She states that she feels like she has to urinate badly but nothing seems to come out.  This has been progressively worsening since she has been waiting in the ER for the past 3 hours.  She denies unilateral leg swelling, pleuritic chest pain with breathing, hemoptysis or syncope.      HPI  Past Medical History:  Diagnosis Date  . Anemia   . Asthma   . Chemotherapy induced neutropenia ([place])   . Chemotherapy induced thrombocytopenia   . Diabetes mellitus without complication ([place])   . Endometrial cancer ([place])   . Hot flashes   . Hypertension    . Malignant neoplasm ([place])   . Malignant neoplasm of ovary ([place]) 04/30/2013  . Obesity   . Ovarian cancer ([place])   . Pancytopenia ([place])   . Peripheral artery disease ([place])   . Peripheral neuropathy   . Pneumonia 2015  . Port-A-Cath in place   . Seizures ([place]) 09/08/2016   ?? due to low magnesium    Patient Active Problem List   Diagnosis Date Noted  . Hypokalemia   . Acute metabolic encephalopathy   . Symptomatic anemia   . Acute chest pain 07/11/2018  . Chemotherapy-induced thrombocytopenia 07/11/2018  . Hypomagnesemia 07/11/2018  . Thrombocytopenia ([place])   . Acute diastolic CHF (congestive heart failure) ([place]) 01/24/2018  . CKD (chronic kidney disease) stage 3, GFR 30-59 ml/min (HCC) 01/24/2018  . Atypical chest pain   . SOB (shortness of breath) 01/22/2018  . Goals of care, counseling/discussion 09/21/2016  . Encounter for orogastric (OG) tube placement   . Encounter for intubation   . Dyspnea   . Status epilepticus ([place]) 09/08/2016  . Acute respiratory failure ([place])   . Chemotherapy-induced neuropathy ([place]) 01/15/2016  . Axillary lump 12/08/2015  . Blush 03/11/2015  . Acquired pancytopenia ([place]) 12/09/2014  . Presence of other vascular implants and grafts 04/18/2014  . Drug-induced low platelet count 12/04/2013  . Peripheral nerve disease 12/04/2013  . Chemotherapy-induced neutropenia ([place]) 08/24/2013  . Malignant neoplasm of endometrium ([place]) 06/11/2013  . Obesity (BMI 30-39.9) 05/07/2013  . Abdominal pain 04/30/2013  . Acute blood loss anemia 04/30/2013  . Type 2 diabetes mellitus ([place]) 04/30/2013  . BP (high blood  pressure) 04/30/2013  . 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 ABDOMINAL HYSTERECTOMY  2014     OB History   No obstetric history on file.      Home Medications    Prior to Admission medications   Medication Sig Start Date End Date Taking? Authorizing Provider   acetaminophen (TYLENOL) 500 MG tablet Take 500 mg by mouth every 6 (six) hours as needed for mild pain or moderate pain.     [provider]  ALPRAZolam Duanne Moron) 0.25 MG tablet Take 1 tablet (0.25 mg total) by mouth 2 (two) times daily as needed for anxiety. 08/18/18   Lockamy, Randi L, NP-C  alum & mag hydroxide-simeth (MYLANTA) 200-200-20 MG/5ML suspension Take 30 mLs by mouth every 6 (six) hours as needed for indigestion, heartburn or flatulence.    [provider]  calcium carbonate (TUMS SMOOTHIES) 750 MG chewable tablet Chew 1 tablet by mouth daily as needed for heartburn.    [provider]  cetirizine (ZYRTEC) 10 MG tablet Take 10 mg by mouth daily.     [provider]  Cholecalciferol (VITAMIN D PO) Take 1 tablet by mouth daily.    [provider]  cyanocobalamin 1000 MCG tablet Take 1,000 mcg by mouth daily.    [provider]  diphenoxylate-atropine (LOMOTIL) 2.5-0.025 MG tablet TAKE 1 TABLET BY MOUTH 4 (FOUR) TIMES DAILY AS NEEDED FOR DIARRHEA OR LOOSE STOOLS. 09/20/18   Lockamy, Randi L, NP-C  diphenoxylate-atropine (LOMOTIL) 2.5-0.025 MG tablet TAKE 1 TABLET BY MOUTH 4 (FOUR) TIMES DAILY AS NEEDED FOR DIARRHEA OR LOOSE STOOLS. 10/19/18   Derek Jack, MD  glipiZIDE (GLUCOTROL) 10 MG tablet Take 10 mg by mouth 2 (two) times daily.  01/23/16   [provider]  levETIRAcetam (KEPPRA) 500 MG tablet TAKE 1 TABLET BY MOUTH TWICE A DAY 11/28/18   Lockamy, Randi L, NP-C  loperamide (IMODIUM) 2 MG capsule Take 2 mg by mouth as needed for diarrhea or loose stools.     [provider]  metFORMIN (GLUCOPHAGE) 1000 MG tablet Take 1,000 mg by mouth 2 (two) times daily with a meal.     [provider]  Omega-3 Fatty Acids (FISH OIL) 1000 MG CAPS Take 1,000 mg by mouth daily.     [provider]  ondansetron (ZOFRAN-ODT) 8 MG disintegrating tablet Take 0.5 tablets (4 mg total) by mouth every 6 (six) hours as  needed for nausea or vomiting. 09/13/18   Lockamy, Randi L, NP-C  oxyCODONE (OXY IR/ROXICODONE) 5 MG immediate release tablet Take 1 tablet (5 mg total) by mouth every 6 (six) hours as needed for severe pain. 12/27/18   Derek Jack, MD  oxyCODONE-acetaminophen (PERCOCET) 10-325 MG tablet Take 1 tablet by mouth every 4 (four) hours as needed for pain. 12/04/18   Derek Jack, MD  Polyethylene Glycol 3350 (MIRALAX PO) Take 17 g by mouth daily as needed for moderate constipation.     [provider]  prochlorperazine (COMPAZINE) 10 MG tablet TAKE 1 TABLET (10 MG TOTAL) BY MOUTH EVERY 6 (SIX) HOURS AS NEEDED (NAUSEA OR VOMITING). 09/30/18   Derek Jack, MD  simethicone (GAS-X) 80 MG chewable tablet Chew 80 mg by mouth every 6 (six) hours as needed for flatulence.    [provider]    Family History Family History  Problem Relation Age of Onset  . Heart failure Mother   . Stroke Sister   . Cancer  Sister   . Diabetes Sister   . Leukemia Sister   . Diabetes Brother     Social History Social History   Tobacco Use  . Smoking status: Never Smoker  . Smokeless tobacco: Never Used  Substance Use Topics  . Alcohol use: No  . Drug use: No     Allergies   Diphenhydramine, Iodinated diagnostic agents, Duloxetine hcl, Ibuprofen, and Penicillins   Review of Systems Review of Systems  Ten systems reviewed and are negative for acute change, except as noted in the HPI.   Physical Exam Updated Vital Signs BP (!) 169/76   Pulse 92   Temp 98.4 F (36.9 C) (Oral)   Ht 5\' 9"  (1.753 m)   Wt 96.2 kg   SpO2 98%   BMI 31.31 kg/m   Physical Exam Vitals signs and nursing note reviewed.  Constitutional:      General: She is not in acute distress.    Appearance: She is well-developed. She is ill-appearing. She is not diaphoretic.  HENT:     Head: Normocephalic and atraumatic.  Eyes:     General: No scleral icterus.    Conjunctiva/sclera: Conjunctivae  normal.  Neck:     Musculoskeletal: Normal range of motion.  Cardiovascular:     Rate and Rhythm: Normal rate and regular rhythm.     Heart sounds: Normal heart sounds. No murmur. No friction rub. No gallop.   Pulmonary:     Effort: No respiratory distress.     Comments: Breathing is mildly labored, speaking in full sentences.  Breath sounds are distant and difficult to also auscultate secondary to body habitus Abdominal:     General: Bowel sounds are normal. There is no distension.     Palpations: Abdomen is soft. There is no mass.     Tenderness: There is no abdominal tenderness. There is no guarding.     Comments: Intertrigo present under abdominal pannus.  Tenderness in the suprapubic region  Musculoskeletal:     Right lower leg: Edema present.     Left lower leg: Edema present.     Comments: 3+ pitting edema of the entire leg bilaterally  Skin:    General: Skin is warm and dry.  Neurological:     Mental Status: She is alert and oriented to person, place, and time.  Psychiatric:        Behavior: Behavior normal.      ED Treatments / Results  Labs (all labs ordered are listed, but only abnormal results are displayed) Labs Reviewed  BASIC METABOLIC PANEL - Abnormal; Notable for the following components:      Result Value   Glucose, Bld 239 (*)    BUN 26 (*)    Creatinine, Ser 1.29 (*)    Calcium 10.8 (*)    GFR calc non Af Amer 42 (*)    GFR calc Af Amer 49 (*)    All other components within normal limits  SARS CORONAVIRUS 2 (HOSPITAL ORDER, [facility])  TROPONIN I (HIGH SENSITIVITY)  CBC WITH DIFFERENTIAL/PLATELET  TROPONIN I (HIGH SENSITIVITY)  BRAIN NATRIURETIC PEPTIDE  CBC WITH DIFFERENTIAL/PLATELET  D-DIMER, QUANTITATIVE (NOT AT [facility])  HEPATIC FUNCTION PANEL    EKG None  Radiology Dg Chest 2 View  Result Date: 01/04/2019 CLINICAL DATA:  Patient complaining of shortness of breath x 2 hours. Denies chest pain. Also complaining of  swelling to bilateral legs since yesterday. States she has ovarian cancer and has stopped chemo treatments.  Hx port a cath, diabetes, asthma shortness of breath EXAM: CHEST - 2 VIEW COMPARISON:  07/11/2018 FINDINGS: Port in the anterior chest wall with tip in distal SVC. Chronic elevation LEFT hemidiaphragm. Normal mediastinum and cardiac silhouette. Normal pulmonary vasculature. No evidence of effusion, infiltrate, or pneumothorax. No acute bony abnormality. IMPRESSION: No acute cardiopulmonary process. Electronically Signed   By: Suzy Bouchard M.D.   On: 01/04/2019 12:31    Procedures Procedures (including critical care time)  Medications Ordered in ED Medications  ondansetron (ZOFRAN) injection 4 mg (has no administration in time range)  fentaNYL (SUBLIMAZE) injection 50 mcg (has no administration in time range)     Initial Impression / Assessment and Plan / ED Course  I have reviewed the triage vital signs and the nursing notes.  Pertinent labs & imaging results that were available during my care of the patient were reviewed by me and considered in my medical decision making (see chart for details).  Clinical Course as of Jan 04 2037  Thu Jan 04, 2019  1510 Glucose(!): 239 [AH]  1625 WBC(!): 11.0 [AH]  1626 Patient with >600 ml urine in bladder.     [AH]  1632 Patient reports an unknown contrast allergy >40 years ago. 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 She normally takes percocet which helps with her pain.   [AH]  1806 Albumin(!): 2.9 [AH]    Clinical Course User Index [AH] Margarita Mail, PA-C       CC: Weakness, shortness of breath VS:  Vitals:   01/04/19 2000 01/04/19 2030 01/04/19 2100 01/04/19 2138  BP: 139/69 (!) 155/63 137/64   Pulse: 87 88 84   Resp:    16  Temp:    98.3 F (36.8 C)  TempSrc:    Oral  SpO2:      Weight:      Height:         BT:DVVOHYW is gathered by Patient  and EMR. DDX:The emergent differential diagnosis for shortness of breath includes, but is not limited to, Pulmonary edema, bronchoconstriction, Pneumonia, Pulmonary embolism, Pneumotherax/ Hemothorax, Dysrythmia, ACS.  The differential diagnosis of weakness includes but is not limited to neurologic causes (GBS, myasthenia gravis, CVA, MS, ALS, transverse myelitis, spinal cord injury, CVA, botulism, ) and other causes: ACS, Arrhythmia, syncope, orthostatic hypotension, sepsis, hypoglycemia, electrolyte disturbance, hypothyroidism, respiratory failure, symptomatic anemia, dehydration, heat injury, polypharmacy, malignancy.  Labs: I reviewed the labs which show hepatic function panel with low albumin BUN level, UA without infection, negative corona test, d-dimer elevated, BMP shows slightly elevated creatinine, elevated glucose and BUN.  CBC shows elevated white blood cell count, stable macrocytic anemia Imaging: I personally reviewed the images (2 view chest x-ray and CT angiogram of the chest) which show(s) worsening metastatic disease, no evidence of pulmonary embolus EKG:  EKG Interpretation  Date/Time:  Thursday January 04 2019 11:27:12 EDT Ventricular Rate:  85 PR Interval:  146 QRS Duration: 82 QT Interval:  328 QTC Calculation: 390 R Axis:   4 Text Interpretation:  Normal sinus rhythm Normal ECG Confirmed by Thayer Jew [phone]) on 01/05/2019 9:53:08 AM       MDM: Patient with metastatic cancer.  Seen and shared visit with Dr. Eulis Foster.  The patient does not wish to be admitted.  This is likely worsening of her disease.  She did have acute urinary retention.  She is not complaining  any severe back pain or weakness and I doubt that she is having any signs of cauda equina however patient may have some obstructive uropathy secondary to pelvic masses.  Did not evaluate her for this today.  She has a Foley cath in place and can follow-up with urology on Monday.  Of note the patient states that she lives in [place] which is in [place].  She has been living in her home without electricity or running water for several years when her water system broke.  She has a decubitus ulcer and her husband stuck together multiple Band-Aids to cover the wound.  It appears well-healing.  I believe our nursing staff called [employer]PS to do a welfare check with patient and her family member.  I also ordered home health for the patient as I do believe she will need it and may need palliative care consult.  Patient appears otherwise appropriate for discharge at this time. Patient disposition: Discharge Patient condition: Stable. The patient appears reasonably screened and/or stabilized for discharge and I doubt any other medical condition or other [facility] requiring further screening, evaluation, or treatment in the ED at this time prior to discharge. I have discussed lab and/or imaging findings with the patient and answered all questions/concerns to the best of my ability. I have discussed return precautions and OP follow up.      Final Clinical Impressions(s) / ED Diagnoses   Final diagnoses:  None    ED Discharge Orders    None       Margarita Mail, PA-C 01/05/19 2050    Daleen Bo, MD 01/05/19 2321

## 2019-01-04 NOTE — ED Provider Notes (Signed)
  Face-to-face evaluation   History: Presents for evaluation of shortness of breath, with leg swelling, making it difficult to walk.  She states that typically she walks at her home slowly using assistance of "furniture."  She has not seen her PCP or oncologist, for several months because of the pandemic.  She does not plan on getting additional chemotherapy because of "intolerance."  She denies fever, vomiting or chest pain.  Physical exam: Obese elderly female.  She appears depressed.  He is pale in appearance.  Abdomen is nondistended but very obese.  There is erythema of the skin folds of the abdominal wall.  No respiratory distress.  No dysarthria or aphasia.  Patient is lucid.  There is 3+ pitting edema of the lower legs, bilaterally.  Medical screening examination/treatment/procedure(s) were conducted as a shared visit with non-physician practitioner(s) and myself.  I personally evaluated the patient during the encounter    Daleen Bo, MD 01/05/19 2321

## 2019-01-04 NOTE — ED Triage Notes (Signed)
Patient complaining of shortness of breath x 2 hours. Denies chest pain. Also complaining of swelling to bilateral legs since yesterday. States she has ovarian cancer and has stopped chemo treatments.

## 2019-01-04 NOTE — ED Notes (Signed)
Patient was ambulatory with assistance from wheelchair to bed.   Patient then stated she had to use restroom, but refused to get back up due to her "legs hurt, her bed sore hurt, and her shoulder hurt".  Patient was placed on external wick

## 2019-01-04 NOTE — ED Notes (Signed)
Patient transported to CT 

## 2019-01-04 NOTE — Progress Notes (Signed)
Consult request has been received. CSW attempting to follow up at present time  Jamaiya Tunnell M. Zaccheus Edmister LCSWA Transitions of Care  Clinical Social Worker  Ph: [phone] 

## 2019-01-05 LAB — URINE CULTURE
Culture: NO GROWTH
Special Requests: NORMAL

## 2019-01-05 NOTE — Clinical Social Work Note (Signed)
Followed up on CSW consult last night for [facility] services.  Called patient today, who declined services.  I asked if I could make a follow up appointment with PCP.  Pt declined, saying she has upcoming appointment with cancer center on 14th.  Encoiuraged her to talk to either cancer center or PCP if she changes her mind about [facility] services.

## 2019-01-08 ENCOUNTER — Other Ambulatory Visit (HOSPITAL_COMMUNITY): Payer: Self-pay | Admitting: *Deleted

## 2019-01-08 DIAGNOSIS — C569 Malignant neoplasm of unspecified ovary: Secondary | ICD-10-CM

## 2019-01-08 MED ORDER — OXYCODONE-ACETAMINOPHEN 10-325 MG PO TABS: 1.0000 | ORAL_TABLET | ORAL | 0 refills | Status: AC | PRN

## 2019-01-10 ENCOUNTER — Encounter (HOSPITAL_COMMUNITY): Payer: Self-pay | Admitting: *Deleted

## 2019-01-10 NOTE — Progress Notes (Signed)
Dr. Delton Coombes received the following message from Dr. Louis Meckel.    "I saw this patient in clinic today for urinary retention. She was seen in the ED last week and noted to be in retention among other things. She doesn't look well, and is in a lot of pain from the pressure sore she has developed.   I opted to leave her catheter in because she was having incredible urgency and frequency and is essentially immobile. Her husband has a hard time getting her to the toilet.  I think she's best served with hospice. I am not sure how to set this up, but thought you guys probably have a system in place for this. Can you help initiate this process for this patient?  Then they could change the catheter at home so she doesn't have to come back to our clinic to get that done.  Thank you,  Burman Nieves, Urology"  I called and spoke with patient via telephone today.  I asked her if she recalls the conversation with Dr. Louis Meckel. She states that he told her that was his only option.  I have talked with [name] in the past and she was hesitant at that time to accept hospice care.  I asked her today if she was willing to allow them to come in and assume her comfort care.  She states that she is unable to walk, unable to get out of the chair/bed, unable to go to the bathroom by herself anymore or at times not at all.  She states that she knows her life is not of good quality anymore.  She does want [place] to send a referral to hospice for her.  I explained that they would assume her care and should be contacting her in a few days.  She verbalizes understanding and appreciation.

## 2019-01-16 ENCOUNTER — Ambulatory Visit (HOSPITAL_COMMUNITY): Payer: Self-pay | Admitting: Hematology

## 2019-01-16 ENCOUNTER — Other Ambulatory Visit (HOSPITAL_COMMUNITY): Payer: Medicare HMO

## 2019-01-24 ENCOUNTER — Other Ambulatory Visit (HOSPITAL_COMMUNITY): Payer: Self-pay | Admitting: Hematology

## 2019-02-06 ENCOUNTER — Encounter (HOSPITAL_COMMUNITY): Payer: Self-pay | Admitting: *Deleted

## 2019-02-06 NOTE — Progress Notes (Signed)
I received written notification from Hospice that patient passed away on 02-13-2019 in her home.

## 2019-02-20 ENCOUNTER — Other Ambulatory Visit (HOSPITAL_COMMUNITY): Payer: Self-pay | Admitting: Nurse Practitioner

## 2019-02-20 DIAGNOSIS — G40901 Epilepsy, unspecified, not intractable, with status epilepticus: Secondary | ICD-10-CM
